# Patient Record
Sex: Male | Born: 1950 | Race: Black or African American | Hispanic: No | Smoking: Former smoker
Health system: Southern US, Community
[De-identification: ages and names within clinical notes are randomized; demographics above are authoritative.]

## PROBLEM LIST (undated history)

## (undated) DIAGNOSIS — J449 Chronic obstructive pulmonary disease, unspecified: Secondary | ICD-10-CM

## (undated) DIAGNOSIS — H269 Unspecified cataract: Secondary | ICD-10-CM

## (undated) DIAGNOSIS — I6529 Occlusion and stenosis of unspecified carotid artery: Secondary | ICD-10-CM

## (undated) DIAGNOSIS — E785 Hyperlipidemia, unspecified: Secondary | ICD-10-CM

## (undated) DIAGNOSIS — D573 Sickle-cell trait: Secondary | ICD-10-CM

## (undated) DIAGNOSIS — I2510 Atherosclerotic heart disease of native coronary artery without angina pectoris: Secondary | ICD-10-CM

## (undated) DIAGNOSIS — J302 Other seasonal allergic rhinitis: Secondary | ICD-10-CM

## (undated) DIAGNOSIS — M1990 Unspecified osteoarthritis, unspecified site: Secondary | ICD-10-CM

## (undated) DIAGNOSIS — D649 Anemia, unspecified: Secondary | ICD-10-CM

## (undated) DIAGNOSIS — I10 Essential (primary) hypertension: Secondary | ICD-10-CM

## (undated) DIAGNOSIS — I219 Acute myocardial infarction, unspecified: Secondary | ICD-10-CM

## (undated) HISTORY — DX: Hyperlipidemia, unspecified: E78.5

## (undated) HISTORY — PX: HERNIA REPAIR: SHX51

## (undated) HISTORY — DX: Essential (primary) hypertension: I10

## (undated) HISTORY — DX: Unspecified cataract: H26.9

## (undated) HISTORY — DX: Occlusion and stenosis of unspecified carotid artery: I65.29

## (undated) HISTORY — PX: APPENDECTOMY: SHX54

## (undated) HISTORY — DX: Sickle-cell trait: D57.3

## (undated) HISTORY — DX: Other seasonal allergic rhinitis: J30.2

## (undated) HISTORY — PX: STENT PLACEMENT VASCULAR (ARMC HX): HXRAD1737

## (undated) HISTORY — DX: Unspecified osteoarthritis, unspecified site: M19.90

## (undated) HISTORY — PX: KNEE ARTHROSCOPY WITH ANTERIOR CRUCIATE LIGAMENT (ACL) REPAIR: SHX5644

---

## 1998-07-13 ENCOUNTER — Ambulatory Visit (HOSPITAL_COMMUNITY): Admission: RE | Admit: 1998-07-13 | Discharge: 1998-07-13 | Payer: Self-pay | Admitting: Family Medicine

## 1998-07-13 ENCOUNTER — Encounter: Payer: Self-pay | Admitting: Family Medicine

## 1999-01-05 ENCOUNTER — Encounter (HOSPITAL_BASED_OUTPATIENT_CLINIC_OR_DEPARTMENT_OTHER): Payer: Self-pay | Admitting: General Surgery

## 1999-01-06 ENCOUNTER — Ambulatory Visit (HOSPITAL_COMMUNITY): Admission: RE | Admit: 1999-01-06 | Discharge: 1999-01-06 | Payer: Self-pay | Admitting: General Surgery

## 2001-05-30 ENCOUNTER — Encounter: Payer: Self-pay | Admitting: Nephrology

## 2001-05-30 ENCOUNTER — Encounter: Admission: RE | Admit: 2001-05-30 | Discharge: 2001-05-30 | Payer: Self-pay | Admitting: Nephrology

## 2002-06-05 ENCOUNTER — Inpatient Hospital Stay (HOSPITAL_COMMUNITY): Admission: EM | Admit: 2002-06-05 | Discharge: 2002-06-07 | Payer: Self-pay | Admitting: Cardiology

## 2002-07-16 ENCOUNTER — Inpatient Hospital Stay (HOSPITAL_COMMUNITY): Admission: AD | Admit: 2002-07-16 | Discharge: 2002-07-17 | Payer: Self-pay | Admitting: Endocrinology

## 2002-07-17 ENCOUNTER — Encounter: Payer: Self-pay | Admitting: Endocrinology

## 2002-10-02 ENCOUNTER — Encounter: Admission: RE | Admit: 2002-10-02 | Discharge: 2002-10-02 | Payer: Self-pay | Admitting: Internal Medicine

## 2002-10-02 ENCOUNTER — Encounter: Payer: Self-pay | Admitting: Internal Medicine

## 2002-10-24 ENCOUNTER — Ambulatory Visit (HOSPITAL_BASED_OUTPATIENT_CLINIC_OR_DEPARTMENT_OTHER): Admission: RE | Admit: 2002-10-24 | Discharge: 2002-10-24 | Payer: Self-pay | Admitting: *Deleted

## 2002-10-24 ENCOUNTER — Encounter: Payer: Self-pay | Admitting: Emergency Medicine

## 2002-10-24 ENCOUNTER — Emergency Department (HOSPITAL_COMMUNITY): Admission: EM | Admit: 2002-10-24 | Discharge: 2002-10-24 | Payer: Self-pay | Admitting: Emergency Medicine

## 2003-04-01 ENCOUNTER — Encounter: Payer: Self-pay | Admitting: Emergency Medicine

## 2003-04-01 ENCOUNTER — Emergency Department (HOSPITAL_COMMUNITY): Admission: EM | Admit: 2003-04-01 | Discharge: 2003-04-01 | Payer: Self-pay | Admitting: Emergency Medicine

## 2003-04-02 ENCOUNTER — Encounter: Payer: Self-pay | Admitting: Orthopedic Surgery

## 2003-04-02 ENCOUNTER — Inpatient Hospital Stay (HOSPITAL_COMMUNITY): Admission: AD | Admit: 2003-04-02 | Discharge: 2003-04-06 | Payer: Self-pay | Admitting: Orthopedic Surgery

## 2003-09-03 ENCOUNTER — Inpatient Hospital Stay (HOSPITAL_COMMUNITY): Admission: EM | Admit: 2003-09-03 | Discharge: 2003-09-04 | Payer: Self-pay | Admitting: Emergency Medicine

## 2003-09-24 ENCOUNTER — Ambulatory Visit (HOSPITAL_COMMUNITY): Admission: RE | Admit: 2003-09-24 | Discharge: 2003-09-24 | Payer: Self-pay | Admitting: Cardiology

## 2006-09-06 ENCOUNTER — Ambulatory Visit: Payer: Self-pay | Admitting: Cardiology

## 2006-09-19 ENCOUNTER — Ambulatory Visit: Payer: Self-pay

## 2006-10-18 ENCOUNTER — Ambulatory Visit: Payer: Self-pay | Admitting: Cardiology

## 2007-01-29 ENCOUNTER — Ambulatory Visit (HOSPITAL_COMMUNITY): Admission: RE | Admit: 2007-01-29 | Discharge: 2007-01-29 | Payer: Self-pay | Admitting: Orthopedic Surgery

## 2007-05-17 ENCOUNTER — Emergency Department (HOSPITAL_COMMUNITY): Admission: EM | Admit: 2007-05-17 | Discharge: 2007-05-17 | Payer: Self-pay | Admitting: Emergency Medicine

## 2007-05-31 ENCOUNTER — Emergency Department (HOSPITAL_COMMUNITY): Admission: EM | Admit: 2007-05-31 | Discharge: 2007-06-01 | Payer: Self-pay | Admitting: Emergency Medicine

## 2007-09-17 ENCOUNTER — Emergency Department (HOSPITAL_COMMUNITY): Admission: EM | Admit: 2007-09-17 | Discharge: 2007-09-17 | Payer: Self-pay | Admitting: Emergency Medicine

## 2008-06-22 ENCOUNTER — Inpatient Hospital Stay (HOSPITAL_COMMUNITY): Admission: AD | Admit: 2008-06-22 | Discharge: 2008-06-25 | Payer: Self-pay | Admitting: Cardiology

## 2008-07-02 ENCOUNTER — Encounter (HOSPITAL_COMMUNITY): Admission: RE | Admit: 2008-07-02 | Discharge: 2008-09-14 | Payer: Self-pay | Admitting: Cardiology

## 2009-01-26 ENCOUNTER — Observation Stay (HOSPITAL_COMMUNITY): Admission: AD | Admit: 2009-01-26 | Discharge: 2009-02-03 | Payer: Self-pay | Admitting: Cardiology

## 2009-01-28 ENCOUNTER — Encounter: Payer: Self-pay | Admitting: Cardiology

## 2009-02-10 ENCOUNTER — Ambulatory Visit (HOSPITAL_COMMUNITY): Admission: RE | Admit: 2009-02-10 | Discharge: 2009-02-10 | Payer: Self-pay | Admitting: Cardiology

## 2009-06-07 ENCOUNTER — Observation Stay (HOSPITAL_COMMUNITY): Admission: AD | Admit: 2009-06-07 | Discharge: 2009-06-08 | Payer: Self-pay | Admitting: Cardiology

## 2009-06-07 ENCOUNTER — Inpatient Hospital Stay (HOSPITAL_BASED_OUTPATIENT_CLINIC_OR_DEPARTMENT_OTHER): Admission: RE | Admit: 2009-06-07 | Discharge: 2009-06-07 | Payer: Self-pay | Admitting: Cardiology

## 2010-03-31 ENCOUNTER — Encounter: Admission: RE | Admit: 2010-03-31 | Discharge: 2010-03-31 | Payer: Self-pay | Admitting: Cardiology

## 2010-10-09 ENCOUNTER — Encounter: Payer: Self-pay | Admitting: Cardiology

## 2010-12-23 LAB — PROTIME-INR
INR: 0.9 (ref 0.00–1.49)
INR: 3 — ABNORMAL HIGH (ref 0.00–1.49)
Prothrombin Time: 31 seconds — ABNORMAL HIGH (ref 11.6–15.2)

## 2010-12-23 LAB — COMPREHENSIVE METABOLIC PANEL
ALT: 38 U/L (ref 0–53)
ALT: 40 U/L (ref 0–53)
AST: 36 U/L (ref 0–37)
Albumin: 3.2 g/dL — ABNORMAL LOW (ref 3.5–5.2)
Albumin: 3.5 g/dL (ref 3.5–5.2)
Alkaline Phosphatase: 81 U/L (ref 39–117)
Alkaline Phosphatase: 86 U/L (ref 39–117)
GFR calc Af Amer: >60 mL/min (ref >60)
Glucose, Bld: 117 mg/dL — ABNORMAL HIGH (ref 70–99)
Potassium: 3.9 mEq/L (ref 3.5–5.1)
Potassium: 4 mEq/L (ref 3.5–5.1)
Sodium: 139 mEq/L (ref 135–145)
Sodium: 139 mEq/L (ref 135–145)
Total Protein: 6.1 g/dL (ref 6.0–8.3)
Total Protein: 6.6 g/dL (ref 6.0–8.3)

## 2010-12-23 LAB — CBC
HCT: 35.6 % — ABNORMAL LOW (ref 39.0–52.0)
Hemoglobin: 12.1 g/dL — ABNORMAL LOW (ref 13.0–17.0)
Hemoglobin: 14.3 g/dL (ref 13.0–17.0)
MCV: 88 fL (ref 78.0–100.0)
Platelets: 188 10*3/uL (ref 150–400)
RBC: 4.05 MIL/uL — ABNORMAL LOW (ref 4.22–5.81)
RDW: 14.9 % (ref 11.5–15.5)
WBC: 7.8 10*3/uL (ref 4.0–10.5)
WBC: 8.6 10*3/uL (ref 4.0–10.5)

## 2010-12-23 LAB — CARDIAC PANEL(CRET KIN+CKTOT+MB+TROPI)
CK, MB: 1.3 ng/mL (ref 0.3–4.0)
CK, MB: 1.6 ng/mL (ref 0.3–4.0)
CK, MB: 1.9 ng/mL (ref 0.3–4.0)
Total CK: 60 U/L (ref 7–232)
Total CK: 88 U/L (ref 7–232)

## 2010-12-23 LAB — LIPID PANEL
HDL: 29 mg/dL — ABNORMAL LOW (ref >39)
Total CHOL/HDL Ratio: 2.7 RATIO

## 2010-12-23 LAB — BRAIN NATRIURETIC PEPTIDE: Pro B Natriuretic peptide (BNP): 44 pg/mL (ref 0.0–100.0)

## 2010-12-27 LAB — RAPID URINE DRUG SCREEN, HOSP PERFORMED
Amphetamines: NOT DETECTED
Barbiturates: NOT DETECTED
Benzodiazepines: NOT DETECTED
Opiates: POSITIVE — AB

## 2010-12-27 LAB — BASIC METABOLIC PANEL
BUN: 8 mg/dL (ref 6–23)
CO2: 28 mEq/L (ref 19–32)
Calcium: 8.9 mg/dL (ref 8.4–10.5)
Chloride: 99 mEq/L (ref 96–112)
Creatinine, Ser: 0.89 mg/dL (ref 0.4–1.5)
Creatinine, Ser: 0.73 mg/dL (ref 0.4–1.5)
GFR calc Af Amer: >60 mL/min (ref >60)
GFR calc Af Amer: >60 mL/min (ref >60)
GFR calc non Af Amer: >60 mL/min (ref >60)
Potassium: 4.5 mEq/L (ref 3.5–5.1)

## 2010-12-27 LAB — COMPREHENSIVE METABOLIC PANEL
ALT: 23 U/L (ref 0–53)
BUN: 13 mg/dL (ref 6–23)
CO2: 28 mEq/L (ref 19–32)
Calcium: 9.8 mg/dL (ref 8.4–10.5)
Creatinine, Ser: 1.19 mg/dL (ref 0.4–1.5)
GFR calc non Af Amer: >60 mL/min (ref >60)
Glucose, Bld: 97 mg/dL (ref 70–99)

## 2010-12-27 LAB — CBC
HCT: 38.8 % — ABNORMAL LOW (ref 39.0–52.0)
HCT: 37.4 % — ABNORMAL LOW (ref 39.0–52.0)
MCHC: 33.7 g/dL (ref 30.0–36.0)
MCV: 86 fL (ref 78.0–100.0)
MCV: 87.1 fL (ref 78.0–100.0)
Platelets: 205 10*3/uL (ref 150–400)
Platelets: 204 10*3/uL (ref 150–400)
RBC: 4.51 MIL/uL (ref 4.22–5.81)
RBC: 4.29 MIL/uL (ref 4.22–5.81)
RDW: 13.3 % (ref 11.5–15.5)
WBC: 9.4 10*3/uL (ref 4.0–10.5)
WBC: 8.9 10*3/uL (ref 4.0–10.5)
WBC: 10.8 10*3/uL — ABNORMAL HIGH (ref 4.0–10.5)

## 2010-12-27 LAB — CARDIAC PANEL(CRET KIN+CKTOT+MB+TROPI)
CK, MB: 0.6 ng/mL (ref 0.3–4.0)
Relative Index: INVALID (ref 0.0–2.5)
Total CK: 106 U/L (ref 7–232)
Total CK: 81 U/L (ref 7–232)
Troponin I: 0.02 ng/mL (ref 0.00–0.06)
Troponin I: 0.02 ng/mL (ref 0.00–0.06)

## 2010-12-27 LAB — DIFFERENTIAL
Basophils Absolute: 0.1 10*3/uL (ref 0.0–0.1)
Basophils Relative: 1 % (ref 0–1)
Monocytes Relative: 10 % (ref 3–12)
Neutro Abs: 6.4 10*3/uL (ref 1.7–7.7)
Neutrophils Relative %: 68 % (ref 43–77)

## 2010-12-27 LAB — LIPASE, BLOOD
Lipase: 46 U/L (ref 11–59)
Lipase: 45 U/L (ref 11–59)

## 2010-12-27 LAB — LIPID PANEL
HDL: 59 mg/dL (ref >39)
Total CHOL/HDL Ratio: 2.4 RATIO
Triglycerides: 55 mg/dL (ref <150)

## 2010-12-27 LAB — PROTIME-INR
INR: 0.9 (ref 0.00–1.49)
Prothrombin Time: 12.7 seconds (ref 11.6–15.2)

## 2010-12-27 LAB — APTT: aPTT: 28 seconds (ref 24–37)

## 2010-12-27 LAB — HEPARIN LEVEL (UNFRACTIONATED): Heparin Unfractionated: 0.64 IU/mL (ref 0.30–0.70)

## 2010-12-27 LAB — AMYLASE: Amylase: 216 U/L — ABNORMAL HIGH (ref 27–131)

## 2011-01-31 NOTE — Discharge Summary (Signed)
NAME:  [name] [name], POTVIN NO.:  [id number]   MEDICAL RECORD NO.:  [id number]          PATIENT TYPE:  INP   LOCATION:  6529                         FACILITY:  [facility]   PHYSICIAN:  Mohan N. Sharyn Lull, M.D. DATE OF BIRTH:  1950/10/21   DATE OF ADMISSION:  06/22/2008  DATE OF DISCHARGE:  06/25/2008                               DISCHARGE SUMMARY   ADMITTING DIAGNOSES:  1. Unstable angina, rule out myocardial infarction, and rule out      progression of coronary artery disease.  2. Coronary artery disease.  3. History of percutaneous coronary intervention to left anterior      descending x2 in the past.  4. Uncontrolled hypertension.  5. Non-insulin-dependent diabetes mellitus, controlled by diet.  6. Hypercholesteremia.  7. Tobacco abuse.  8. 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   DISCHARGE DIAGNOSES:  1. Status post unstable angina.  2. Status post left cath and percutaneous transluminal coronary      angioplasty stenting to mid left anterior descending.  3. Minimally elevated troponin I probably secondary to plaque shift to      very small septal perforator which is less than 0.25 mm. I doubt      any significant myocardial infarction.  4. Coronary artery disease.  5. History of percutaneous coronary intervention to left anterior      descending x2 in the past.  6. Hypertension.  7. Glucose intolerance.  8. Hypercholesteremia.  9. History of tobacco abuse.  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   DISCHARGE MEDICATIONS:  1. Enteric-coated aspirin 325 mg 1 tablet daily.  2. Plavix 75 mg 1 tablet daily with food.  3. Toprol-XL 50 mg 1 tablet daily.  4. Ramipril 5 mg 1 capsule daily.  5. Crestor 20 mg 1 tablet daily.  6. Nitrolingual spray 0.4 mg sublingual use as directed.   DIET:  Low salt, low cholesterol.  The patient has been advised to avoid  sweets.   ACTIVITY:  Increase activity slowly.  Avoid any lifting, pushing, or  pulling for 48 hours.   Postcath and PTCA instructions have been given.  The patient will be  scheduled for phase 2 Cardiac Rehab as an outpatient.  Follow up with me  in 1 week.   CONDITION AT DISCHARGE:  Stable.   BRIEF HISTORY AND HOSPITAL COURSE:  Mr. [name] [name] is a 60 year old black  male with past medical history significant for coronary artery disease,  history of PCI to LAD x2 in the past, hypertension, non-insulin-  dependent diabetes mellitus controlled by diet, hypercholesteremia,  tobacco abuse, alcohol abuse, and history of marijuana and cocaine abuse  in the past.  He came to the office complaining of retrosternal and  precordial chest pain off and on grade 7/10 for the last few weeks  radiating to the left arm and neck.  He states initially did not seek  any medical attention, but as chest pain got worse, so decided to come  to the office.  Denies any shortness of breath.  Denies nausea,  vomiting, but complains of occasional diaphoresis.  Denies palpitation,  lightheadedness, or syncope.  States he has stopped all these  medications in January 2009, except he takes aspirin off and on.   PAST MEDICAL HISTORY:  As above.   PAST SURGICAL HISTORY:  He had right inguinal hernia repair in the past,  left knee and left foot surgery in the past.   ALLERGIES:  No known drug allergies.   MEDICATION AT HOME:  He was on aspirin, Plavix, metoprolol, and  simvastatin.   SOCIAL HISTORY:  He is separated, 7 children.  Smokes half pack per day  for 20 plus years.  Drinks hard liquor 3-4 times per week.  Smoked  marijuana and cocaine in the past and last use was 3-4 weeks ago.   FAMILY HISTORY:  Positive for coronary artery disease, hypertension, and  diabetes.   PHYSICAL EXAMINATION:  GENERAL:  He is alert, awake, and oriented x3, in  no acute distress.  VITAL SIGNS:  Blood pressure was 140/90 and pulse was 70.  HEENT:  Conjunctivae was pink.  NECK:  Supple.  No JVD.  No  bruit.  LUNGS:  Clear to auscultation without rhonchi or rales.  CARDIOVASCULAR:  S1 and S2 was normal.  There was soft S4 gallop and  systolic murmur.  ABDOMEN:  Soft.  Bowel sounds were present.  Nontender.  EXTREMITIES:  There is no clubbing, cyanosis, or edema.   LABORATORY DATA:  Chest x-ray showed no acute abnormalities.  EKG showed  normal sinus rhythm with early repolarization changes.  Postprocedure  EKG showed sinus bradycardia with early repolarization.  Repeat EKG  showed septal Q-waves on June 24, 2008.  On June 25, 2008, repeat  EKG showed normalization of septal Q-waves.  There is no evidence of  septal Q-waves.  There is normal R-wave progression and V1-V3 with early  repolarization changes and peak T-waves as before.   OTHER LABORATORIES:  His hemoglobin was 12.9, hematocrit 40, white count  of 8.3, and platelet count of 203,000.  Glucose was 120, BUN was 3,  creatinine 0.98, and potassium was 3.2.  Repeat potassium was 3.8,  glucose fasting was 96, BUN 6, and creatinine 1.03.  Cholesterol was  127, LDL 36, HDL of 42, and triglyceride 245.  His C-reactive protein  was 0.1.  His postprocedure CPK was 137, MB of 6.1, and troponin I was  0.57.  Today, his CPK is 122, MB has come down to 2.7, and troponin I  still minimally elevated at 0.75.   BRIEF HOSPITAL COURSE:  The patient was admitted to telemetry unit.  MI  was ruled out by serial enzymes and EKG.  The patient subsequently  underwent left cath and PTCA stenting to mid LAD as per procedure  report.  The patient tolerated procedure well.  There were no  complications.  The patient did have some chest pain postprocedure.  The  patient had a very small septal perforator, which was less than 0.25 mm,  and had sluggish flow in one of the septal perforator postprocedure with  elevation of minimally elevated troponin I with normal CPKs.  EKG  initially showed septal Q-waves, which resolved next day.  The patient  has  been ambulating in hallway without any problems.  His groin is  stable with no evidence of hematoma or bruit.  The patient will be  discharged home on above medications and will be followed up in my  office in 1 week.  The patient will be scheduled for phase 2 Cardiac  Rehab as an outpatient.  The patient was concerned for smoking cessation  and drug cessation to which he agrees, and the patient was started on  Nicoderm patches, which he is tolerating well.  The patient will be  discharged home on above medications and will be followed up in my  office next week.      Eduardo Osier. Sharyn Lull, M.D.  Electronically Signed     MNH/MEDQ  D:  06/25/2008  T:  06/25/2008  Job:  [id number]

## 2011-01-31 NOTE — Discharge Summary (Signed)
NAME:  [name] [name], [name] [name] NO.:  [id number]   MEDICAL RECORD NO.:  [id number]          PATIENT TYPE:  INP   LOCATION:  3707                         FACILITY:  [facility]   PHYSICIAN:  Mohan N. Sharyn Lull, M.D. DATE OF BIRTH:  [birth year]   DATE OF ADMISSION:  01/28/2009  DATE OF DISCHARGE:  02/03/2009                               DISCHARGE SUMMARY   ADMITTING DIAGNOSES:  Chest pain, history of recent cocaine abuse rule  out myocardial infarction, coronary artery disease, history of  percutaneous coronary intervention to left anterior descending in the  past, hypertension, hypercholesteremia, tobacco abuse, alcohol abuse.   DISCHARGED DIAGNOSES:  Status post chest pain myocardial infarction  ruled out, negative Persantine Myoview, history of recent cocaine abuse,  resolving bilateral pneumonia, probable resolving pancreatitis, coronary  artery disease, stable, hypertension, hypercholesteremia, history of  tobacco abuse and cocaine abuse and alcohol abuse.   DISCHARGE MEDICATIONS:  1. Enteric-coated aspirin 81 mg 1 tablet daily.  2. Plavix 75 mg 1 tablet daily.  3. Cardizem CD 180 mg 1 capsule daily in the morning.  4. Ramipril 5 mg 1 capsule daily.  5. Crestor 20 mg 1 tablet daily.  6. Nitrostat 0.4 mg sublingual use as directed.  7. Avelox 400 mg 1 tablet daily for seven more days.   DIET:  Low salt, low cholesterol.   ACTIVITY:  Increase activity as tolerated.   FOLLOW UP:  With me in 1 week.   CONDITION AT DISCHARGE:  Stable.   BRIEF HISTORY AND HOSPITAL COURSE:  [name] is a 61 year old black  male with past medical history significant for coronary artery disease  status post PCI to LAD, hypertension, hypercholesteremia, tobacco abuse,  alcohol abuse, history of marijuana and cocaine abuse.  He came to the  office complaining of left-sided chest pain off and on radiating to the  left arm associated with mild shortness of breath.  He states the chest  pain  occasionally increases with deep breathing.  He smoked cocaine last  week and denies any nausea, vomiting, diaphoresis.  Denies palpitation,  lightheadedness or syncope.  Denies PND, orthopnea, leg swelling.   PAST MEDICAL HISTORY:  As above.   PAST SURGICAL HISTORY:  He had left knee surgery in the past and right  inguinal hernia repair in the past and PTCA stenting to LAD in the past.   ALLERGIES:  No known drug allergies.   MEDICATION AT HOME:  He was on:  1. Enteric-coated aspirin 325 mg p.o. daily.  2. Plavix 75 mg p.o. daily.  3. Toprol-XL 50 mg p.o. daily.  4. Ramipril 5 mg p.o. daily.  5. Crestor 20 mg p.o. daily.  6. Nitrostat sublingual p.r.n.   SOCIAL HISTORY:  He is married, 7 children.  Smoked half pack per day  for 20+ years.  Drinks hard liquor on weekends and snorted cocaine last  weekend.  He works as a Engineer, civil (consulting).   FAMILY HISTORY:  Positive for coronary artery disease.   PHYSICAL EXAMINATION:  GENERAL:  He is alert, awake, oriented x3 in no  acute distress.  VITAL SIGNS:  Blood  pressure is 140/84, pulse was 98 and regular.  HEENT:  Conjunctivae was pink.  NECK:  Supple, no JVD.  LUNGS:  Clear to auscultation without rhonchi or rales.  CARDIOVASCULAR:  S1 and S2 was normal.  There was soft systolic murmur  and S4 gallop.  ABDOMEN:  Soft.  Bowel sounds were present, nontender.  EXTREMITIES:  There is no clubbing, cyanosis or edema.   LABORATORY DATA:  His hemoglobin was 13.1, hematocrit 39, white count of  12.2.  Three sets of cardiac enzymes were normal.  Sodium was 139,  potassium 4.3, glucose 97, BUN 13, creatinine 1.19.  Urine drug screen  was negative for cocaine.  His lipase was 34, amylase was slightly  elevated to 15.  Repeat CBC on Feb 01, 2009 hemoglobin 13.1, hematocrit  38.8, white count of 9.4.  His chest x-ray on Jan 26, 2009 showed very  low lung volumes with vascular crowding, and bibasilar atelectasis.  The  patient had a CT of abdomen which  suggested left greater than right  lower lobe collapse with consolidation with atelectasis and small left  parapneumonic effusion suggestive of infection.  CT of the abdomen  otherwise was normal.  CT of pelvis was normal.  Repeat x-ray on Jan 30, 2009 showed left lower lobe opacity which represents combination of  airspace disease and atelectasis and also platelike atelectasis in the  right lower lobe.  Repeat x-ray  yesterday on Feb 02, 2009 showed  significant improvement in bibasilar airspace opacities compared to  prior exam only minimal residual subsegmental atelectasis noted.  Nearly  resolved left pleural effusion.  Persantine Myoview on Jan 28, 2009  showed no evidence of myocardial ischemia or infarction, normal left  ventricular ejection fraction of 64% with normal wall motion.   BRIEF HOSPITAL COURSE:  The patient was admitted to telemetry unit.  The  patient had recurrent episodes of chest pain and was transferred to CCU.  MI was ruled out by serial enzymes and EKG.  The patient subsequently  underwent Persantine Myoview which showed no evidence of reversible  ischemia as above.  The patient developed abdominal pain and left-sided  pleuritic chest pain, CT of the abdomen and pelvis and chest x-ray  showed bilateral consolidation.  Was started on IV antibiotics with  improvement in his symptoms.  The patient remained afebrile  during the hospital stay.  IV antibiotics has been switched to oral  Avelox.  The patient will be discharged home on above medications and  will be followed up in my office in 1 week and repeat the chest x-ray as  an outpatient in 1 week.  The patient has been advised to stay away from  smoking and cocaine which he agrees.      Eduardo Osier. Sharyn Lull, M.D.  Electronically Signed     MNH/MEDQ  D:  02/03/2009  T:  02/03/2009  Job:  [id number]

## 2011-01-31 NOTE — Op Note (Signed)
NAME:  [name] [name], [name] [name] NO.:  [id number]   MEDICAL RECORD NO.:  [id number]          PATIENT TYPE:  INP   LOCATION:  6529                         FACILITY:  [facility]   PHYSICIAN:  Mohan N. Sharyn Lull, M.D. DATE OF BIRTH:  Feb 13, 1951   DATE OF PROCEDURE:  06/23/2008  DATE OF DISCHARGE:  04/24/2008                               OPERATIVE REPORT   PROCEDURE:  1. Left cardiac cath with selective left and right coronary      angiography.  Left ventricle graft via right groin using Judkins      technique.  2. Successful percutaneous transluminal coronary angioplasty to mid      left anterior descending using 2.5 x 8 mm long VOYAGER balloon.  3. Successful deployment of 3.0 x 13 mm long CYPHER drug-eluting stent      in mid left anterior descending.  4. Successful postdilatation of the stent using 3.25 x 12 mm long [place]      VOYAGER balloon.   INDICATION FOR THE PROCEDURE:  [name] [name] is a 59 year old black male  with past medical history significant for coronary artery disease status  post PTCA stenting to LAD x2 in the past.  Hypertension, non-insulin-  dependent diabetes mellitus controlled by diet, hypercholesteremia,  tobacco abuse, alcohol abuse, history of marijuana and cocaine abuse in  the past.  He came to the office complaining of retrosternal and  precordial chest pain off and on, grade 7/10 for the last few weeks,  radiating to the left arm and neck.  He states initially did not seek  any medical attention, but as chest pain got worse and more frequent, so  decided to come to the office.  Denies any shortness of breath.  Denies  nausea or vomiting, but complains of occasional diaphoresis.  Denies  palpitation, lightheadedness, or syncope.  He states he stopped all his  medications in January 2009, now only occasionally he takes aspirin.   PAST MEDICAL HISTORY:  As above.   PAST SURGICAL HISTORY:  He had right inguinal hernia repair in the past.  Had left knee  surgery in the past and left foot surgery in the past.   ALLERGIES:  No known drug allergies.   MEDICATIONS AT HOME:  He was on aspirin, Plavix, metoprolol, and  simvastatin, which he stops.   SOCIAL HISTORY:  He is separated.  Has 7 children.  Smoked half pack per  day for 20 plus years and drinks hard liquor 3-4 times per week.  Smoked  marijuana and cocaine 3-4 weeks ago.   FAMILY HISTORY:  Positive for coronary artery disease, hypertension, and  diabetes.   PHYSICAL EXAMINATION:  GENERAL:  He was alert, awake, and oriented x3 in  no acute distress.  VITAL SIGNS:  Blood pressure was 140/90 and pulse was 70, regular.  EYES:  Conjunctivae was pink.  NECK:  Supple.  No JVD.  No bruit.  LUNGS:  Clear to auscultation without rhonchi or rales.  CARDIOVASCULAR:  S1 and S2 was normal.  There was soft systolic murmur  and S4 gallop.  ABDOMEN:  Soft.  Bowel  sounds were present.  Nontender.  EXTREMITIES:  There was no clubbing, cyanosis, or edema.   BRIEF HOSPITAL COURSE:  The patient was admitted to telemetry unit, MI  was ruled out by serial enzymes and EKG.  Discussed with the patient at  length regarding non-invasive stress testing versus left cath possible  PTCA stenting, its risks and benefits, i.e., death, MI, stroke, need for  emergency CABG, risk of restenosis, local vascular complications, etc.,  and consented for the procedure.   PROCEDURE:  After obtaining the informed consent, the patient was  brought to the cath lab and was placed on fluoroscopic table.  Right  groin was prepped and draped in usual fashion.  Xylocaine 2% was used  for local anesthesia in the right groin.  With the help of thin wall  needle, 6-French arterial sheath was placed.  The sheath was aspirated  and flushed.  Next, 6-French left Judkins catheter was advanced over the  wire under fluoroscopy guidance up to the ascending aorta.  Wire was  pulled out, the catheter was aspirated and connected to the  manifold.  The catheter was further advanced and engaged into left coronary ostium.  Multiple views of the left system were taken.  Next, catheter was  disengaged and was pulled out over the wire and was placed with 6-  [place].  Right Judkins catheter, which was advanced over the wire under  fluoroscopy guidance up to the ascending aorta.  The wire was pulled  out.  The catheter was aspirated and connected to the manifold.  The  catheter was further advanced across the aortic valve into the LV.  LV  pressures were recorded.  Next, LV graft was 130 degree RAO position.  Post angiographic pressures were recorded from LV and then pull back  pressures were recorded from the aorta.  There was no gradient across  the aortic valve.  Next, a pigtail catheter was pulled out over the  wire.  Sheaths were aspirated and flushed.   FINDINGS:  LV showed good LV systolic function.  EF of 55-60%.  Left  main was patent.  LAD has 75-80% mid stenosis at the distal edge of the  stent with haziness with TIMI grade 3 distal flow.  Diagonal 1 to 3 were  very-very small.  [place] was moderate sized, which was patent.  Left  circumflex was patent.  OM1 and OM2 were very very small.  OM3 was  moderate sized, which has 20% proximal stenosis.  OM4 was moderate  sized, which was patent.  RCA was patent.   INTERVENTIONAL PROCEDURES:  Successful PTCA to mid LAD was done using  2.5 x 8 mm long VOYAGER balloon for predilatation and then 3.0 x 13 mm  long CYPHER drug-eluting stent was deployed at 13 atmospheric pressure  and mid LAD overlapping over the distal edge of the stent.  The stent  was postdilated using 3.25 x 12 mm long [place] VOYAGER balloon going up to  18 atmospheric pressure.  Lesion was dilated from 75-80% to 0% residual  with excellent TIMI grade 3 distal flow without evidence of dissection  or distal embolization.  The patient received weight-based Angiomax and  300 mg of Plavix during the procedure.  The  patient tolerated the  procedure well.  There were no complications.  The patient was  transferred to recovery room in stable condition.      [name] [name]. Sharyn Lull, M.D.  Electronically Signed     MNH/MEDQ  D:  06/23/2008  T:  06/24/2008  Job:  [id number]   cc:   Cath Lab

## 2011-02-03 NOTE — Cardiovascular Report (Signed)
NAME:  [name] [name], [name] [name] NO.:  [id number]   MEDICAL RECORD NO.:  [id number]                   PATIENT TYPE:  INP   LOCATION:  4706                                 FACILITY:  [facility]   PHYSICIAN:  Salvadore Farber, M.D. [facility]         DATE OF BIRTH:  [birth year]   DATE OF PROCEDURE:  07/16/2002  DATE OF DISCHARGE:                              CARDIAC CATHETERIZATION   PROCEDURES:  Coronary angiography, left heart catheterization, left  ventriculography.   INDICATIONS:  The patient is a 60 year old gentleman, status post urgent  stenting of his mid LAD on June 05, 2002. He now presents with  recurrent chest pain which has culminated in ongoing left-sided chest  discomfort today.  Electrocardiogram demonstrates anterior ST elevations,  which are unchanged from his convalescent electrocardiogram demonstrating  normal early repolarization.  Due to his ongoing chest pain and recent  coronary stent, he is referred for emergent diagnostic angiography.   DIAGNOSTIC TECHNIQUE:  Informed consent was obtained. Under 1% lidocaine  local anesthesia, a 6 French sheath was placed in the right femoral artery  using the modified Seldinger technique.  Diagnostic angiography and  ventriculography were performed using JL4, JR4, and pigtail catheters.  The  arteriotomy was closed using a 6 [place] Perclose device.  The patient  tolerated the procedure well and was transferred to the holding room in  stable condition.   COMPLICATIONS:  None.   FINDINGS:  1. Left main:  Angiographically normal.  2. LAD:  The LAD is a very large vessel giving rise to a single large     diagonal branch.  The mid LAD stent is widely patent.  There is mild     tapering of the vessel leading into the stent which is unchanged compared     to his prior angiogram. This tapering did not change with the     administration of intracoronary nitroglycerin.  3. Circumflex:  The circumflex is a  moderate sized vessel giving rise to two     obtuse marginal branches.  It is angiographically normal.  4. RCA:  The RCA is the dominant vessel.  It is angiographically normal.  5. EF equaled 57% without regional wall motion abnormality.  6. LV:  106/3/8.  7. No aortic stenosis or mitral regurgitation.    IMPRESSION/PLAN:  The patient has a widely patent left anterior descending  stent and no other coronary artery disease.  We will admit him cycle serial  enzymes to rule out transient thrombosis resulting in myocardial infarction.  I think this very unlikely and he likely has a noncardiac etiology to his  chest pain syndrome.                                                Salvadore Farber, M.D.  [facility]    WED/MEDQ  D:  07/16/2002  T:  07/17/2002  Job:  [id number]   cc:   Stacie Glaze, M.D. [facility]   Sean A. Everardo All, M.D. [facility]

## 2011-02-03 NOTE — Cardiovascular Report (Signed)
NAME:  [name] [name], [name] [name] NO.:  [id number]   MEDICAL RECORD NO.:  [id number]                   PATIENT TYPE:  INP   LOCATION:  6524                                 FACILITY:  [facility]   PHYSICIAN:  Jonelle Sidle, M.D. [facility]        DATE OF BIRTH:  1951/05/28   DATE OF PROCEDURE:  09/03/2003  DATE OF DISCHARGE:                              CARDIAC CATHETERIZATION   PRIMARY CARE PHYSICIAN:  Dr. Cliffton Asters CARDIOLOGY:  Dr. Samule Ohm   INDICATIONS:  Mr. Monnier is a 60 year old male with known coronary artery  disease status post presentation with acute coronary syndrome back in  September 2003 with resulting stent placement to the mid left anterior  descending.  He represented in October of 2003 with recurrent chest pain and  was found to have a patent left anterior descending stent site.  He has had  difficulties with medication noncompliance over the last several months and  presents now with progressive chest discomfort culminating in an episode of  syncope over the last few days and more intense chest pain today.  He is  referred urgently to the cardiac catheterization laboratory to assess his  coronary anatomy.   PROCEDURES PERFORMED:  1. Left heart catheterization.  2. Selective coronary angiography.  3. Left ventriculography.   ACCESS AND EQUIPMENT:  The area about the right femoral artery was  anesthetized with 1% lidocaine and a 6-French sheath was placed in the right  femoral artery via the modified Seldinger technique.  Standard preformed 6-  [place] and JR4 catheters were used for selective coronary angiography  and an angled pigtail catheter was used for left heart catheterization, left  ventriculography.  All exchanges were made over a wire and the patient  tolerated the procedure well without immediate complications.   HEMODYNAMICS:  1. Left ventricle 112/12 mmHg.  2. Aorta 112/71 mmHg.   ANGIOGRAPHIC FINDINGS:  1. The left main  coronary artery shows no significant flow limiting coronary     atherosclerosis.  2. The left anterior descending has an 80% mid vessel stenosis just proximal     to the previous Cypher stent.  No other major luminal obstructions are     noted.  There was no significant change in this stenosis following 150     mcg of intracoronary nitroglycerin.  3. The circumflex coronary artery has two obtuse marginal branches, the     first of which has a proximal 30% stenosis.  4. The right coronary artery is dominant and has no significant flow     limiting coronary atherosclerosis.   LEFT VENTRICULOGRAPHY:  Left ventriculography was performed in the RAO  projection revealing an ejection fraction of approximately 50-55%.   DIAGNOSES:  1. Single vessel obstructive coronary artery disease with an 80% stenosis     just proximal to the previous Cypher stent in the mid left anterior     descending.  There was no  significant change with intracoronary     nitroglycerin.  2. Left ventricular ejection fraction of approximately 50-55%.   RECOMMENDATIONS:  I discussed the films with Dr. Juanda Chance.  Will plan  percutaneous intervention to address the left anterior descending stenosis.                                               Jonelle Sidle, M.D. [facility]    SGM/MEDQ  D:  09/03/2003  T:  09/04/2003  Job:  [id number]

## 2011-02-03 NOTE — Discharge Summary (Signed)
   NAME:  [name], [name] NO.:  [id number]   MEDICAL RECORD NO.:  [id number]                   PATIENT TYPE:  INP   LOCATION:  5027                                 FACILITY:  [facility]   PHYSICIAN:  Burnard Bunting, M.D.                 DATE OF BIRTH:  11/27/1950   DATE OF ADMISSION:  04/02/2003  DATE OF DISCHARGE:  04/06/2003                                 DISCHARGE SUMMARY   DISCHARGE DIAGNOSES:  Left foot puncture wound and infection.   SECONDARY DIAGNOSIS:  None.   OPERATIONS AND PROCEDURES:  Left foot incision and drainage April 02, 2003.   CONSULTANTS:  Infectious disease.   HOSPITAL COURSE:  [name] [name] is a 60 year old patient who received a left  puncture wound two days prior to admission. He reports increased swelling  and pain. MRI is consistent with cellulitis and dorsal fluid. The patient  was admitted to the orthopedic service and taken to the OR on April 02, 2003.  At that time, he underwent right foot incision and drainage. He tolerated  the procedure well without immediate complications. Infectious disease  consultation was obtained. Cipro was started. The patient defervesced and  did in recovery. The dorsal wound was left open and packed. Packing was  changed beginning postoperative day #2. The patient is discharged home on  April 06, 2003 to be on Cipro for two weeks.   DISCHARGE MEDICATIONS:  Include previous medications plus Vicodin for pain.   DISCHARGE INSTRUCTIONS:  He is to follow up with me in five days for recheck  on the incision. He is to maintain his weight bearing on heel only.                                                Burnard Bunting, M.D.    GSD/MEDQ  D:  04/28/2003  T:  04/29/2003  Job:  [id number]

## 2011-02-03 NOTE — H&P (Signed)
NAME:  [name] [name], LONGSHORE NO.:  [id number]   MEDICAL RECORD NO.:  [id number]                   PATIENT TYPE:  INP   LOCATION:  3742                                 FACILITY:  [facility]   PHYSICIAN:  Salvadore Farber, M.D. [facility]         DATE OF BIRTH:  12-04-1950   DATE OF ADMISSION:  06/05/2002  DATE OF DISCHARGE:                                HISTORY & PHYSICAL   CHIEF COMPLAINT:  Acute anterior myocardial infarction.   HISTORY OF PRESENT ILLNESS:  The patient is a 60 year-old man whose has had  substernal chest pain off and on over the past three weeks.  This has  radiated into his left arm.  It has not been associated with dizziness,  palpitations or nausea.  He was referred by his primary care doctor for a  stress Cardiolite today.  After the rest images were obtained, the patient  developed chest pain similar to prior. An electrocardiogram demonstrated ST  elevations in the inferior and anterior leads.  He was therefore referred  urgently for catheterization.  He was brought from the office to the [facility] laboratory by EMS.  Upon arrival in the catheterization  laboratory he was pain free, but had had chest discomfort in the ambulance.   PAST MEDICAL HISTORY:  1. Status post left knee reconstruction.  2. Hernia surgery.   MEDICATIONS:  None.   ALLERGIES:  No known drug allergies.   SOCIAL HISTORY:  The patient is married and lives with his wife in  [place]. He works as a Engineer, civil (consulting) at the [employer]  He smokes  one pack per day. He uses alcohol occasionally.  He denies illicit drug use.   FAMILY HISTORY:  Mother is alive at 61 with coronary artery disease.  His  father died at 82 with coronary artery disease.   REVIEW OF SYMPTOMS:  Negative in detail except as above.   PHYSICAL EXAMINATION:  GENERAL:  He is in moderate distress due to pain  though is not dyspneic or diaphoretic.  VITAL SIGNS: Heart rate 55, blood  pressure 106/67, weight 152 pounds.  HEENT:  He has no jugular venous distention.  LUNGS:  Clear to auscultation and percussion bilaterally.  He has a regular  rate and rhythm without murmurs, rubs or S3.  There is an S4.  ABDOMEN:  Soft, non-tender and non-distended.  There is no  hepatosplenomegaly.  Bowel sounds are normal.  EXTREMITIES:  Warm without cyanosis, clubbing or edema.   Electrocardiogram demonstrates sinus bradycardia at 55 beats per minute with  ST elevations in leads V2 to V6 as well as 2 and AVF.   LABORATORY DATA:  Stat laboratories obtained in the cardiac catheterization  laboratory are remarkable for a sodium of 139, potassium 4.1, creatinine  0.9.  Hematocrit 44.0.   IMPRESSION/PLAN:  The patient was taken to urgent coronary angiography which  revealed 80% stenosis of  his mid left anterior descending.  This was stented  with a 3.0 x 28 mm Cypher as detailed in separate dictation.  He will now be  transferred to the cardiac intensive care unit where he will be treated with  aspirin, Plavix, Integrelin, ACE inhibitor and beta blocker.  Smoking  cessation has been advised and the smoking cessation team will be consulted.  Left ventriculogram was deferred during the index procedure.  An  echocardiogram will be obtained in 24 hours to assess left ventricular  function.                                               Salvadore Farber, M.D. [facility]    WED/MEDQ  D:  06/05/2002  T:  06/07/2002  Job:  [id number]

## 2011-02-03 NOTE — H&P (Signed)
NAME:  [name] [name], [name] [name] NO.:  [id number]   MEDICAL RECORD NO.:  [id number]                   PATIENT TYPE:  INP   LOCATION:  3742                                 FACILITY:  [facility]   PHYSICIAN:  Thomas C. Wall, M.D. [facility]            DATE OF BIRTH:  09/12/1951   DATE OF ADMISSION:  06/05/2002  DATE OF DISCHARGE:                                HISTORY & PHYSICAL   CHIEF COMPLAINT:  Chest pain.   HISTORY OF PRESENT ILLNESS:  The patient is a 60 year old male patient who  was seen in the office on 06/05/02 after a stress test. He was referred for a  stress Cardiolite after three weeks of a history of substernal chest pain  which was intermittent with radiation to his left arm associated with  diaphoresis and nausea. He denied any dizziness, vomiting, or palpitations.  When he arrived for the stress Cardiolite, he began having substernal chest  pain prior to the test and he had pain during the rest images. The rest  images did reveal changes in the apical region consistent with infarct. His  EKG revealed inferior lateral ST segment elevation of  1.5 mm with ST  changes in the anterior leads. The patient  was seen and examined by Dr. Juanito Doom.   PAST MEDICAL HISTORY:  Status post left knee reconstruction, history of  hernia repair in 2000.   ALLERGIES:  None.   MEDICATIONS:  None.   SOCIAL HISTORY:  He  lives in [place] with his wife. He is married and  has a daughter. He works as a Engineer, civil (consulting) at the [employer] He  smokes one pack per day. Occasional alcohol use. No drug use. No regular  exercise except for yard work. Regular diet.   FAMILY HISTORY:  Mom alive at age 94 with history of myocardial infarction.  Dad died age 91 with history of myocardial infarction. Two sisters alive and  well. One sister died at age 6, murdered. The other sisters are age 38 and  47. He has no brothers.   REVIEW OF SYSTEMS:  As above.   PHYSICAL  EXAMINATION:  VITAL SIGNS:  Pulse 55, respirations 20, blood  pressure 106/67, height 5 feet 11 inches, weight 162.  HEENT:  Grossly normal. No carotid bruits. No JVD or thyromegaly.  CHEST:  Clear to auscultation bilaterally. No wheezing or rhonchi.  HEART:  Regular rate and rhythm. No gross murmur, rub or ectopy.  ABDOMEN:  Good bowel sounds. Nontender. Nondistended. No masses. No bruits.  EXTREMITIES:  No lower extremity edema. No femoral bruits.   PT in the office was 11.5 with an INR of  0.9.    ASSESSMENT/PLAN:  Acute inferior lateral myocardial infarction. The patient  was given four baby aspirin, IV heparin 5000 unit bolus and he was placed on  oxygen. EMS was called and the patient  was taken to the  cath lab urgently.  An ISTAT will be performed in the cath lab prior to the catheterization. The  patient was seen and examined by Dr. Juanito Doom.     Guy Franco, P.A. [facility]                      Thomas C. Wall, M.D. [facility]    LB/MEDQ  D:  06/05/2002  T:  06/07/2002  Job:  [id number]   cc:   Gregary Signs A. Everardo All, M.D. [facility]

## 2011-02-03 NOTE — H&P (Signed)
NAME:  [name] [name], [name] [name] NO.:  [id number]   MEDICAL RECORD NO.:  [id number]                   PATIENT TYPE:  INP   LOCATION:  4706                                 FACILITY:  [facility]   PHYSICIAN:  Sean A. Everardo All, M.D. [facility]           DATE OF BIRTH:  1951/06/27   DATE OF ADMISSION:  07/16/2002  DATE OF DISCHARGE:                                HISTORY & PHYSICAL   REASON FOR EVALUATION:  Chest pain.   HISTORY OF PRESENT ILLNESS:  The patient is a 60 year old man who had  stenting of a LAD lesion about a month ago. He now states about a week or so  of moderate to severe left anterior chest pain of a pressure type radiating  to the left arm into the jaw. No associated symptoms. The episodes are not  necessarily exertional, and they last about 45 minutes.   PAST MEDICAL HISTORY:  1. As above.  2. Cluster headache.  3. Depression/anxiety.  4. Cigarette smoker.  5. Osteoarthritis.   MEDICATIONS:  1. Toprol-XL 25 mg daily.  2. Lisinopril 10 mg a day.  3. Aspirin 325 mg daily.  4. Plavix 75 mg a day.  5. Zocor 40 mg daily.   SOCIAL/FAMILY HISTORY:  No change from admission one month ago.   REVIEW OF SYMPTOMS:  Denies the following:  Fever, syncope, cough, shortness  of breath, urinary incontinence, decreased urinary force, hematuria, and  depression.   PHYSICAL EXAMINATION:  VITAL SIGNS:  Blood pressure 120/70, heart rate 70,  temperature 98.2, weight 154.  GENERAL:  No distress.  SKIN:  Not diaphoretic.  HEENT:  Head is atraumatic. Sclerae are nonicteric. Pharynx clear.  NECK:  Supple.  CHEST:  Clear to auscultation. No respiratory distress. Chest wall is  normal.  CARDIOVASCULAR:  No JVD, no edema; regular, rate, and rhythm; no murmur.  ABDOMEN:  Soft, obese, nontender, no hepatosplenomegaly, no mass.  RECTAL:  Hemoccult negative, and the prostate is normal.  EXTREMITIES:  No ulcer is present at the foot. The feet are of normal color  and  temperature.  NEUROLOGICAL:  Alert, well oriented. He moves all fours. Gait is observed in  the office to be normal, and cranial nerves appear to be intact.   LABORATORY DATA:  Electrocardiogram is normal. LDL cholesterol is recently  46. CMET and CBC are normal.   IMPRESSION:  1. Recurrent anginal type chest pain after coronary stenting.  2. Ongoing cigarette smoking.  3. Well controlled dyslipidemia.  4. Other chronic medical problems as noted in the history and physical.   PLAN:  1. Admit to telemetry.  2. Consult cardiology.  3. Check CPKs.  4. Symptomatic therapy.  5. Increase Toprol.  6. Empiric PPI therapy.  Sean A. Everardo All, M.D. [facility]    SAE/MEDQ  D:  07/16/2002  T:  07/16/2002  Job:  [id number]

## 2011-02-03 NOTE — Discharge Summary (Signed)
   NAME:  [name] [name], CLEAVENGER NO.:  [id number]   MEDICAL RECORD NO.:  [id number]                   PATIENT TYPE:  INP   LOCATION:  3742                                 FACILITY:  [facility]   PHYSICIAN:  Luis Abed, M.D. 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 He has no known  coronary disease and was on no regular medications. He smoked cigarettes for  many years. There is no history of hypertension, diabetes, or known  hyperlipidemia. He presented with three week history of intermittent  discomfort radiating to the left arm with some diaphoresis and occasional  nausea. A stress test was to have been done but prior to commencement of the  test, he began having some chest pain with inferolateral ST segment  elevation. He was then taken urgently to the cath lab by Dr. Samule Ohm and  found to have an 80% segmental lesion of the mid LAD which was successfully  dilated and stented with a 3-0 x 28 mm Cypher stent without complication.  The remainder of his coronary anatomy looked essentially normal. A LV gram  was not done. He tolerated the procedure well but the following day felt a  little weak. We kept him an extra day. At the time of discharge, he was  ambulatory and doing well.   ACCESSORY CLINICAL FINDINGS:  Electrolytes and liver functions totally  normal. Hemoglobin 11.1, hematocrit of 40.0. His initial hemoglobin prior to  the cath was 15 with a hematocrit of 44. LFTs were normal. Coags were  normal. CKs were low, and troponins were low.   FINAL DIAGNOSES:  1. Coronary artery disease with acute coronary syndrome with urgent PCI of     LAD as above. The remainder of the coronaries were normal.  2. Long history of  cigarette use.   DISPOSITION:  The patient was discharged home on Prinivil 10 mg q.d., Toprol-  XL 25 mg q.d., Zocor 40 mg q.d., aspirin one daily, and Plavix 75 mg q.d. He  will see Dr. Samule Ohm back in a couple of weeks.     Dian Queen, P.A. [facility]                     Luis Abed, M.D. [facility]    BY/MEDQ  D:  06/07/2002  T:  06/10/2002  Job:  [id number]   cc:   Gregary Signs A. Everardo All, M.D. [facility]

## 2011-02-03 NOTE — H&P (Signed)
NAME:  [name] [name], [name] [name] NO.:  [id number]   MEDICAL RECORD NO.:  [id number]                   PATIENT TYPE:  INP   LOCATION:  4706                                 FACILITY:  [facility]   PHYSICIAN:  Olga Millers, MD [facility]              DATE OF BIRTH:  1951-02-27   DATE OF ADMISSION:  07/16/2002  DATE OF DISCHARGE:                                HISTORY & PHYSICAL   HISTORY OF PRESENT ILLNESS:  The patient is a 60 year old gentleman with a  recent history of PCI of the LAD who presents with chest pain.  The patient  presented with chest pain in September of this year.  During a stress test,  he was noted to have inferolateral ST segment elevation.  The patient  subsequently underwent cardiac catheterization and had PCI of his LAD at  that time.  Since that time, he has continued to have occasional chest pain.  He is somewhat vague in his description.  It is in the left upper chest and  left breast area.  It can radiate to the neck and also to the left upper  extremity.  It is not clearly exertional nor is pleuritic to position or  related to food.  There is no clear relieving factors.  He does state that  it is very similar to the symptoms he had prior to his PCI.   He had increasing symptoms today and was seen in the primary care office and  was transferred to [facility] for [facility] care.  He  continues to have pain at present.   PAST MEDICAL HISTORY:  Significant for coronary artery disease as outlined  above.  He does not have a history of diabetes mellitus but he does have a  history of hypertension and hyperlipidemia.  He is status post left knee  surgery.  He has had a hernia repair in 2000.  He has cluster headaches.   SOCIAL HISTORY:  He does have a history of tobacco use but there is no  alcohol use.   FAMILY HISTORY:  Positive for coronary artery disease.   MEDICATIONS:  1. Prinivil 10 mg p.o. q.d.  2. Toprol 25 mg 2 q.d.  3. Zocor 40 mg p.o. q.d.  4. Aspirin.  5. Plavix.   ALLERGIES:  He has no known drug allergies but he is allergic to [facility].  He  did not have an allergic reaction to his previous catheterization.   REVIEW OF SYMPTOMS:  He denies any recent fevers or chills but he does have  a headache after recent nitroglycerin.  There is no productive cough or  hemoptysis.  There is no dysphagia, odynophagia, melena, or hematochezia.  There is no dysuria or hematuria.  There is no rash or seizure activity.  There is no orthopnea, PND, pedal edema.  Remainder of review of systems are  negative.   PHYSICAL EXAMINATION:  VITAL SIGNS:  Blood pressure of 130/90, pulse 80.  GENERAL:  He is well-developed, well-nourished and in no acute distress.  SKIN:  Warm and dry.  HEENT:  Unremarkable.  NECK:  Supple with a normal upstroke bilaterally and there are no bruits  noted.  There is no jugular venous distention.  No thyromegaly.  CHEST:  Clear to auscultation and normal expansion.  CARDIOVASCULAR:  Regular rate and rhythm with normal S1 and S2.  There are  no murmurs, rubs, or gallops noted.  ABDOMEN:  Nontender.  Positive bowel sounds.  No hepatosplenomegaly.  No  masses appreciated.  There is no abdominal bruit.  PULSES:  He has 2+ femoral pulses bilaterally and no bruits.  EXTREMITIES:  No edema and I can palpate no cords.  He has 2+ dorsalis pedis  pulses bilaterally.  NEUROLOGICAL:  Grossly intact.   LABORATORY DATA:  His electrocardiogram shows normal sinus rhythm with  hyperacute T-waves.  It is unchanged from previous.   DIAGNOSES:  1. Recurrent chest pain.  2. Status post percutaneous intervention to the left anterior descending     artery.  3. History of cluster headaches.  4. Hypertension.  5. Hyperlipidemia.   PLAN:  The patient presents with recurrent chest pain.  His symptoms are  somewhat atypical but he states they are similar to what he experienced  prior to his previous PCI.  We  will therefore proceed with catheterization  as he is having ongoing pain.  The risks and benefits have been discussed  with the patient and he agrees to proceed.  We will continue with his  aspirin and Plavix as well as his Toprol.  We will also add heparin.  We  will make further recommendations once we have the above information.                                                 Olga Millers, MD [facility]    BC/MEDQ  D:  07/16/2002  T:  07/16/2002  Job:  [id number]

## 2011-02-03 NOTE — H&P (Signed)
NAME:  [name] [name], [name] [name] NO.:  [id number]   MEDICAL RECORD NO.:  [id number]                   PATIENT TYPE:  INP   LOCATION:  6524                                 FACILITY:  [facility]   PHYSICIAN:  Thomas C. Wall, M.D.                DATE OF BIRTH:  Apr 20, 1951   DATE OF ADMISSION:  09/03/2003  DATE OF DISCHARGE:                                HISTORY & PHYSICAL   HISTORY OF PRESENT ILLNESS:  We were asked by the emergency room to evaluate  [name] [name], a 60 year old African-American male with known coronary  artery disease with recurrent chest pain, left arm pain, nausea when he  awoke this morning.   He had a similar presentation in September 2003.  At that time, he had a  totally occluded LAD.  This was intervened upon with a Cypher stent and  balloon angioplasty.   He subsequently returned to the office with some atypical pain.  His stress  Cardiolite on November 12, 2002.  His ejection fraction was 33% with global  hypokinesia.  There was no ischemia, but inferior wall thinning.   He subsequently had a 2-D echocardiogram on December 03, 2002, which showed  normal left ventricular function, mild left ventricular hypertrophy.   He awoke Tuesday night and had severe pain and said he passed out.  He hit  his right eye which has a small laceration periorbitally.   He presents today with what he says worse pain than the first time when he  had his LAD stented.   EKG shows hyperacute T-waves anteriorly with some J-point elevation.  This  is similar to what he had in the past.   PAST MEDICAL HISTORY:  1. Hyperlipidemia.  2. He ran out of his medications recently and has a history of     noncompliance.   ALLERGIES:  No known drug allergies.   SOCIAL HISTORY:  He lives in [place] with his wife.  He works in Technical sales engineer.  He is married and has seven children.  He smokes a 1/2  pack of cigarettes still and has for 30 years.  He drinks on the  weekends.  He does not use any illicit drugs.   FAMILY HISTORY:  Significant for coronary disease.   REVIEW OF SYSTEMS:  Noncontributory.   PHYSICAL EXAMINATION:  VITAL SIGNS:  Blood pressure is 137/87, his pulse is  80 and regular, his respiratory rate is 20 and unlabored, temperature is  98.3, saturation is 96% on room air.  GENERAL:  He is in no obvious distress.  He is a very difficult historian.  HEENT:  Unremarkable except for the small laceration on the lateral aspect  of his eye.  NECK:  No JVD.  Carotid upstrokes are equal bilaterally without bruits.  There is no thyromegaly.  CARDIOVASCULAR:  An S4, no murmur.  LUNGS:  Clear.  ABDOMEN:  Soft, good  bowel sounds, no hepatomegaly noted.  There is no  tenderness.  Bowel sounds are present.  EXTREMITIES:  With no cyanosis, clubbing, or edema.  Pulses are brisk  bilaterally.  NEUROLOGIC:  Intact.   LABORATORY DATA:  EKG shows hyperacute T-waves which have been seen before,  diffusely, particularly inferior septal precordial area.   ASSESSMENT:  1. Chest discomfort similar to what he had previously with a left anterior     descending occlusion.  He is a difficult historian, and his EKG is really     similar to a previous one when he was not infarcting.  Unfortunately, he     is out of his medications, continues to smoke, and was occluded before.     I have talked to the patient about this and we think an urgent     catheterization with possible intervention is indicated.  2. Hyperlipidemia.  3. Noncompliance.  4. Syncope.  Apparently related to #1.  He does use alcohol and it could     have been related as well.                                                Thomas C. Wall, M.D.    TCW/MEDQ  D:  09/03/2003  T:  09/03/2003  Job:  [id number]   cc:   Salvadore Farber, M.D.  [address]  [address]  [place]  [place] [address]

## 2011-02-03 NOTE — Cardiovascular Report (Signed)
NAME:  [name] [name], [name] [name] NO.:  [id number]   MEDICAL RECORD NO.:  [id number]                   PATIENT TYPE:  INP   LOCATION:  6524                                 FACILITY:  [facility]   PHYSICIAN:  Charlies Constable, M.D.                  DATE OF BIRTH:  08/10/1951   DATE OF PROCEDURE:  09/03/2003  DATE OF DISCHARGE:                              CARDIAC CATHETERIZATION   CLINICAL HISTORY:  [name] is 60 years old and had a previous stent placed  to the LAD approximately in September of 2003.  He developed chest pain over  the last two days which became more severe today and was seen in the  emergency room by Dr. Daleen Squibb.  He had some anterior ST elevation but this had  not changed and was probably more consistent with early repolarization than  injury.  However, his symptoms were quite concerning for ischemia.  He was  brought to the laboratory emergently for angiography.  This was performed by  Dr. Diona Browner and he found a 90% lesion in the proximal LAD just at the  proximal edge of the previously placed stent.  There was no major  obstruction in the other vessels and the left ventriculogram was normal.   PROCEDURE:  The procedure was performed via the right femoral artery using  arterial sheath and a Q4 6-French guiding catheter with side holes.  We used  a PT2 wire with light support across the lesion in the LAD without  difficulty.  We pre dilated with a 3.25 x 15 mm Quantum Maverick performing  two inflations of 8 atmospheres for 30 seconds.  We then deployed a 3.5 x 23  mm Cypher stent overlapping the previous stent and proximal to the previous  stent and crossing a large septal perforator.  We deployed this with one  inflation of 15 atmospheres for 30 seconds.  Repeat diagnostic studies were  then performed through the guiding catheter.  The patient tolerated  procedure well and left the laboratory in satisfactory condition.   RESULTS:  Initially the stenosis  in the proximal LAD was estimated at 90%.  Following stenting this improved to 0%.  The TIMI flow improved from TIMI 2  to TIMI 3 flow.   CONCLUSION:  Successful stenting of the lesion in the proximal left anterior  descending with a drug-eluting stent with improvement in center of narrowing  from 90% to 0%, improvement in the flow from TIMI 2 to TIMI 3 flow.   DISPOSITION:  The patient returned to postanesthesia care unit for further  observation.                                               Charlies Constable, M.D.    BB/MEDQ  D:  09/03/2003  T:  09/04/2003  Job:  [id number]   cc:   Gregary Signs A. Everardo All, M.D. [facility]   Salvadore Farber, M.D.  [address]  [address]  [place]  [place] [address]   [facility]

## 2011-02-03 NOTE — Op Note (Signed)
NAME:  [name], LUNNEY NO.:  [id number]   MEDICAL RECORD NO.:  [id number]          PATIENT TYPE:  AMB   LOCATION:  SDS                          FACILITY:  [facility]   PHYSICIAN:  Burnard Bunting, M.D.    DATE OF BIRTH:  01-Jan-1951   DATE OF PROCEDURE:  01/29/2007  DATE OF DISCHARGE:                               OPERATIVE REPORT   PREOPERATIVE DIAGNOSIS:  Painful left foot incision with plantar callous  status post incision and drainage for infection several years ago.   POSTOPERATIVE DIAGNOSIS:  Painful left foot incision with plantar  callous status post incision and drainage for infection several years  ago.   PROCEDURE:  Left foot callous removal and scar revision.   SURGEON:  Burnard Bunting, M.D.   ASSISTANT:  None.   ANESTHESIA:  MAC plus local.   ESTIMATED BLOOD LOSS:  Minimal.   INDICATIONS:  [name] is a 60 year old patient who is a diabetic,  on Plavix and aspirin who has a painful callous on the plantar aspect of  his foot.  This is the area where the patient had an infection from  stepping on a nail.  He presents now for operative management and  possible scar revision.   PROCEDURE IN DETAIL:  The patient was brought to the operating room  where MAC plus local anesthetic was induced.  A solution of Marcaine  with epinephrine was injected into the plantar aspect of the foot near  the callous and hypertrophied scar on the plantar base of his foot.  The  hypertrophied scar was excised with the scalpel.  Callous removal was  performed.  The scar was taken down to the subdermal tissue, but not  through it.  Hypertrophic tissue was removed with the 15 blade.  Minimal  punctated bleeding was observed from the debrided region.  In general,  the region was significantly softer without the hard callous and hard  hypertrophied scar on the plantar aspect of the foot following removal.  Bactroban cream and soft dressing was applied.  The patient  tolerated  the procedure well without immediate complications.  Prior to the  procedure his foot was prepped with DuraPrep solution and draped in a  sterile manner.      Burnard Bunting, M.D.  Electronically Signed     GSD/MEDQ  D:  01/29/2007  T:  01/29/2007  Job:  [id number]

## 2011-02-03 NOTE — Op Note (Signed)
   NAME:  [name] [name], [name] [name] NO.:  [id number]   MEDICAL RECORD NO.:  [id number]                   PATIENT TYPE:  INP   LOCATION:  5027                                 FACILITY:  [facility]   PHYSICIAN:  Burnard Bunting, M.D.                 DATE OF BIRTH:  06/22/1951   DATE OF PROCEDURE:  04/03/2003  DATE OF DISCHARGE:                                 OPERATIVE REPORT   PREOPERATIVE DIAGNOSIS:  Left foot puncture wound.   POSTOPERATIVE DIAGNOSIS:  Left foot puncture wound.   OPERATION/PROCEDURE:  Left foot incision and drainage from dorsal and  plantar approach.   SURGEON:  Burnard Bunting, M.D.   ANESTHESIA:  General endotracheal anesthesia.   ESTIMATED BLOOD LOSS:  10 mL.   DRAINS:  None.   TOURNIQUET TIME:  1 hour and 20 minutes at 300 mmHg.   DESCRIPTION OF PROCEDURE:  The patient was brought to the operating room  where general endotracheal anesthesia was induced.  Left foot was prepped  with Hibiclens and saline injected into a sterile manner.  The patient had a  puncture wound between the first and second metatarsal at the approximate  level slightly distal to the metatarsal heads.  Once this puncture wound was  extended about 1.5 cm anteriorly and posteriorly.  Skin and subcutaneous  tissue were sharply divided using blunt dissection.  The puncture pass,  which was partially closed, was opened and explored.  Purulent discharge was  noted to come out from the immediate opening of the puncture wound using  blunt dissection to avoid neurovascular structures.  The path was opened  down to the level of the metatarsal head.  The MTP joint did not appear to  be involved.  The incision was made dorsally on the skin.  Blunt dissection  was then used to connect the corridor between the plantar and dorsal aspect  of the foot.  Using a fluoroscopic guide, an attempt was made to remove the  potential foreign body associated with this puncture wound.  Despite  using a  fluoroscopy in multiple planes, the potential foreign body could not be  localized and removed.  The incision was then irrigated with 2-3 L of  saline.  A small Penrose drain was placed from plantar to dorsal.  Tourniquet was released.  Foot was then wrapped in a bulky dressing.  The  patient tolerated the procedure well without immediate complications and he  was transported to the recovery room in stable condition.                                               Burnard Bunting, M.D.    GSD/MEDQ  D:  04/03/2003  T:  04/03/2003  Job:  [id number]

## 2011-02-03 NOTE — Discharge Summary (Signed)
NAME:  [name] [name], [name] [name] NO.:  [id number]   MEDICAL RECORD NO.:  [id number]                   PATIENT TYPE:  INP   LOCATION:  6524                                 FACILITY:  [facility]   PHYSICIAN:  Thomas C. Wall, M.D.                DATE OF BIRTH:  [birth year]   DATE OF ADMISSION:  09/03/2003  DATE OF DISCHARGE:  09/04/2003                                 DISCHARGE SUMMARY   PROCEDURE:  1. Cardiac catheterization.  2. Coronary arteriogram.  3. Left ventriculogram.  4. Percutaneous transluminal coronary angioplasty and stent to one vessel.   HOSPITAL COURSE:  Mr. Blankley is a 60 year old male with a history of coronary  artery disease who had an anterior MI in October 2003 with a stent to his  LAD. He had substernal chest pain and arm pain that was associated with  syncope on Tuesday night, then Wednesday he felt better, but on Thursday  woke up again with substernal chest pain and came to the ER. His symptoms  were decreased or relieved with nitroglycerin and he was taken to the  catheterization lab. The cardiac catheterization showed an 80% LAD proximal  to the prior stent placement and no other significant disease There was a  30% stenosis in an OM1. His EF was 50 to 55%. The plan was discussed with  Dr. Juanda Chance who felt percutaneous intervention to the LAD was indicated. This  was performed on September 03, 2003 with a Cypher stent reducing the stenosis  to 0 with TIMI-III flow. He tolerated the procedure well. The next day, his  post procedure CK-MB was negative. He had some chest pain after the  procedure, but this was relieved with IV nitroglycerin, and he is pain free  without nitrates on exam. Of note, his CBG was 224, and he stated that he  had no history of diabetes. He also stated he had no primary care physician  and did not wish to see anyone he had seen in the past. He was encouraged to  find a primary care physician and received case management  help for this. He  was not taking any medications prior to admission.   Mr. Lowden potassium was low at 3.3, and it was supplemented. He is to  follow up with cardiology on January 5, and he is to obtain a primary care  physician. Dr. Samule Ohm will also advise on adding an statin to his medication  regimen. He will be discharged on aspirin and Plavix with p.r.n. sublingual  nitroglycerin.   LABORATORY DATA:  Hemoglobin 12.6, hematocrit 37.9, WBCs 12.0, platelets  217. Sodium 139, potassium 3.3, chloride 95, CO2 31, BUN 18, creatinine 1.3,  glucose 224. CK-MB post procedure 67/2.7.   DISCHARGE CONDITION:  Improved.   DISCHARGE DIAGNOSES:  1. Unstable angina pain, status post percutaneous transluminal coronary     angioplasty and Cypher stent to the left anterior  descending artery this     admission.  2. 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  3. History of dyslipidemia.  4. History of noncompliance with medication.  5. History of tobacco use.  6. Family history of coronary artery disease.  7. Hyperglycemia on a nonfasting BMET.   DISCHARGE INSTRUCTIONS:  Activity level was to include no driving for two  days and no sexual or strenuous activity for a week. He is to call the  office with problems with the catheterization site. He is to stick to a diet  that is low in fat and sugar. He is to not use tobacco, and he is to obtain  a primary care physician. He is to followup with Dr. Samule Ohm on January 5 at  3:15.   DISCHARGE MEDICATIONS:  1. Aspirin 325 mg q.d.  2. Plavix 75 mg q.d.  3. Nitroglycerin p.r.n.      Theodore Demark, P.A. [facility]                  Thomas C. Wall, M.D.    RB/MEDQ  D:  09/04/2003  T:  09/05/2003  Job:  [id number]   cc:   Salvadore Farber, M.D.  [address]  [address]  [place] [employer]  [place] [address]

## 2011-02-03 NOTE — Consult Note (Signed)
NAME:  [name] [name], [name] [name] NO.:  [id number]   MEDICAL RECORD NO.:  [id number]          PATIENT TYPE:   LOCATION:                                 FACILITY:   PHYSICIAN:  Salvadore Farber, MD       DATE OF BIRTH:   DATE OF CONSULTATION:  10/18/2006  DATE OF DISCHARGE:                                 CONSULTATION   HISTORY OF PRESENT ILLNESS:  [name] [name] is a 60 year old gentleman who  suffered aborted anterior myocardial infarction in September2003 and  then recurrent aborted anterior myocardial infarction in  [place].  He has two drug-eluting stents in different parts of  his LAD.  He presented in late December2007 with some atypical chest  discomfort.  We checked a DDT Cardiolite.  He exercised for 9 minutes at  the standard Bruce protocol stopping due to fatigue and chest  discomfort.  He had no EKG abnormalities.  There was some inferobasal  thinning but no ischemia.  The EF was calculated to 43 but Dr. Jens Som  noted that he felt that, qualitatively, it looked better than this.   [name] [name] has had no recurrent chest discomfort and has had no  exertional dyspnea, PND, orthopnea, edema, palpitations, or  claudication.   CURRENT MEDICATIONS:  1. Enteric-coated aspirin 325 mg per day.  2. Zocor 40 mg per day.  3. Toprol XL 50 mg per day.   PHYSICAL EXAMINATION:  He is generally well appearing, in no distress  with heart rate 81, blood pressure 141/88, and weight of 167 pounds.  He  has no jugular venous distension, thyromegaly or lymphadenopathy.  LUNGS:  Are clear to auscultation.  Respiratory effort is normal.  He  has a nondisplaced point of maximal cardiac impulse.  There is a regular  rate and rhythm without murmur, rub or gallop. ABDOMEN:  Is soft,  nondistended, nontender.  There is hepatosplenomegaly.  Bowel sounds are  normal.  EXTREMITIES:  Are warm without clubbing, cyanosis, edema, or ulceration.  Carotid pulses 2+ bilateral without bruit.   IMPRESSION/RECOMMENDATIONS:  1. Coronary artery disease status post 2 aborted myocardial      infarctions.  Continue aspirin.  Given the two drug-eluting stents      in the left anterior descending and his apparent predisposition to      present with plaque rupture, acute coronary syndromes, I have      recommended lifelong Plavix as well.  Continue beta blockade.  2. Hypertension:  Blood pressure up a bit today.  Will increase Toprol      XL to 100 milligrams per day.  If still elevated at next visit,      could consider addition of angiotensin-converting enzyme inhibitor.      Salvadore Farber, MD  Electronically Signed     WED/MEDQ  D:  10/18/2006  T:  10/18/2006  Job:  [id number]

## 2011-02-03 NOTE — Assessment & Plan Note (Signed)
[facility]                            CARDIOLOGY OFFICE NOTE   NAME:[name] [name] COZZOLINO                      MRN:          [id number]  DATE:09/06/2006                            DOB:          1950/12/18    HISTORY OF PRESENT ILLNESS:  [name] [name] is a 60 year old gentleman who  suffered aborted anterior myocardial infarction in September 2003, and  then recurrent aborted anterior myocardial infarction in December of  2004.  He has 2 drug-eluting stents.  Both were due to separate plaque  ruptures in different parts of the LAD.  He missed multiple appointments  in 2005, but presents for followup today.   Since Thanksgiving he has been having substernal chest discomfort  accompanied by some left arm tingling, primarily at rest.  He is not  sure whether it is worse or more likely to occur with exertion.  There  is no associated dyspnea.  He had no PND, orthopnea, edema,  palpitations.  He has had no syncope.   CURRENT MEDICATIONS:  His only current medication is aspirin, which he  is taking most but not all days.   PHYSICAL EXAMINATION:  GENERAL:  He is generally well-appearing, in no  distress.  Heart rate is 89, blood pressure 118/78, weight 157 pounds.  He has no jugular venous distention.  LUNGS:  Clear to auscultation.  He has a nondisplaced point of maximal cardiac impulse.  There is a  regular rate and rhythm without murmur, rub or gallop.  ABDOMEN:  Soft, nondistended and nontender.  There is no  hepatosplenomegaly.  Bowel sounds are normal.  EXTREMITIES:  Warm without edema.  Carotid pulses are 2+ bilaterally  without bruit.  Femoral pulses are 2+ bilaterally without bruit.  DP  pulses 2+ bilaterally.   Electrocardiogram demonstrates normal sinus rhythm and is a normal  electrocardiogram.  It is unchanged compared with September 23, 2003.   IMPRESSION/RECOMMENDATIONS:  1. Chest pain with both typical and atypical features:  Given known  coronary disease we will proceed to exercise Cardiolite.  Hold beta      blocker on the day of the procedure.  2. Atherosclerotic coronary disease with 2 prior aborted myocardial      infarctions, one accompanied by syncope.  Continue aspirin and take      it on a daily basis.  Resume Zocor 40 milligrams per day and Toprol      XL 50 milligrams per day.  At next visit we will plan on resumption      of Plavix.  I was reluctant to pile too much on today.  3. Social situation:  Appears somewhat disheveled today.  Says he is      going through a divorce.  Is working.     [name] Farber, MD  Electronically Signed    WED/MedQ  DD: 09/06/2006  DT: 09/07/2006  Job #: [id number]

## 2011-02-03 NOTE — Op Note (Signed)
   NAME:  [name] [name], [name] [name] NO.:  [id number]   MEDICAL RECORD NO.:  [id number]                   PATIENT TYPE:  AMB   LOCATION:  DSC                                  FACILITY:  [facility]   PHYSICIAN:  Lowell Bouton, M.D.      DATE OF BIRTH:  Jul 05, 1951   DATE OF PROCEDURE:  10/24/2002  DATE OF DISCHARGE:                                 OPERATIVE REPORT   PREOPERATIVE DIAGNOSIS:  Foreign body, right palm.   POSTOPERATIVE DIAGNOSIS:  Foreign body, right palm.   PROCEDURE:  Removal of foreign body, right palm.   SURGEON:  Lowell Bouton, M.D.   ANESTHESIA:  0.5% Marcaine local.   OPERATIVE FINDINGS:  The patient had a 3 cm long wooden foreign body that  went from the proximal palmar crease back to the proximal palm  longitudinally.  It was deep down in the muscle between the third and fourth  metacarpal.   DESCRIPTION OF PROCEDURE:  Under 0.5% Marcaine anesthesia with a tourniquet  on the right forearm, the right hand was prepped and draped in the usual  fashion in the minor room.  The hand was elevated and the tourniquet was  inflated to 250 mmHg.  A longitudinal incision was made in the palm and  sharp dissection carried through the subcutaneous tissues.  Blunt dissection  was carried through the superficial palmar fascia and the foreign body was  located deep to that between the third and forth metacarpus.  The tendons  were not involved.  The foreign body was removed from the lumbrical muscles  and the wound was irrigated with saline, the skin was closed with 4-0 nylon  suture, sterile dressings were applied.  The tourniquet was released with  good circulation to the hand.  The patient was discharged in good condition.                                               Lowell Bouton, M.D.    EMM/MEDQ  D:  10/24/2002  T:  10/25/2002  Job:  [id number]

## 2011-02-03 NOTE — Discharge Summary (Signed)
NAME:  [name] [name], [name] [name]                         ACCOUNT NO.:  [id number]   MEDICAL RECORD NO.:  [id number]                   PATIENT TYPE:  INP   LOCATION:  4706                                 FACILITY:  [facility]   PHYSICIAN:  Bruce Rexene Edison. Swords, M.D. [facility]           DATE OF BIRTH:  [birth year]   DATE OF ADMISSION:  07/16/2002  DATE OF DISCHARGE:  07/17/2002                                 DISCHARGE SUMMARY   DISCHARGE DIAGNOSES:  1. Atypical chest pain.  2. Myocardial infarction ruled out.   HISTORY OF PRESENT ILLNESS:  The patient is a 60 year old African American  male who presented with chest pain.  The patient presented with chest pain  in September of this year.  He did have a stress test at that time that  revealed inferolateral ST segment elevation.  The patient underwent cardiac  catheterization and a PCI to his LAD.  Since that discharge, he has had  occasional chest pain, somewhat vague in description.  He describes it in  the left upper chest, sometimes with radiation to the left neck and left  upper extremity.  He denies any exertional chest pain.  He denies any  positional chest pain or pleuritic chest pain.  He does not describe any  relieving factors.   PAST MEDICAL HISTORY:  1. Coronary artery disease status post PCI to the LAD in September of 2003.  2. Hypertension.  3. Hyperlipidemia.  4. History of left knee surgery.  5. Status post hernia repair in 2000.  6. Cluster headaches.  7. History of tobacco use.   HOSPITAL COURSE:  ATYPICAL CHEST PAIN:  The patient was admitted to rule out  myocardial infarction.  Cardiac enzymes were negative.  Cardiology was asked  to see the patient.  They recommended the cardiac catheterization.  The  patient underwent cardiac catheterization on July 16, 2002.  This  revealed a widely patent LAD stent.  There was no evidence of other coronary  disease.  It was felt that the patient did not have any cardiac etiology for  his chest  pain.  They did check a D-dimer which was elevated at 2.  There  was no critical evidence of pulmonary embolus as he was not hypoxic and did  not complain of dyspnea or pleuritic pain; however, we will check a VQ scan  to rule out PE.  A CT of the chest was not done because he had had a cardiac  catheterization on July 16, 2002.  If the VQ scan is negative, the  patient will be discharged home.   Discharge potassium was 3.4, BUN 7, creatinine 0.7. Cardiac enzymes  negative.  CBC was normal.   DISCHARGE MEDICATIONS:  He will resume his home medications including:  1. Prinvil 10 mg q.d.  2. Toprol XL 25 mg q.d.  3. Zocor 40 mg q.d.  4. Aspirin 325 mg q.d.  5. Plavix 75 mg  q.d.    FOLLOW UP:  The patient should follow up with his primary care physician,  Stacie Glaze, M.D., in two to three weeks.     Cornell Barman, PA [facility]                    Bruce H. Swords, M.D. [facility]    LC/MEDQ  D:  07/17/2002  T:  07/17/2002  Job:  [id number]

## 2011-02-03 NOTE — Cardiovascular Report (Signed)
NAME:  [name] [name], [name] [name] NO.:  [id number]   MEDICAL RECORD NO.:  [id number]                   PATIENT TYPE:  OIB   LOCATION:  2861                                 FACILITY:  [facility]   PHYSICIAN:  Salvadore Farber, M.D.             DATE OF BIRTH:  30-Apr-1951   DATE OF PROCEDURE:  09/24/2003  DATE OF DISCHARGE:                              CARDIAC CATHETERIZATION   PROCEDURE:  Coronary angiography.   INDICATIONS:  [name] is a 60 year old gentleman status post two aborted  LAD infarcts, most recently September 03, 2003 with two stents placed in his  proximal and mid LAD.  I saw him in the office yesterday where he complained  of chest discomfort occurring primarily at rest over the past several weeks.  Pain was not as severe as, but reminiscent, of his prior anginal pain.  He  is therefore brought to cardiac catheterization to exclude threatened  subacute stent thrombosis.   PROCEDURAL TECHNIQUE:  Informed consent was obtained.  Under 1% lidocaine  local anesthesia a 5-French sheath was placed in the left femoral artery  using the modified Seldinger technique.  Diagnostic angiography and  ventriculography were performed using JL4, JL3.5, and JR4 catheters.  The  patient tolerated the procedure well and was transferred to the holding room  in stable condition.   COMPLICATIONS:  None.   FINDINGS:  1. Left main:  Angiographically normal.  2. LAD:  The LAD is a moderate sized vessel.  The stents are widely patent     with TIMI 3 flow distally.  3. Ramus intermedius:  Angiographically normal.  4. Circumflex:  Moderate sized vessel giving rise to two obtuse marginals.     It is angiographically normal.  5. RCA:  Large, dominant vessel which is angiographically normal.   IMPRESSION/RECOMMENDATION:  Widely patent left anterior descending stents.  There is no other significant coronary disease.  During the procedure the  patient had chest discomfort like he  has been having at home.  Normal flow  to all coronary territories was demonstrated at this time, convincingly  demonstrating that his pain is noncardiac.  Will continue with his current  preventative efforts.                                               Salvadore Farber, M.D.    WED/MEDQ  D:  09/24/2003  T:  09/24/2003  Job:  [id number]   cc:   Theodoro Grist, M.D.

## 2011-02-03 NOTE — Cardiovascular Report (Signed)
NAME:  TRE, SANKER NO.:  [id number]   MEDICAL RECORD NO.:  [id number]                   PATIENT TYPE:  INP   LOCATION:  2928                                 FACILITY:  [facility]   PHYSICIAN:  Salvadore Farber, M.D. [facility]         DATE OF BIRTH:  [birth year]   DATE OF PROCEDURE:  06/05/2002  DATE OF DISCHARGE:                              CARDIAC CATHETERIZATION   PROCEDURES:  Coronary angiography, stent to mid left anterior descending.   INDICATIONS:  The patient is a 60 year old man with several weeks of  progressive chest discomfort. He was scheduled for an exercise test this  morning.  After obtaining the rest images, he developed chest pain similar  to his prior.  This was accompanied by anterior and inferior ST elevations.  He was therefore brought by EMS from the office to the catheterization lab.  He arrived in the cardiac catheterization lab with persistent ST elevations  on the monitor and waxing and waning chest pain. He was hemodynamically  stable. Diagnostic angiography was begun urgently.   DIAGNOSTIC TECHNIQUE:  Under 2% lidocaine local anesthesia, a 6 French  sheath was placed in the right femoral artery using the modified Seldinger  technique.  Diagnostic angiography was performed using JL4 and JR4  catheters. Left ventriculogram was deferred in order to facilitate timely  reperfusion.   COMPLICATIONS:  None.   PERCUTANEOUS CORONARY INTERVENTION TECHNIQUE:  Anticoagulation was initiated  with heparin and Integrilin.  A 6 [place] CLS 4 guide was advanced over a  wire and engaged in the  ostium of the left coronary artery. A Hi-Torque Floppy wire was advanced  across the lesion and positioned in the distal LAD. The lesion was pre-  dilated with a 3.0 x 15 mm Maverick.  There was a relatively focal stenosis  at the distal end of a long segment of mild disease. In an effort to cover  much of his mild disease, a 3.0 x 28 mm Cypher was  deployed at 12  atmospheres.  The 3.0 mm Quantum balloon was used to post-dilate the  proximal portion of the stent. A  3.25 then 3.5 mm Quantum balloons were  used to post-dilate the distal end of the stent with a 3.5 mm balloon  inflated up to 18 atmospheres. Final angiograms demonstrated no residual  stenosis and TIMI-3 flow.   PLAN:  The patient will be admitted to the cardiac intensive care unit.  His  sheath will be removed when his ACT is less than 150 seconds. Integrilin  will be continued for 18 hours.  Aspirin will be continued indefinitely.  Plavix will be continued for a minimum of six months given his drug-eluting  stent and his acute presentation.  Aspirin and beta blockers will be  administered as his hemodynamic status allows.  Salvadore Farber, M.D. [facility]    WED/MEDQ  D:  06/05/2002  T:  06/06/2002  Job:  [id number]

## 2011-04-19 ENCOUNTER — Other Ambulatory Visit (HOSPITAL_COMMUNITY): Payer: Self-pay | Admitting: Cardiology

## 2011-05-08 ENCOUNTER — Other Ambulatory Visit (HOSPITAL_COMMUNITY): Payer: Self-pay

## 2011-05-08 ENCOUNTER — Inpatient Hospital Stay (HOSPITAL_COMMUNITY): Admission: RE | Admit: 2011-05-08 | Payer: Self-pay | Source: Ambulatory Visit

## 2011-05-09 ENCOUNTER — Other Ambulatory Visit (HOSPITAL_COMMUNITY): Payer: Self-pay | Admitting: Cardiology

## 2011-05-29 ENCOUNTER — Other Ambulatory Visit (HOSPITAL_COMMUNITY): Payer: Self-pay

## 2011-06-14 ENCOUNTER — Ambulatory Visit (HOSPITAL_COMMUNITY)
Admission: RE | Admit: 2011-06-14 | Discharge: 2011-06-14 | Disposition: A | Discharge status: Home / Self Care | Payer: Self-pay | Source: Ambulatory Visit | Attending: Cardiology | Admitting: Cardiology

## 2011-06-14 ENCOUNTER — Encounter (HOSPITAL_COMMUNITY)
Admission: RE | Admit: 2011-06-14 | Discharge: 2011-06-14 | Disposition: A | Discharge status: Home / Self Care | Payer: Self-pay | Source: Ambulatory Visit | Attending: Cardiology | Admitting: Cardiology

## 2011-06-14 DIAGNOSIS — R9439 Abnormal result of other cardiovascular function study: Secondary | ICD-10-CM | POA: Insufficient documentation

## 2011-06-14 DIAGNOSIS — R079 Chest pain, unspecified: Secondary | ICD-10-CM | POA: Insufficient documentation

## 2011-06-14 DIAGNOSIS — I2510 Atherosclerotic heart disease of native coronary artery without angina pectoris: Secondary | ICD-10-CM | POA: Insufficient documentation

## 2011-06-14 DIAGNOSIS — R0602 Shortness of breath: Secondary | ICD-10-CM | POA: Insufficient documentation

## 2011-06-14 MED ORDER — TECHNETIUM TC 99M TETROFOSMIN IV KIT
10.0000 | PACK | Freq: Once | INTRAVENOUS | Status: AC | PRN
  Administered 2011-06-14: 10 via INTRAVENOUS

## 2011-06-14 MED ORDER — TECHNETIUM TC 99M TETROFOSMIN IV KIT
30.0000 | PACK | Freq: Once | INTRAVENOUS | Status: AC | PRN
  Administered 2011-06-14: 30 via INTRAVENOUS

## 2011-06-19 LAB — CBC
HCT: 33.2 — ABNORMAL LOW
HCT: 38.4 — ABNORMAL LOW
Hemoglobin: 12.5 — ABNORMAL LOW
MCHC: 33.3
MCV: 88.3
Platelets: 168
Platelets: 185
Platelets: 203
Platelets: 203
RDW: 12.9
RDW: 13.1
RDW: 13.4
WBC: 8.7
WBC: 8.8

## 2011-06-19 LAB — TROPONIN I
Troponin I: 0.01
Troponin I: 0.01
Troponin I: 0.02

## 2011-06-19 LAB — DRUGS OF ABUSE SCREEN W/O ALC, ROUTINE URINE
Amphetamine Screen, Ur: NEGATIVE
Barbiturate Quant, Ur: NEGATIVE
Cocaine Metabolites: NEGATIVE
Creatinine,U: 32.3
Methadone: NEGATIVE
Phencyclidine (PCP): NEGATIVE

## 2011-06-19 LAB — COMPREHENSIVE METABOLIC PANEL
Albumin: 3.8
BUN: 3 — ABNORMAL LOW
Chloride: 100
Creatinine, Ser: 0.98
Total Bilirubin: 0.9
Total Protein: 6.5

## 2011-06-19 LAB — CK TOTAL AND CKMB (NOT AT ARMC)
CK, MB: 1.2
CK, MB: 1.3
CK, MB: 2.5
Relative Index: 1
Relative Index: 2.4
Total CK: 104

## 2011-06-19 LAB — GLUCOSE, CAPILLARY
Glucose-Capillary: 101 — ABNORMAL HIGH
Glucose-Capillary: 105 — ABNORMAL HIGH
Glucose-Capillary: 134 — ABNORMAL HIGH
Glucose-Capillary: 137 — ABNORMAL HIGH
Glucose-Capillary: 165 — ABNORMAL HIGH
Glucose-Capillary: 90
Glucose-Capillary: 90
Glucose-Capillary: 94
Glucose-Capillary: 94
Glucose-Capillary: 98

## 2011-06-19 LAB — BASIC METABOLIC PANEL
BUN: 4 — ABNORMAL LOW
BUN: 6
Calcium: 8.4
Calcium: 8.9
Chloride: 101
Creatinine, Ser: 0.76
GFR calc Af Amer: >60
GFR calc non Af Amer: >60
GFR calc non Af Amer: >60
Glucose, Bld: 90
Glucose, Bld: 96
Potassium: 3.8
Potassium: 4.2
Sodium: 139
Sodium: 139

## 2011-06-19 LAB — LIPID PANEL
HDL: 42
HDL: 43
LDL Cholesterol: 36
Total CHOL/HDL Ratio: 3
Triglycerides: 245 — ABNORMAL HIGH
Triglycerides: 253 — ABNORMAL HIGH
VLDL: 51 — ABNORMAL HIGH

## 2011-06-19 LAB — CARDIAC PANEL(CRET KIN+CKTOT+MB+TROPI)
CK, MB: 2.7
CK, MB: 6.1 — ABNORMAL HIGH
Relative Index: 2.2
Total CK: 122
Total CK: 137

## 2011-06-19 LAB — HEPARIN LEVEL (UNFRACTIONATED): Heparin Unfractionated: 0.56

## 2011-06-19 LAB — C-REACTIVE PROTEIN: CRP: 0.1 — ABNORMAL LOW (ref <0.6)

## 2011-11-27 ENCOUNTER — Other Ambulatory Visit (HOSPITAL_COMMUNITY): Payer: Self-pay | Admitting: Cardiology

## 2011-11-30 ENCOUNTER — Ambulatory Visit (HOSPITAL_COMMUNITY)
Admission: RE | Admit: 2011-11-30 | Discharge: 2011-11-30 | Disposition: A | Discharge status: Home / Self Care | Payer: 59 | Source: Ambulatory Visit | Attending: Cardiology | Admitting: Cardiology

## 2011-11-30 ENCOUNTER — Encounter (HOSPITAL_COMMUNITY)
Admission: RE | Admit: 2011-11-30 | Discharge: 2011-11-30 | Disposition: A | Discharge status: Home / Self Care | Payer: 59 | Source: Ambulatory Visit | Attending: Cardiology | Admitting: Cardiology

## 2011-11-30 DIAGNOSIS — R079 Chest pain, unspecified: Secondary | ICD-10-CM | POA: Insufficient documentation

## 2011-11-30 MED ORDER — REGADENOSON 0.4 MG/5ML IV SOLN
0.4000 mg | Freq: Once | INTRAVENOUS | Status: AC
  Administered 2011-11-30: 0.4 mg via INTRAVENOUS

## 2011-11-30 MED ORDER — REGADENOSON 0.4 MG/5ML IV SOLN
INTRAVENOUS | Status: AC
  Filled 2011-11-30: qty 5

## 2011-12-01 MED ORDER — TECHNETIUM TC 99M TETROFOSMIN IV KIT
10.0000 | PACK | Freq: Once | INTRAVENOUS | Status: AC | PRN
  Administered 2011-11-30: 10 via INTRAVENOUS

## 2011-12-01 MED ORDER — TECHNETIUM TC 99M TETROFOSMIN IV KIT
30.0000 | PACK | Freq: Once | INTRAVENOUS | Status: AC | PRN
  Administered 2011-11-30: 30 via INTRAVENOUS

## 2012-01-12 ENCOUNTER — Emergency Department (HOSPITAL_COMMUNITY)
Admission: EM | Admit: 2012-01-12 | Discharge: 2012-01-12 | Disposition: A | Discharge status: Home / Self Care | Payer: 59 | Attending: Emergency Medicine | Admitting: Emergency Medicine

## 2012-01-12 ENCOUNTER — Emergency Department (HOSPITAL_COMMUNITY): Payer: 59

## 2012-01-12 DIAGNOSIS — R079 Chest pain, unspecified: Secondary | ICD-10-CM | POA: Insufficient documentation

## 2012-01-12 DIAGNOSIS — R0602 Shortness of breath: Secondary | ICD-10-CM | POA: Insufficient documentation

## 2012-01-12 LAB — POCT I-STAT TROPONIN I: Troponin i, poc: 0 ng/mL (ref 0.00–0.08)

## 2012-01-12 LAB — BASIC METABOLIC PANEL
BUN: 11 mg/dL (ref 6–23)
Calcium: 9.4 mg/dL (ref 8.4–10.5)
Creatinine, Ser: 0.98 mg/dL (ref 0.50–1.35)
GFR calc non Af Amer: 89 mL/min — ABNORMAL LOW (ref >90)
Glucose, Bld: 92 mg/dL (ref 70–99)
Potassium: 3.9 mEq/L (ref 3.5–5.1)

## 2012-01-12 LAB — CBC
HCT: 41.4 % (ref 39.0–52.0)
Hemoglobin: 13.8 g/dL (ref 13.0–17.0)
MCH: 27.9 pg (ref 26.0–34.0)
MCHC: 33.3 g/dL (ref 30.0–36.0)
MCV: 83.6 fL (ref 78.0–100.0)

## 2012-01-12 LAB — POCT I-STAT, CHEM 8
Calcium, Ion: 1.23 mmol/L (ref 1.12–1.32)
Chloride: 105 mEq/L (ref 96–112)
Glucose, Bld: 91 mg/dL (ref 70–99)
HCT: 45 % (ref 39.0–52.0)
TCO2: 27 mmol/L (ref 0–100)

## 2012-01-12 MED ORDER — ASPIRIN 325 MG PO TABS: 325.0000 mg | ORAL_TABLET | ORAL | Status: DC

## 2012-01-12 MED ORDER — NITROGLYCERIN 0.4 MG SL SUBL: 0.4000 mg | SUBLINGUAL_TABLET | SUBLINGUAL | Status: DC | PRN

## 2012-01-12 NOTE — ED Provider Notes (Signed)
History     CSN: [id number]  Arrival date & time 01/12/12  1803   First MD Initiated Contact with Patient 01/12/12 1906      Chief Complaint  Patient presents with  . Chest Pain    (Consider location/radiation/quality/duration/timing/severity/associated sxs/prior treatment) Patient is a 61 y.o. male presenting with chest pain. The history is provided by the patient.  Chest Pain Primary symptoms include shortness of breath. Pertinent negatives for primary symptoms include no fever, no cough, no palpitations, no nausea and no vomiting.  Associated symptoms include diaphoresis.    the patient is a 62 year old male, with known coronary artery disease, who presents to emergency department complaining of chest tightness, shortness of breath, diaphoresis, and pain, radiating down his left arm, while he was pushing a car passing out.  Medications.  He was given aspirin and nitroglycerin and his symptoms are resolved.  Now.  He denies nausea, vomiting, fevers, chills, cough.  He has not been sick over the past few days.  He has nitroglycerin spray at home, but he did not have at work.  Today.  He states that he had similar symptoms in March.  A treadmill was performed and it was negative.  No past medical history on file.  No past surgical history on file.  No family history on file.  History  Substance Use Topics  . Smoking status: Not on file  . Smokeless tobacco: Not on file  . Alcohol Use: Not on file      Review of Systems  Constitutional: Positive for diaphoresis. Negative for fever and chills.  Respiratory: Positive for chest tightness and shortness of breath. Negative for cough.   Cardiovascular: Positive for chest pain. Negative for palpitations and leg swelling.  Gastrointestinal: Negative for nausea and vomiting.  Neurological: Negative for headaches.  All other systems reviewed and are negative.    Allergies  Review of patient's allergies indicates no known  allergies.  Home Medications   Current Outpatient Rx  Name Route Sig Dispense Refill  . NITROGLYCERIN 0.4 MG/SPRAY TL SOLN Sublingual Place 1 spray under the tongue every 5 (five) minutes as needed. For chest pain      BP 144/94  Pulse 66  Temp(Src) 97.4 F (36.3 C) (Axillary)  Resp 15  SpO2 100%  Physical Exam  Vitals reviewed. Constitutional: He is oriented to person, place, and time. He appears well-developed and well-nourished. No distress.  HENT:  Head: Normocephalic and atraumatic.  Eyes: Conjunctivae are normal.  Neck: Normal range of motion. Neck supple.  Cardiovascular: Normal rate and regular rhythm.   No murmur heard. Pulmonary/Chest: Effort normal and breath sounds normal.  Abdominal: Soft. Bowel sounds are normal. He exhibits no distension.  Musculoskeletal: Normal range of motion. He exhibits no edema.  Neurological: He is alert and oriented to person, place, and time.  Skin: Skin is warm and dry.  Psychiatric: He has a normal mood and affect. Thought content normal.    ED Course  Procedures (including critical care time) 61 year old male, with known coronary artery disease, presents to the emergency department with chest tightness, shortness of breath, diaphoresis, and radiation into his left arm is resolved now.  His EKG does not show signs of a STEMI.  We will perform laboratory testing, and consult his cardiologist following the results of his tests.  Labs Reviewed  BASIC METABOLIC PANEL - Abnormal; Notable for the following:    GFR calc non Af Amer 89 (*)    All other components within  normal limits  CBC  POCT I-STAT, CHEM 8   Dg Chest 2 View  01/12/2012  *RADIOLOGY REPORT*  Clinical Data: Chest pain and shortness of breath.  CHEST - 2 VIEW  Comparison: 03/31/2010.  Findings: Normal sized heart.  Clear lungs.  Stable mild central peribronchial thickening.  Stable left coronary artery stent. Minimal thoracic spine degenerative changes.  IMPRESSION:  Stable mild bronchitic changes.  No acute abnormality.  Original Report Authenticated By: Darrol Angel, M.D.     No diagnosis found.  ED ECG REPORT    ECG ECG time 1825 normal sinus rhythm with heart rate 74 Normal axis Normal.  Interval Normal sinus rhythm, with no significant changes compared to 06/07/2009   pt remains asx  I spoke with Dr. Sharyn Lull.  I explained h&P and results of ecg and labs. I suggested outpt f/u.  Dr. Sharyn Lull agreed.  He said pt can f/u on Monday.   I explained this to pt.  He understands and agrees.       MDM  Chest pain No signs acs       Cheri Guppy, MD 01/12/12 2203

## 2012-01-12 NOTE — ED Notes (Signed)
PT ambulated with a steady gait; VSS; A&Ox3; no signs of distress; respirations even and unlabored; skin warm and dry; no questions; reports feeling better.

## 2012-01-12 NOTE — ED Notes (Signed)
Patient transported to X-ray 

## 2012-01-12 NOTE — ED Notes (Signed)
Patient was at work and pushing a cart. Sudden onset of midchest pain radiating to left jaw, left neck and left arm with diaphoresis. Described as tightness, rated 10/10. No sob or n/v. Pt has extensive cardiac hx including multiple stent placements. Patient also had stress chest about 1 month ago that was unremarkable. Received ASA 324 at his job. Received Nitro 0.4 mg x 1 with total relief. 12 lead EKG unremarkable. Currently rates pain 0/10. AAOx4. NAD.

## 2012-01-12 NOTE — Discharge Instructions (Signed)
Your EKG, and blood tests do not show signs of a heart attack or damage to your heart.  Continue your daily, aspirin, and use nitroglycerin as needed.  Followup with Dr. Sharyn Lull only this Monday.  Return for worse or uncontrolled symptoms

## 2014-03-05 ENCOUNTER — Ambulatory Visit (INDEPENDENT_AMBULATORY_CARE_PROVIDER_SITE_OTHER): Payer: 59 | Admitting: General Surgery

## 2015-02-19 ENCOUNTER — Ambulatory Visit: Payer: Self-pay | Admitting: Internal Medicine

## 2016-06-21 ENCOUNTER — Ambulatory Visit: Payer: Self-pay | Admitting: Internal Medicine

## 2016-07-18 ENCOUNTER — Encounter: Payer: Self-pay | Admitting: Internal Medicine

## 2016-08-09 ENCOUNTER — Ambulatory Visit: Payer: Self-pay | Admitting: Internal Medicine

## 2016-08-23 ENCOUNTER — Ambulatory Visit: Payer: Self-pay | Admitting: Internal Medicine

## 2016-08-25 ENCOUNTER — Ambulatory Visit: Payer: Self-pay | Admitting: Internal Medicine

## 2016-08-30 ENCOUNTER — Ambulatory Visit: Payer: Self-pay | Admitting: Internal Medicine

## 2017-02-14 ENCOUNTER — Emergency Department (HOSPITAL_COMMUNITY)
Admission: EM | Admit: 2017-02-14 | Discharge: 2017-02-15 | Disposition: A | Discharge status: Home / Self Care | Payer: BLUE CROSS/BLUE SHIELD | Attending: Emergency Medicine | Admitting: Emergency Medicine

## 2017-02-14 ENCOUNTER — Encounter (HOSPITAL_COMMUNITY): Payer: Self-pay

## 2017-02-14 ENCOUNTER — Emergency Department (HOSPITAL_COMMUNITY): Payer: BLUE CROSS/BLUE SHIELD

## 2017-02-14 DIAGNOSIS — W25XXXA Contact with sharp glass, initial encounter: Secondary | ICD-10-CM | POA: Diagnosis not present

## 2017-02-14 DIAGNOSIS — S61223A Laceration with foreign body of left middle finger without damage to nail, initial encounter: Secondary | ICD-10-CM | POA: Insufficient documentation

## 2017-02-14 DIAGNOSIS — Y929 Unspecified place or not applicable: Secondary | ICD-10-CM | POA: Diagnosis not present

## 2017-02-14 DIAGNOSIS — Y9389 Activity, other specified: Secondary | ICD-10-CM | POA: Diagnosis not present

## 2017-02-14 DIAGNOSIS — Z23 Encounter for immunization: Secondary | ICD-10-CM | POA: Diagnosis not present

## 2017-02-14 DIAGNOSIS — S61213A Laceration without foreign body of left middle finger without damage to nail, initial encounter: Secondary | ICD-10-CM

## 2017-02-14 DIAGNOSIS — S61211A Laceration without foreign body of left index finger without damage to nail, initial encounter: Secondary | ICD-10-CM

## 2017-02-14 DIAGNOSIS — Y999 Unspecified external cause status: Secondary | ICD-10-CM | POA: Diagnosis not present

## 2017-02-14 MED ORDER — TETANUS-DIPHTH-ACELL PERTUSSIS 5-2.5-18.5 LF-MCG/0.5 IM SUSP
0.5000 mL | Freq: Once | INTRAMUSCULAR | Status: AC
  Administered 2017-02-14: 0.5 mL via INTRAMUSCULAR
  Filled 2017-02-14: qty 0.5

## 2017-02-14 NOTE — Discharge Instructions (Addendum)
As discussed, monitor for any signs of infection. Ibuprofen for pain. Keep the area clean and dry for at least 24 hours. Have the wound rechecked in 48 hours. Follow-up with primary care provider for suture removal in 10 days.  Return if you experience increased swelling, increased pain, redness, warmth, fever, chills purulent discharge or any other new concerning symptoms in the meantime.

## 2017-02-14 NOTE — ED Triage Notes (Signed)
States cut left index now its still bleeding and won't stop with dressing in place.

## 2017-02-14 NOTE — ED Provider Notes (Signed)
[facility]-EMERGENCY DEPT Provider Note   CSN: [id number] Arrival date & time: 02/14/17  2035     History   Chief Complaint Chief Complaint  Patient presents with  . Extremity Laceration    HPI [name] [name] is a 66 y.o. male presenting with laceration of the left index and middle finger after attempting to close a window and the window shattering. He denies difficulty moving his fingers but the bleeding has been difficult to control. He states that his tetanus is not up-to-date. He denies numbness no other injuries.  HPI  History reviewed. No pertinent past medical history.  There are no active problems to display for this patient.   Past Surgical History:  Procedure Laterality Date  . STENT PLACEMENT VASCULAR ([facility] HX)         Home Medications    Prior to Admission medications   Medication Sig Start Date End Date Taking? Authorizing Provider  ibuprofen (ADVIL,MOTRIN) 600 MG tablet Take 1 tablet (600 mg total) by mouth every 6 (six) hours as needed. 02/15/17   Mathews Robinsons B, PA-C  nitroGLYCERIN (NITROLINGUAL) 0.4 MG/SPRAY spray Place 1 spray under the tongue every 5 (five) minutes as needed. For chest pain    [provider]    Family History History reviewed. No pertinent family history.  Social History Social History  Substance Use Topics  . Smoking status: Never Smoker  . Smokeless tobacco: Never Used  . Alcohol use No     Allergies   Patient has no known allergies.   Review of Systems Review of Systems  Respiratory: Negative for shortness of breath and wheezing.   Cardiovascular: Negative for chest pain and palpitations.  Skin: Positive for wound.  Neurological: Negative for weakness and numbness.     Physical Exam Updated Vital Signs BP (!) 141/92 (BP Location: Left Arm)   Pulse 94   Temp 98.6 F (37 C) (Oral)   Resp 18   Ht 5\' 11"  (1.803 m)   Wt 74.8 kg (165 lb)   SpO2 99%   BMI 23.01 kg/m   Physical Exam    Constitutional: He appears well-developed and well-nourished. No distress.  Afebrile, nontoxic-appearing, sitting comfortably in chair in no acute distress.  HENT:  Head: Normocephalic and atraumatic.  Eyes: Conjunctivae and EOM are normal.  Neck: Neck supple.  Cardiovascular: Normal rate, regular rhythm, normal heart sounds and intact distal pulses.   No murmur heard. Pulmonary/Chest: Effort normal and breath sounds normal. No respiratory distress. He has no wheezes. He has no rales.  Abdominal: Soft. There is no tenderness.  Musculoskeletal: Normal range of motion. He exhibits tenderness. He exhibits no edema or deformity.  Full rom at all MCP PIP and DIP. Brisk cap refills distally and good sensation. neurovascularly intact. Visualized the extensor tendon of his middle left finger without damage to the tendon sheath. 5 out of 5 strength to resisted extension and flexion at all joints.   Neurological: He is alert. No sensory deficit.  Skin: Skin is warm. Capillary refill takes less than 2 seconds. He is not diaphoretic. No pallor.  Left lateral index finger ~1cm laceration. Left middle finger with 0.5cm stellate laceration over the PIP joint  Psychiatric: He has a normal mood and affect.  Nursing note and vitals reviewed.    ED Treatments / Results  Labs (all labs ordered are listed, but only abnormal results are displayed) Labs Reviewed - No data to display  EKG  EKG Interpretation None  Radiology Dg Hand Complete Left  Result Date: 02/14/2017 CLINICAL DATA:  Left hand pain, laceration to the third finger at the middle phalanx. EXAM: LEFT HAND - COMPLETE 3+ VIEW COMPARISON:  None. FINDINGS: A 3 mm rounded cutaneous nodular density is seen along the volar aspect of the left middle finger at the level of proximal phalanx. No soft tissue foreign body, fracture nor dislocation is seen. 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  Subcortical degenerative cystic change is noted of the distal pole of the scaphoid and lunate. IMPRESSION: No radiopaque foreign body, subcutaneous emphysema nor acute osseous involvement status post laceration to the left middle finger. A rounding 3 mm cutaneous nodular density is seen along the volar aspect of the left middle finger for which clinical correlation is recommended. This may represent a cutaneous skin lesion such as a wart or posttraumatic blister. Electronically Signed   By: Tollie Eth M.D.   On: 02/14/2017 23:07    Procedures Procedures (including critical care time)  NERVE BLOCK Performed by: Georgiana Shore Consent: Verbal consent obtained. Required items: required blood products, implants, devices, and special equipment available Time out: Immediately prior to procedure a "time out" was called to verify the correct patient, procedure, equipment, support staff and site/side marked as required.  Indication: laceration to finger Nerve block body site: middle left finger  Preparation: Patient was prepped and draped in the usual sterile fashion. Needle gauge: 25 G Location technique: anatomical landmarks  Local anesthetic: lidocaine 1% w/out epi  Anesthetic total: 3 ml  Outcome: pain improved Patient tolerance: Patient tolerated the procedure well with no immediate complications.  NERVE BLOCK Performed by: Georgiana Shore Consent: Verbal consent obtained. Required items: required blood products, implants, devices, and special equipment available Time out: Immediately prior to procedure a "time out" was called to verify the correct patient, procedure, equipment, support staff and site/side marked as required.  Indication: laceration to index left finger Nerve block body site: left index  Preparation: Patient was prepped and draped in the usual sterile fashion. Needle gauge: 25 G Location technique: anatomical landmarks  Local anesthetic: lidocaine 1% w/out  epi  Anesthetic total: 3 ml  Outcome: pain improved Patient tolerance: Patient tolerated the procedure well with no immediate complications. LACERATION REPAIR Performed by: Georgiana Shore Authorized by: Georgiana Shore Consent: Verbal consent obtained. Risks and benefits: risks, benefits and alternatives were discussed Consent given by: patient Patient identity confirmed: provided demographic data Prepped and Draped in normal sterile fashion Wound explored  Laceration Location: middle left finger over PIP  Laceration Length: 0.5 cm stellate  No Foreign Bodies seen or palpated  Anesthesia:digital block  Irrigation method: syringe pressurized Amount of cleaning: standard  Skin closure: 5.0 prolene  Number of sutures: 3  Technique: 1 horizontal mattress and 2 simple interrupted  Patient tolerance: Patient tolerated the procedure well with no immediate complications.  LACERATION REPAIR Performed by: Georgiana Shore Authorized by: Georgiana Shore Consent: Verbal consent obtained. Risks and benefits: risks, benefits and alternatives were discussed Consent given by: patient Patient identity confirmed: provided demographic data Prepped and Draped in normal sterile fashion Wound explored  Laceration Location: left index finger  Laceration Length: 1cm  No Foreign Bodies seen or palpated  Anesthesia: Digital block   Irrigation method: syringe pressurized Amount of cleaning: standard  Skin closure: 5.0 prolene  Number of sutures: 5  Technique: simple interrupted  Patient tolerance: Patient tolerated the procedure well with  no immediate complications. Medications Ordered in ED Medications  ibuprofen (ADVIL,MOTRIN) tablet 800 mg (not administered)  Tdap (BOOSTRIX) injection 0.5 mL (0.5 mLs Intramuscular Given 02/14/17 2317)  lidocaine (PF) (XYLOCAINE) 1 % injection 5 mL (5 mLs Infiltration Given by Other 02/15/17 0055)     Initial Impression /  Assessment and Plan / ED Course  I have reviewed the triage vital signs and the nursing notes.  Pertinent labs & imaging results that were available during my care of the patient were reviewed by me and considered in my medical decision making (see chart for details).    Patient presents with laceration to the left hand (LT index and middle finger) from a window shattering. Imaging without evidence of fracture, dislocation or foreign body. Tetanus was updated while in the emergency department.  Pressure irrigation performed. Wound explored and base of wound visualized in a bloodless field without evidence of foreign body.  Laceration occurred < 8 hours prior to repair which was well tolerated.  Tdap updated.  Pt has no comorbidities to effect normal wound healing. Pt discharged without antibiotics.    Discussed suture home care with patient and answered questions. Pt to follow-up for wound check and suture removal in 7 days; they are to return to the ED sooner for signs of infection. Pt is hemodynamically stable with no complaints prior to dc.   Discussed strict return precautions and advised to return to the emergency department if experiencing any new or worsening symptoms. Instructions were understood and patient agreed with discharge plan. Final Clinical Impressions(s) / ED Diagnoses   Final diagnoses:  Laceration of left index finger without foreign body without damage to nail, initial encounter  Laceration of left middle finger without foreign body without damage to nail, initial encounter    New Prescriptions New Prescriptions   IBUPROFEN (ADVIL,MOTRIN) 600 MG TABLET    Take 1 tablet (600 mg total) by mouth every 6 (six) hours as needed.     Georgiana ShoreMitchell, Jessica B, PA-C 02/15/17 0140    Raeford RazorKohut, Stephen, MD 02/18/17 2047

## 2017-02-15 MED ORDER — LIDOCAINE HCL (PF) 1 % IJ SOLN
5.0000 mL | Freq: Once | INTRAMUSCULAR | Status: AC
  Administered 2017-02-15: 5 mL
  Filled 2017-02-15: qty 30

## 2017-02-15 MED ORDER — BACITRACIN ZINC 500 UNIT/GM EX OINT
TOPICAL_OINTMENT | CUTANEOUS | Status: AC
  Administered 2017-02-15: 02:00:00
  Filled 2017-02-15: qty 0.9

## 2017-02-15 MED ORDER — IBUPROFEN 600 MG PO TABS: 600.0000 mg | ORAL_TABLET | Freq: Four times a day (QID) | ORAL | 0 refills | Status: DC | PRN

## 2017-02-15 MED ORDER — IBUPROFEN 800 MG PO TABS
800.0000 mg | ORAL_TABLET | Freq: Once | ORAL | Status: AC
  Administered 2017-02-15: 800 mg via ORAL
  Filled 2017-02-15: qty 1

## 2017-03-02 ENCOUNTER — Encounter: Payer: Self-pay | Admitting: Internal Medicine

## 2017-03-02 ENCOUNTER — Ambulatory Visit (INDEPENDENT_AMBULATORY_CARE_PROVIDER_SITE_OTHER): Payer: BLUE CROSS/BLUE SHIELD | Admitting: Internal Medicine

## 2017-03-02 VITALS — BP 164/94 | HR 78 | Temp 98.5°F | Ht 71.0 in | Wt 175.0 lb

## 2017-03-02 DIAGNOSIS — M150 Primary generalized (osteo)arthritis: Secondary | ICD-10-CM

## 2017-03-02 DIAGNOSIS — Z4802 Encounter for removal of sutures: Secondary | ICD-10-CM

## 2017-03-02 DIAGNOSIS — Z1159 Encounter for screening for other viral diseases: Secondary | ICD-10-CM

## 2017-03-02 DIAGNOSIS — Z9189 Other specified personal risk factors, not elsewhere classified: Secondary | ICD-10-CM | POA: Diagnosis not present

## 2017-03-02 DIAGNOSIS — I25118 Atherosclerotic heart disease of native coronary artery with other forms of angina pectoris: Secondary | ICD-10-CM

## 2017-03-02 DIAGNOSIS — Z125 Encounter for screening for malignant neoplasm of prostate: Secondary | ICD-10-CM | POA: Diagnosis not present

## 2017-03-02 DIAGNOSIS — I10 Essential (primary) hypertension: Secondary | ICD-10-CM

## 2017-03-02 DIAGNOSIS — H04129 Dry eye syndrome of unspecified lacrimal gland: Secondary | ICD-10-CM | POA: Diagnosis not present

## 2017-03-02 DIAGNOSIS — E782 Mixed hyperlipidemia: Secondary | ICD-10-CM | POA: Diagnosis not present

## 2017-03-02 DIAGNOSIS — R3915 Urgency of urination: Secondary | ICD-10-CM | POA: Diagnosis not present

## 2017-03-02 DIAGNOSIS — Z87891 Personal history of nicotine dependence: Secondary | ICD-10-CM

## 2017-03-02 DIAGNOSIS — M159 Polyosteoarthritis, unspecified: Secondary | ICD-10-CM

## 2017-03-02 DIAGNOSIS — Z1211 Encounter for screening for malignant neoplasm of colon: Secondary | ICD-10-CM

## 2017-03-02 NOTE — Patient Instructions (Addendum)
Resume metoprolol ER for blood pressure control and heart disease  Follow up with specialists as scheduled  6 Sutures removed from fingers. Local care with washing in antibacterial soap daily.  Follow up in 1 month for CPE/EKG

## 2017-03-02 NOTE — Progress Notes (Signed)
Patient ID: [name] [name], male   DOB: [birth year], 66 y.o.   MRN: [id number]    Location:  [facility] Place of Service: OFFICE    Advanced Directive information Does Patient Have a Medical Advance Directive?: Yes, Type of Advance Directive: Healthcare Power of [place];Living will  Chief Complaint  Patient presents with  . Medical Management of Chronic Issues    New patient, needs PCP  . Suture / Staple Removal    need to remove from left hand index finger and knuckle middle finger   . Colonoscopy    never had one.     HPI:  66 yo male seen today as a new pt. He was seen in the ED 5/30th for finger laceration. He rec'd several sutures and was supposed to have sutures removed in 7 days but he states he forgot. He has not noticed a d/c or redness. No f/c. Some of sutures fell out on their own.  Dry eye - stable on xiidra eye gtts; followed by eye specialist  CP/angina/HTN/CAD s/p stent - takes SLNTG prn and metoprolol ER 25 mg daily. Followed by cardio Dr Anne Ng.   Hyperlipidemia - diet controlled.   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 Hyperlipidemia   . Hypertension     Past Surgical History:  Procedure Laterality Date  . STENT PLACEMENT VASCULAR ([place] HX)      Patient Care Team: Gildardo Cranker, DO as PCP - General (Internal Medicine) Charolette Forward, MD as Consulting Physician (Cardiology)  Social History   Social History  . Marital status: Legally Separated    Spouse name: N/A  . Number of children: N/A  . Years of education: N/A   Occupational History  . RN at [employer]  . Smoking status: Former Research scientist (life sciences)  . Smokeless tobacco: Never Used  . Alcohol use No  . Drug use: No  . Sexual activity: No   Other Topics Concern  . Not on file   Social History  Narrative   Social History      Diet? regular      Do you drink/eat things with caffeine?      Marital status?               separated                     What year were you married? 1982      Do you live in a house, apartment, assisted living, condo, trailer, etc.?  (house circled on new patient packet)      Is it one or more stories? yes      How many persons live in your home? 1      Do you have any pets in your home? (please list) no      Highest level of education completed?      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         no                             Type &  how often?      Do you have a living will? Yes      Do you have a DNR form?      Yes                            If not, do you want to discuss one?      Do you have signed POA/HPOA for forms? yes      Functional Status      Do you have difficulty bathing or dressing yourself? no      Do you have difficulty preparing food or eating?  no      Do you have difficulty managing your medications? no      Do you have difficulty managing your finances? no      Do you have difficulty affording your medications? no        reports that he has quit smoking. He has never used smokeless tobacco. He reports that he does not drink alcohol or use drugs.  History reviewed. No pertinent family history. Family Status  Relation Status  . Mother Deceased  . Father Deceased  . Daughter Alive  . Son Alive  . Sister Alive  . Sister Alive  . Daughter Alive  . Daughter Alive  . Daughter Alive  . Son Alive  . Son Alive    Immunization History  Administered Date(s) Administered  . Tdap 02/14/2017    No Known Allergies  Medications: Patient's Medications  New Prescriptions   No medications on file  Previous Medications   CIPROFLOXACIN (CILOXAN) 0.3 % OPHTHALMIC SOLUTION    One drop both eyes 4 times daily   IBUPROFEN (ADVIL,MOTRIN) 600 MG TABLET     Take 1 tablet (600 mg total) by mouth every 6 (six) hours as needed.   NITROGLYCERIN (NITROLINGUAL) 0.4 MG/SPRAY SPRAY    Place 1 spray under the tongue every 5 (five) minutes as needed. For chest pain   XIIDRA 5 % SOLN    One drop both eyes twice a day  Modified Medications   No medications on file  Discontinued Medications   No medications on file    Review of Systems  HENT: Positive for dental problem (no teeth; bleeding gums).   Eyes: Positive for visual disturbance (wears corrective lenses).  Genitourinary: Positive for urgency.  All other systems reviewed and are negative.   Vitals:   03/02/17 1057  BP: (!) 164/94  Pulse: 78  Temp: 98.5 F (36.9 C)  TempSrc: Oral  SpO2: 98%  Weight: 175 lb (79.4 kg)  Height: 5' 11" (1.803 m)   Body mass index is 24.41 kg/m.  Physical Exam  Constitutional: He is oriented to person, place, and time. He appears well-developed and well-nourished.  HENT:  Mouth/Throat: Oropharynx is clear and moist.  MMM; no oral thrush. Poor dentition  Eyes: Pupils are equal, round, and reactive to light. No scleral icterus.  Neck: Neck supple. Carotid bruit is not present. No thyromegaly present.  Cardiovascular: Normal rate, regular rhythm and intact distal pulses.  Exam reveals no gallop and no friction rub.   Murmur (1/6 SEM) heard. No distal LE edema. No calf TTP  Pulmonary/Chest: Effort normal and breath sounds normal. He has no wheezes. He has no rales. He exhibits no tenderness.  Abdominal: Soft. Normal appearance and bowel sounds are normal. He exhibits no distension, no abdominal bruit, no pulsatile midline mass and no mass. There is no  hepatomegaly. There is no tenderness. There is no rigidity, no rebound and no guarding. No hernia.  Musculoskeletal: He exhibits edema (left tip of 2nd finger abd 3rd PIP joint;).  Lymphadenopathy:    He has no cervical adenopathy.  Neurological: He is alert and oriented to person, place, and time.  Skin: Skin  is warm and dry. No rash noted.  5 sutures on left 2nd finger; 1 suture on left 3rd finger PIP joint with no secondary signs of infection  Psychiatric: He has a normal mood and affect. His behavior is normal. Judgment and thought content normal.     Labs reviewed: No visits with results within 3 Month(s) from this visit.  Latest known visit with results is:  Admission on 01/12/2012, Discharged on 01/12/2012  Component Date Value Ref Range Status  . WBC 01/12/2012 7.7  4.0 - 10.5 K/uL Final  . RBC 01/12/2012 4.95  4.22 - 5.81 MIL/uL Final  . Hemoglobin 01/12/2012 13.8  13.0 - 17.0 g/dL Final  . HCT 01/12/2012 41.4  39.0 - 52.0 % Final  . MCV 01/12/2012 83.6  78.0 - 100.0 fL Final  . MCH 01/12/2012 27.9  26.0 - 34.0 pg Final  . MCHC 01/12/2012 33.3  30.0 - 36.0 g/dL Final  . RDW 01/12/2012 13.1  11.5 - 15.5 % Final  . Platelets 01/12/2012 198  150 - 400 K/uL Final  . Sodium 01/12/2012 139  135 - 145 mEq/L Final  . Potassium 01/12/2012 3.9  3.5 - 5.1 mEq/L Final  . Chloride 01/12/2012 103  96 - 112 mEq/L Final  . CO2 01/12/2012 26  19 - 32 mEq/L Final  . Glucose, Bld 01/12/2012 92  70 - 99 mg/dL Final  . BUN 01/12/2012 11  6 - 23 mg/dL Final  . Creatinine, Ser 01/12/2012 0.98  0.50 - 1.35 mg/dL Final  . Calcium 01/12/2012 9.4  8.4 - 10.5 mg/dL Final  . GFR calc non Af Amer 01/12/2012 89* >90 mL/min Final  . GFR calc Af Amer 01/12/2012 >90  >90 mL/min Final   Comment:                                 The eGFR has been calculated                          using the CKD EPI equation.                          This calculation has not been                          validated in all clinical                          situations.                          eGFR's persistently                          <90 mL/min signify                          possible Chronic Kidney Disease.  . Sodium 01/12/2012 142  135 - 145  mEq/L Final  . Potassium 01/12/2012 3.9  3.5 - 5.1 mEq/L Final  . Chloride  01/12/2012 105  96 - 112 mEq/L Final  . BUN 01/12/2012 12  6 - 23 mg/dL Final  . Creatinine, Ser 01/12/2012 1.00  0.50 - 1.35 mg/dL Final  . Glucose, Bld 01/12/2012 91  70 - 99 mg/dL Final  . Calcium, Ion 01/12/2012 1.23  1.12 - 1.32 mmol/L Final  . TCO2 01/12/2012 27  0 - 100 mmol/L Final  . Hemoglobin 01/12/2012 15.3  13.0 - 17.0 g/dL Final  . HCT 01/12/2012 45.0  39.0 - 52.0 % Final  . Troponin i, poc 01/12/2012 0.00  0.00 - 0.08 ng/mL Final  . Comment 3 01/12/2012          Final   Comment: Due to the release kinetics of cTnI,                          a negative result within the first hours                          of the onset of symptoms does not rule out                          myocardial infarction with certainty.                          If myocardial infarction is still suspected,                          repeat the test at appropriate intervals.  . Troponin i, poc 01/12/2012 0.00  0.00 - 0.08 ng/mL Final  . Comment 3 01/12/2012          Final   Comment: Due to the release kinetics of cTnI,                          a negative result within the first hours                          of the onset of symptoms does not rule out                          myocardial infarction with certainty.                          If myocardial infarction is still suspected,                          repeat the test at appropriate intervals.    Dg Hand Complete Left  Result Date: 02/14/2017 CLINICAL DATA:  Left hand pain, laceration to the third finger at the middle phalanx. EXAM: LEFT HAND - COMPLETE 3+ VIEW COMPARISON:  None. FINDINGS: A 3 mm rounded cutaneous nodular density is seen along the volar aspect of the left middle finger at the level of proximal phalanx. No soft tissue foreign body, fracture nor dislocation is seen. 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 Subcortical degenerative cystic change is noted of the distal pole of the scaphoid and  lunate. IMPRESSION: No radiopaque foreign body, subcutaneous  emphysema nor acute osseous involvement status post laceration to the left middle finger. A rounding 3 mm cutaneous nodular density is seen along the volar aspect of the left middle finger for which clinical correlation is recommended. This may represent a cutaneous skin lesion such as a wart or posttraumatic blister. Electronically Signed   By: Ashley Royalty M.D.   On: 02/14/2017 23:07     Assessment/Plan   ICD-10-CM   1. Visit for suture removal Z48.02    left 2nd and 3rd finger  2. Essential hypertension I10 metoprolol succinate (TOPROL-XL) 25 MG 24 hr tablet    CMP with eGFR    CBC with Differential/Platelets    Urinalysis with Reflex Microscopic   uncontrolled  3. Mixed hyperlipidemia E78.2 Lipid Panel    TSH  4. Primary osteoarthritis involving multiple joints M15.0   5. Dry eye H04.129   6. Coronary artery disease of native artery of native heart with stable angina pectoris (HCC) I25.118 metoprolol succinate (TOPROL-XL) 25 MG 24 hr tablet    CMP with eGFR  7. History of tobacco abuse Z87.891   8. Urinary urgency R39.15 Urinalysis with Reflex Microscopic  9. Encounter for hepatitis C virus screening test for high risk patient Z11.59 Hep C Antibody   Z91.89   10. Screening for prostate cancer Z12.5 PSA  11. Screen for colon cancer Z12.11 Ambulatory referral to Gastroenterology   Resume metoprolol ER for blood pressure control and heart disease  Follow up with specialists as scheduled  6 Sutures removed from fingers without complication. Local care with washing in antibacterial soap daily.  Follow up in 1 month for CPE/EKG. Fasting labs prior to appt    [place] S. Perlie Gold  [facility] [address] [place], [place] [address] [phone] Cell (Monday-Friday 8 AM - 5 PM) [phone] After 5 PM and follow prompts

## 2017-03-23 ENCOUNTER — Encounter: Payer: Self-pay | Admitting: Internal Medicine

## 2017-05-17 ENCOUNTER — Other Ambulatory Visit: Payer: Self-pay | Admitting: Cardiology

## 2017-05-17 ENCOUNTER — Ambulatory Visit (AMBULATORY_SURGERY_CENTER): Payer: Self-pay | Admitting: *Deleted

## 2017-05-17 VITALS — Ht 71.0 in | Wt 165.6 lb

## 2017-05-17 DIAGNOSIS — Z1211 Encounter for screening for malignant neoplasm of colon: Secondary | ICD-10-CM

## 2017-05-17 DIAGNOSIS — R079 Chest pain, unspecified: Secondary | ICD-10-CM

## 2017-05-17 NOTE — Progress Notes (Signed)
Denies allergies to eggs or soy products. Denies complications with sedation or anesthesia. Denies O2 use. Denies use of diet or weight loss medications.  Emmi instructions given for colonoscopy.  

## 2017-05-25 ENCOUNTER — Encounter (HOSPITAL_COMMUNITY): Payer: Self-pay

## 2017-05-25 ENCOUNTER — Ambulatory Visit (HOSPITAL_COMMUNITY): Payer: BLUE CROSS/BLUE SHIELD | Attending: Cardiology

## 2017-05-29 ENCOUNTER — Encounter: Payer: Self-pay | Admitting: Internal Medicine

## 2017-05-29 ENCOUNTER — Telehealth: Payer: Self-pay | Admitting: Internal Medicine

## 2018-05-08 ENCOUNTER — Encounter: Payer: Self-pay | Admitting: Internal Medicine

## 2018-05-31 ENCOUNTER — Ambulatory Visit (INDEPENDENT_AMBULATORY_CARE_PROVIDER_SITE_OTHER): Payer: Managed Care, Other (non HMO) | Admitting: Podiatry

## 2018-05-31 ENCOUNTER — Encounter: Payer: Self-pay | Admitting: Podiatry

## 2018-05-31 VITALS — BP 131/81 | HR 72

## 2018-05-31 DIAGNOSIS — Q828 Other specified congenital malformations of skin: Secondary | ICD-10-CM | POA: Diagnosis not present

## 2018-05-31 DIAGNOSIS — M216X1 Other acquired deformities of right foot: Secondary | ICD-10-CM | POA: Diagnosis not present

## 2018-05-31 DIAGNOSIS — M129 Arthropathy, unspecified: Secondary | ICD-10-CM | POA: Diagnosis not present

## 2018-05-31 DIAGNOSIS — D689 Coagulation defect, unspecified: Secondary | ICD-10-CM

## 2018-05-31 NOTE — Progress Notes (Signed)
This patient presents to the office with chief complaint of a painful callus on his right forefoot.  Marland Kitchen. He says his calluses painful walking and wearing his shoes.  He says this callus has been present for years and does not know when it first began.  Marland Kitchen. He states that he has self treated with debridement of the callus himself.  This patient is not diabetic.  Marland Kitchen. He presents the office today for an evaluation and treatment of this painful callus, right foot.  Vascular  Dorsalis pedis and posterior tibial pulses are palpable  B/L.  Capillary return  WNL.  Temperature gradient is  WNL.  Skin turgor  WNL  Sensorium  Senn Weinstein monofilament wire  WNL. Normal tactile sensation.  Nail Exam  Patient has normal nails with no evidence of bacterial or fungal infection.  Orthopedic  Exam  Muscle tone and muscle strength  WNL.  No limitations of motion feet  B/L.  No crepitus or joint effusion noted.  , metatarsus primus elevatus right foot.  A relative plantarflexed second metatarsal right foot.  Skin  No open lesions.  Normal skin texture and turgor.  . 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  IE  Debridement of porokeratosis right foot.  Discussed this condition with this patient.  He has a metatarsus elevatus  first metatarsal, which allows for a plantarflexed second  metatarsal which ultimately leads to porokeratosis  right foot. Told patient that if he desires permanent correction. He needs to make an appointment with the surgeon's in the practice.  RTC 3 months for callus/porokeratosis treatment.     Helane GuntherGregory Renesha Lizama DPM

## 2018-08-30 ENCOUNTER — Ambulatory Visit: Payer: Managed Care, Other (non HMO) | Admitting: Podiatry

## 2018-09-25 ENCOUNTER — Other Ambulatory Visit: Payer: Self-pay | Admitting: Cardiology

## 2018-09-25 DIAGNOSIS — F17290 Nicotine dependence, other tobacco product, uncomplicated: Secondary | ICD-10-CM | POA: Diagnosis not present

## 2018-09-25 DIAGNOSIS — I25110 Atherosclerotic heart disease of native coronary artery with unstable angina pectoris: Secondary | ICD-10-CM | POA: Diagnosis not present

## 2018-09-25 DIAGNOSIS — R079 Chest pain, unspecified: Secondary | ICD-10-CM

## 2018-09-25 DIAGNOSIS — I10 Essential (primary) hypertension: Secondary | ICD-10-CM | POA: Diagnosis not present

## 2018-09-25 DIAGNOSIS — E785 Hyperlipidemia, unspecified: Secondary | ICD-10-CM | POA: Diagnosis not present

## 2018-09-25 DIAGNOSIS — M1990 Unspecified osteoarthritis, unspecified site: Secondary | ICD-10-CM | POA: Diagnosis not present

## 2018-10-09 ENCOUNTER — Encounter (HOSPITAL_COMMUNITY)
Admission: RE | Admit: 2018-10-09 | Discharge: 2018-10-09 | Disposition: A | Discharge status: Home / Self Care | Payer: PPO | Source: Ambulatory Visit | Attending: Cardiology | Admitting: Cardiology

## 2018-10-09 DIAGNOSIS — E785 Hyperlipidemia, unspecified: Secondary | ICD-10-CM | POA: Diagnosis not present

## 2018-10-09 DIAGNOSIS — I25110 Atherosclerotic heart disease of native coronary artery with unstable angina pectoris: Secondary | ICD-10-CM | POA: Diagnosis not present

## 2018-10-09 DIAGNOSIS — I10 Essential (primary) hypertension: Secondary | ICD-10-CM | POA: Diagnosis not present

## 2018-10-09 DIAGNOSIS — M1990 Unspecified osteoarthritis, unspecified site: Secondary | ICD-10-CM | POA: Diagnosis not present

## 2018-10-09 DIAGNOSIS — R079 Chest pain, unspecified: Secondary | ICD-10-CM | POA: Diagnosis not present

## 2018-10-09 DIAGNOSIS — F17290 Nicotine dependence, other tobacco product, uncomplicated: Secondary | ICD-10-CM | POA: Diagnosis not present

## 2018-10-09 MED ORDER — TECHNETIUM TC 99M TETROFOSMIN IV KIT
30.0000 | PACK | Freq: Once | INTRAVENOUS | Status: AC | PRN
  Administered 2018-10-09: 30 via INTRAVENOUS

## 2018-10-09 MED ORDER — REGADENOSON 0.4 MG/5ML IV SOLN
0.4000 mg | Freq: Once | INTRAVENOUS | Status: AC
  Administered 2018-10-09: 0.4 mg via INTRAVENOUS

## 2018-10-09 MED ORDER — TECHNETIUM TC 99M TETROFOSMIN IV KIT
10.0000 | PACK | Freq: Once | INTRAVENOUS | Status: AC | PRN
  Administered 2018-10-09: 10 via INTRAVENOUS

## 2018-10-09 MED ORDER — REGADENOSON 0.4 MG/5ML IV SOLN
INTRAVENOUS | Status: AC
  Filled 2018-10-09: qty 5

## 2018-10-09 NOTE — Progress Notes (Signed)
Lexiscan completed. Patient denies any complaints. Patient given beverage and snack.

## 2018-10-16 DIAGNOSIS — M7062 Trochanteric bursitis, left hip: Secondary | ICD-10-CM | POA: Diagnosis not present

## 2018-10-16 DIAGNOSIS — M25552 Pain in left hip: Secondary | ICD-10-CM | POA: Diagnosis not present

## 2018-10-16 DIAGNOSIS — M169 Osteoarthritis of hip, unspecified: Secondary | ICD-10-CM | POA: Diagnosis not present

## 2018-10-16 DIAGNOSIS — L84 Corns and callosities: Secondary | ICD-10-CM | POA: Diagnosis not present

## 2018-10-16 DIAGNOSIS — N401 Enlarged prostate with lower urinary tract symptoms: Secondary | ICD-10-CM | POA: Diagnosis not present

## 2018-10-16 DIAGNOSIS — I10 Essential (primary) hypertension: Secondary | ICD-10-CM | POA: Diagnosis not present

## 2018-10-16 DIAGNOSIS — E785 Hyperlipidemia, unspecified: Secondary | ICD-10-CM | POA: Diagnosis not present

## 2018-11-06 DIAGNOSIS — E785 Hyperlipidemia, unspecified: Secondary | ICD-10-CM | POA: Diagnosis not present

## 2018-11-06 DIAGNOSIS — I10 Essential (primary) hypertension: Secondary | ICD-10-CM | POA: Diagnosis not present

## 2018-11-06 DIAGNOSIS — R6889 Other general symptoms and signs: Secondary | ICD-10-CM | POA: Diagnosis not present

## 2018-11-06 DIAGNOSIS — J209 Acute bronchitis, unspecified: Secondary | ICD-10-CM | POA: Diagnosis not present

## 2018-11-25 DIAGNOSIS — I10 Essential (primary) hypertension: Secondary | ICD-10-CM | POA: Diagnosis not present

## 2018-11-25 DIAGNOSIS — E785 Hyperlipidemia, unspecified: Secondary | ICD-10-CM | POA: Diagnosis not present

## 2018-11-25 DIAGNOSIS — I2510 Atherosclerotic heart disease of native coronary artery without angina pectoris: Secondary | ICD-10-CM | POA: Diagnosis not present

## 2018-11-27 DIAGNOSIS — R0609 Other forms of dyspnea: Secondary | ICD-10-CM | POA: Diagnosis not present

## 2018-11-27 DIAGNOSIS — M1990 Unspecified osteoarthritis, unspecified site: Secondary | ICD-10-CM | POA: Diagnosis not present

## 2018-11-27 DIAGNOSIS — I10 Essential (primary) hypertension: Secondary | ICD-10-CM | POA: Diagnosis not present

## 2018-11-27 DIAGNOSIS — I2510 Atherosclerotic heart disease of native coronary artery without angina pectoris: Secondary | ICD-10-CM | POA: Diagnosis not present

## 2018-11-27 DIAGNOSIS — E785 Hyperlipidemia, unspecified: Secondary | ICD-10-CM | POA: Diagnosis not present

## 2018-11-27 DIAGNOSIS — F17290 Nicotine dependence, other tobacco product, uncomplicated: Secondary | ICD-10-CM | POA: Diagnosis not present

## 2018-12-02 DIAGNOSIS — F17290 Nicotine dependence, other tobacco product, uncomplicated: Secondary | ICD-10-CM | POA: Diagnosis not present

## 2018-12-02 DIAGNOSIS — E785 Hyperlipidemia, unspecified: Secondary | ICD-10-CM | POA: Diagnosis not present

## 2018-12-02 DIAGNOSIS — I10 Essential (primary) hypertension: Secondary | ICD-10-CM | POA: Diagnosis not present

## 2018-12-02 DIAGNOSIS — I2510 Atherosclerotic heart disease of native coronary artery without angina pectoris: Secondary | ICD-10-CM | POA: Diagnosis not present

## 2018-12-02 DIAGNOSIS — R0609 Other forms of dyspnea: Secondary | ICD-10-CM | POA: Diagnosis not present

## 2019-03-28 ENCOUNTER — Emergency Department (HOSPITAL_COMMUNITY): Payer: Medicare Other

## 2019-03-28 ENCOUNTER — Other Ambulatory Visit: Payer: Self-pay

## 2019-03-28 ENCOUNTER — Encounter (HOSPITAL_COMMUNITY): Payer: Self-pay | Admitting: Emergency Medicine

## 2019-03-28 ENCOUNTER — Inpatient Hospital Stay (HOSPITAL_COMMUNITY)
Admission: EM | Admit: 2019-03-28 | Discharge: 2019-03-31 | DRG: 303 | Disposition: A | Discharge status: Home / Self Care | Payer: Medicare Other | Attending: Cardiology | Admitting: Cardiology

## 2019-03-28 DIAGNOSIS — E876 Hypokalemia: Secondary | ICD-10-CM | POA: Diagnosis not present

## 2019-03-28 DIAGNOSIS — Z9861 Coronary angioplasty status: Secondary | ICD-10-CM | POA: Diagnosis not present

## 2019-03-28 DIAGNOSIS — E785 Hyperlipidemia, unspecified: Secondary | ICD-10-CM | POA: Diagnosis not present

## 2019-03-28 DIAGNOSIS — Z7952 Long term (current) use of systemic steroids: Secondary | ICD-10-CM

## 2019-03-28 DIAGNOSIS — R059 Cough, unspecified: Secondary | ICD-10-CM

## 2019-03-28 DIAGNOSIS — I249 Acute ischemic heart disease, unspecified: Secondary | ICD-10-CM | POA: Diagnosis present

## 2019-03-28 DIAGNOSIS — D573 Sickle-cell trait: Secondary | ICD-10-CM | POA: Diagnosis not present

## 2019-03-28 DIAGNOSIS — M1990 Unspecified osteoarthritis, unspecified site: Secondary | ICD-10-CM | POA: Diagnosis not present

## 2019-03-28 DIAGNOSIS — I420 Dilated cardiomyopathy: Secondary | ICD-10-CM | POA: Diagnosis not present

## 2019-03-28 DIAGNOSIS — Z87891 Personal history of nicotine dependence: Secondary | ICD-10-CM | POA: Diagnosis not present

## 2019-03-28 DIAGNOSIS — R079 Chest pain, unspecified: Secondary | ICD-10-CM

## 2019-03-28 DIAGNOSIS — R05 Cough: Secondary | ICD-10-CM

## 2019-03-28 DIAGNOSIS — I25118 Atherosclerotic heart disease of native coronary artery with other forms of angina pectoris: Principal | ICD-10-CM | POA: Diagnosis present

## 2019-03-28 DIAGNOSIS — Z7902 Long term (current) use of antithrombotics/antiplatelets: Secondary | ICD-10-CM | POA: Diagnosis not present

## 2019-03-28 DIAGNOSIS — I10 Essential (primary) hypertension: Secondary | ICD-10-CM | POA: Diagnosis not present

## 2019-03-28 DIAGNOSIS — R7989 Other specified abnormal findings of blood chemistry: Secondary | ICD-10-CM | POA: Diagnosis present

## 2019-03-28 DIAGNOSIS — J40 Bronchitis, not specified as acute or chronic: Secondary | ICD-10-CM | POA: Diagnosis not present

## 2019-03-28 DIAGNOSIS — Z7982 Long term (current) use of aspirin: Secondary | ICD-10-CM | POA: Diagnosis not present

## 2019-03-28 DIAGNOSIS — Z1159 Encounter for screening for other viral diseases: Secondary | ICD-10-CM

## 2019-03-28 DIAGNOSIS — R778 Other specified abnormalities of plasma proteins: Secondary | ICD-10-CM

## 2019-03-28 LAB — COMPREHENSIVE METABOLIC PANEL
ALT: 63 U/L — ABNORMAL HIGH (ref 0–44)
AST: 111 U/L — ABNORMAL HIGH (ref 15–41)
Albumin: 4.1 g/dL (ref 3.5–5.0)
Alkaline Phosphatase: 97 U/L (ref 38–126)
Anion gap: 12 (ref 5–15)
BUN: 8 mg/dL (ref 8–23)
CO2: 23 mmol/L (ref 22–32)
Calcium: 9.1 mg/dL (ref 8.9–10.3)
Chloride: 104 mmol/L (ref 98–111)
Creatinine, Ser: 0.82 mg/dL (ref 0.61–1.24)
GFR calc Af Amer: >60 mL/min (ref >60)
GFR calc non Af Amer: >60 mL/min (ref >60)
Glucose, Bld: 101 mg/dL — ABNORMAL HIGH (ref 70–99)
Potassium: 3.2 mmol/L — ABNORMAL LOW (ref 3.5–5.1)
Sodium: 139 mmol/L (ref 135–145)
Total Bilirubin: 1.7 mg/dL — ABNORMAL HIGH (ref 0.3–1.2)
Total Protein: 7.2 g/dL (ref 6.5–8.1)

## 2019-03-28 LAB — CBC WITH DIFFERENTIAL/PLATELET
Abs Immature Granulocytes: 0.01 10*3/uL (ref 0.00–0.07)
Basophils Absolute: 0.1 10*3/uL (ref 0.0–0.1)
Basophils Relative: 1 %
Eosinophils Absolute: 0.1 10*3/uL (ref 0.0–0.5)
Eosinophils Relative: 1 %
HCT: 40.6 % (ref 39.0–52.0)
Hemoglobin: 13.4 g/dL (ref 13.0–17.0)
Immature Granulocytes: 0 %
Lymphocytes Relative: 22 %
Lymphs Abs: 1.3 10*3/uL (ref 0.7–4.0)
MCH: 29.1 pg (ref 26.0–34.0)
MCHC: 33 g/dL (ref 30.0–36.0)
MCV: 88.1 fL (ref 80.0–100.0)
Monocytes Absolute: 0.6 10*3/uL (ref 0.1–1.0)
Monocytes Relative: 10 %
Neutro Abs: 4 10*3/uL (ref 1.7–7.7)
Neutrophils Relative %: 66 %
Platelets: 217 10*3/uL (ref 150–400)
RBC: 4.61 MIL/uL (ref 4.22–5.81)
RDW: 12.5 % (ref 11.5–15.5)
WBC: 6 10*3/uL (ref 4.0–10.5)
nRBC: 0 % (ref 0.0–0.2)

## 2019-03-28 LAB — URINALYSIS, ROUTINE W REFLEX MICROSCOPIC
Bacteria, UA: NONE SEEN
Bilirubin Urine: NEGATIVE
Glucose, UA: NEGATIVE mg/dL
Ketones, ur: 5 mg/dL — AB
Leukocytes,Ua: NEGATIVE
Nitrite: NEGATIVE
Protein, ur: NEGATIVE mg/dL
Specific Gravity, Urine: 1.012 (ref 1.005–1.030)
pH: 6 (ref 5.0–8.0)

## 2019-03-28 LAB — SARS CORONAVIRUS 2 BY RT PCR (HOSPITAL ORDER, PERFORMED IN ~~LOC~~ HOSPITAL LAB): SARS Coronavirus 2: NEGATIVE

## 2019-03-28 LAB — LACTIC ACID, PLASMA: Lactic Acid, Venous: 1.9 mmol/L (ref 0.5–1.9)

## 2019-03-28 LAB — LIPASE, BLOOD: Lipase: 49 U/L (ref 11–51)

## 2019-03-28 LAB — TROPONIN I (HIGH SENSITIVITY)
Troponin I (High Sensitivity): 46 ng/L — ABNORMAL HIGH (ref <18)
Troponin I (High Sensitivity): 52 ng/L — ABNORMAL HIGH (ref <18)

## 2019-03-28 MED ORDER — CLOPIDOGREL BISULFATE 75 MG PO TABS
75.0000 mg | ORAL_TABLET | Freq: Every day | ORAL | Status: DC
  Administered 2019-03-28 – 2019-03-31 (×4): 75 mg via ORAL
  Filled 2019-03-28 (×4): qty 1

## 2019-03-28 MED ORDER — METOPROLOL TARTRATE 25 MG PO TABS
25.0000 mg | ORAL_TABLET | Freq: Two times a day (BID) | ORAL | Status: DC
  Administered 2019-03-28 – 2019-03-31 (×6): 25 mg via ORAL
  Filled 2019-03-28 (×6): qty 1

## 2019-03-28 MED ORDER — NITROGLYCERIN 0.4 MG SL SUBL
0.4000 mg | SUBLINGUAL_TABLET | SUBLINGUAL | Status: DC | PRN
  Administered 2019-03-28 – 2019-03-31 (×16): 0.4 mg via SUBLINGUAL
  Filled 2019-03-28 (×10): qty 1

## 2019-03-28 MED ORDER — POLYSACCHARIDE IRON COMPLEX 150 MG PO CAPS
150.0000 mg | ORAL_CAPSULE | Freq: Every day | ORAL | Status: DC
  Administered 2019-03-29 – 2019-03-31 (×3): 150 mg via ORAL
  Filled 2019-03-28 (×3): qty 1

## 2019-03-28 MED ORDER — MORPHINE SULFATE (PF) 2 MG/ML IV SOLN
2.0000 mg | Freq: Once | INTRAVENOUS | Status: DC
  Filled 2019-03-28 (×2): qty 1

## 2019-03-28 MED ORDER — SODIUM CHLORIDE 0.9 % IV SOLN
INTRAVENOUS | Status: DC
  Administered 2019-03-28 – 2019-03-29 (×2): via INTRAVENOUS

## 2019-03-28 MED ORDER — ACETAMINOPHEN 325 MG PO TABS
650.0000 mg | ORAL_TABLET | ORAL | Status: DC | PRN
  Administered 2019-03-28 – 2019-03-31 (×7): 650 mg via ORAL
  Filled 2019-03-28 (×7): qty 2

## 2019-03-28 MED ORDER — LISINOPRIL 5 MG PO TABS
5.0000 mg | ORAL_TABLET | Freq: Every day | ORAL | Status: DC
  Administered 2019-03-28 – 2019-03-31 (×4): 5 mg via ORAL
  Filled 2019-03-28 (×4): qty 1

## 2019-03-28 MED ORDER — TAMSULOSIN HCL 0.4 MG PO CAPS
0.4000 mg | ORAL_CAPSULE | Freq: Every day | ORAL | Status: DC
  Administered 2019-03-28 – 2019-03-31 (×4): 0.4 mg via ORAL
  Filled 2019-03-28 (×4): qty 1

## 2019-03-28 MED ORDER — ONDANSETRON HCL 4 MG/2ML IJ SOLN: 4.0000 mg | Freq: Four times a day (QID) | INTRAMUSCULAR | Status: DC | PRN

## 2019-03-28 MED ORDER — HEPARIN (PORCINE) 25000 UT/250ML-% IV SOLN
1150.0000 [IU]/h | INTRAVENOUS | Status: DC
  Administered 2019-03-28: 800 [IU]/h via INTRAVENOUS
  Filled 2019-03-28 (×3): qty 250

## 2019-03-28 MED ORDER — NITROGLYCERIN 0.4 MG SL SUBL
0.4000 mg | SUBLINGUAL_TABLET | SUBLINGUAL | Status: AC | PRN
  Administered 2019-03-28 (×3): 0.4 mg via SUBLINGUAL
  Filled 2019-03-28: qty 1

## 2019-03-28 MED ORDER — ACETAMINOPHEN 325 MG PO TABS
650.0000 mg | ORAL_TABLET | Freq: Once | ORAL | Status: AC
  Administered 2019-03-28: 650 mg via ORAL
  Filled 2019-03-28: qty 2

## 2019-03-28 MED ORDER — POTASSIUM CHLORIDE CRYS ER 20 MEQ PO TBCR
40.0000 meq | EXTENDED_RELEASE_TABLET | Freq: Once | ORAL | Status: AC
  Administered 2019-03-28: 18:00:00 40 meq via ORAL
  Filled 2019-03-28: qty 2

## 2019-03-28 MED ORDER — PANTOPRAZOLE SODIUM 40 MG PO TBEC
40.0000 mg | DELAYED_RELEASE_TABLET | Freq: Every day | ORAL | Status: DC
  Administered 2019-03-29 – 2019-03-31 (×3): 40 mg via ORAL
  Filled 2019-03-28 (×3): qty 1

## 2019-03-28 MED ORDER — HEPARIN BOLUS VIA INFUSION
4000.0000 [IU] | Freq: Once | INTRAVENOUS | Status: AC
  Administered 2019-03-28: 4000 [IU] via INTRAVENOUS
  Filled 2019-03-28: qty 4000

## 2019-03-28 MED ORDER — POTASSIUM CHLORIDE CRYS ER 20 MEQ PO TBCR
20.0000 meq | EXTENDED_RELEASE_TABLET | Freq: Once | ORAL | Status: AC
  Administered 2019-03-28: 20 meq via ORAL
  Filled 2019-03-28: qty 1

## 2019-03-28 MED ORDER — ASPIRIN 81 MG PO CHEW
324.0000 mg | CHEWABLE_TABLET | ORAL | Status: AC
  Administered 2019-03-28: 324 mg via ORAL
  Filled 2019-03-28: qty 4

## 2019-03-28 MED ORDER — ASPIRIN EC 81 MG PO TBEC
81.0000 mg | DELAYED_RELEASE_TABLET | Freq: Every day | ORAL | Status: DC
  Administered 2019-03-29 – 2019-03-31 (×3): 81 mg via ORAL
  Filled 2019-03-28 (×4): qty 1

## 2019-03-28 MED ORDER — GUAIFENESIN-DM 100-10 MG/5ML PO SYRP
5.0000 mL | ORAL_SOLUTION | ORAL | Status: DC | PRN
  Administered 2019-03-28 – 2019-03-30 (×3): 5 mL via ORAL
  Filled 2019-03-28 (×3): qty 5

## 2019-03-28 MED ORDER — VITAMIN C 500 MG PO TABS
500.0000 mg | ORAL_TABLET | Freq: Every day | ORAL | Status: DC
  Administered 2019-03-28 – 2019-03-31 (×4): 500 mg via ORAL
  Filled 2019-03-28 (×4): qty 1

## 2019-03-28 MED ORDER — ATORVASTATIN CALCIUM 40 MG PO TABS
40.0000 mg | ORAL_TABLET | Freq: Every day | ORAL | Status: DC
  Administered 2019-03-28 – 2019-03-30 (×3): 40 mg via ORAL
  Filled 2019-03-28 (×3): qty 1

## 2019-03-28 MED ORDER — NITROGLYCERIN 2 % TD OINT
0.5000 [in_us] | TOPICAL_OINTMENT | Freq: Four times a day (QID) | TRANSDERMAL | Status: DC
  Administered 2019-03-28 – 2019-03-31 (×11): 0.5 [in_us] via TOPICAL
  Filled 2019-03-28: qty 1
  Filled 2019-03-28: qty 30

## 2019-03-28 MED ORDER — ASPIRIN 300 MG RE SUPP: 300.0000 mg | RECTAL | Status: AC

## 2019-03-28 NOTE — ED Triage Notes (Signed)
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 Took a nitro spray with some relief.

## 2019-03-28 NOTE — ED Notes (Signed)
ED TO INPATIENT HANDOFF REPORT  ED Nurse Name and Phone #: Letita Prentiss [phone]  S Name/Age/Gender [name] [name] 68 y.o. male Room/Bed: 057C/057C  Code Status   Code Status: Full Code  Home/SNF/Other Home Patient oriented to: self, place, time and situation Is this baseline? 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 Took a nitro spray with some relief.    Allergies Allergies  Allergen Reactions  . Other     crustaceans?    Level of Care/Admitting Diagnosis ED Disposition    ED Disposition Condition Comment   Admit  [facility]ospital [facility]: [facility] [[id number]]  Level of Care: Progressive [102]  Covid Evaluation: Asymptomatic Screening Protocol (No Symptoms)  Diagnosis: Acute coronary syndrome [facility](HCC) [161096][[id number]]  Admitting Physician: Rinaldo CloudHARWANI, MOHAN [[id number]]  Attending Physician: Rinaldo CloudHARWANI, MOHAN [[id number]]  PT Class (Do Not Modify): Observation [104]  PT Acc Code (Do Not Modify): Observation [10022]       B Medical/Surgery History Past Medical History:  Diagnosis Date  . Arthritis   . Cataracts, bilateral   . Hyperlipidemia   . Hypertension   . Seasonal allergies   . Sickle cell trait [facility](HCC)    Past Surgical History:  Procedure Laterality Date  . APPENDECTOMY    . KNEE ARTHROSCOPY WITH ANTERIOR CRUCIATE LIGAMENT (ACL) REPAIR Left   . STENT PLACEMENT VASCULAR ([facility] HX)       A IV Location/Drains/Wounds Patient Lines/Drains/Airways Status   Active Line/Drains/Airways    Name:   Placement date:   Placement time:   Site:   Days:   Peripheral IV 03/28/19 Right;Upper Forearm   03/28/19    1209    Forearm   less than 1   Wound / Incision (Open or Dehisced) 02/14/17 Laceration Finger (Comment which one) Left LACERATION UNDER 2ND FINGER AND LACERATION TO 3RD FINGER KNUCKLE     02/14/17    2220    Finger (Comment which one)   772          Intake/Output Last 24 hours No intake or output data in the 24 hours ending 03/28/19 1825  Labs/Imaging Results for orders placed or performed during the hospital encounter of 03/28/19 (from the past 48 hour(s))  CBC with Differential     Status: None   Collection Time: 03/28/19 12:06 PM  Result Value Ref Range   WBC 6.0 4.0 - 10.5 K/uL   RBC 4.61 4.22 - 5.81 MIL/uL   Hemoglobin 13.4 13.0 - 17.0 g/dL   HCT 04.540.6 40.939.0 - 81.152.0 %   MCV 88.1 80.0 - 100.0 fL   MCH 29.1 26.0 - 34.0 pg   MCHC 33.0 30.0 - 36.0 g/dL   RDW 91.412.5 78.211.5 - 95.615.5 %   Platelets 217 150 - 400 K/uL   nRBC 0.0 0.0 - 0.2 %   Neutrophils Relative % 66 %   Neutro Abs 4.0 1.7 - 7.7 K/uL   Lymphocytes Relative 22 %   Lymphs Abs 1.3 0.7 - 4.0 K/uL   Monocytes Relative 10 %   Monocytes Absolute 0.6 0.1 - 1.0 K/uL   Eosinophils Relative 1 %   Eosinophils Absolute 0.1 0.0 - 0.5 K/uL   Basophils Relative 1 %   Basophils Absolute 0.1 0.0 - 0.1 K/uL   Immature Granulocytes 0 %  Abs Immature Granulocytes 0.01 0.00 - 0.07 K/uL    Comment: Performed at [facility], [address] [place], [place] [address]  Comprehensive metabolic panel     Status: Abnormal   Collection Time: 03/28/19 12:06 PM  Result Value Ref Range   Sodium 139 135 - 145 mmol/L   Potassium 3.2 (L) 3.5 - 5.1 mmol/L   Chloride 104 98 - 111 mmol/L   CO2 23 22 - 32 mmol/L   Glucose, Bld 101 (H) 70 - 99 mg/dL   BUN 8 8 - 23 mg/dL   Creatinine, Ser 2.440.82 0.61 - 1.24 mg/dL   Calcium 9.1 8.9 - 01.010.3 mg/dL   Total Protein 7.2 6.5 - 8.1 g/dL   Albumin 4.1 3.5 - 5.0 g/dL   AST 272111 (H) 15 - 41 U/L   ALT 63 (H) 0 - 44 U/L   Alkaline Phosphatase 97 38 - 126 U/L   Total Bilirubin 1.7 (H) 0.3 - 1.2 mg/dL   GFR calc non Af Amer >60 >60 mL/min   GFR calc Af Amer >60 >60 mL/min   Anion gap 12 5 - 15    Comment: Performed at [facility], [address] [place], [place] [address]  Troponin  I (High Sensitivity)     Status: Abnormal   Collection Time: 03/28/19 12:06 PM  Result Value Ref Range   Troponin I (High Sensitivity) 46 (H) <18 ng/L    Comment: (NOTE) Elevated high sensitivity troponin I (hsTnI) values and significant  changes across serial measurements may suggest ACS but many other  chronic and acute conditions are known to elevate hsTnI results.  Refer to the "Links" section for chest pain algorithms and additional  guidance. Performed at [facility], [address] [place], [place] [address]   Lipase, blood     Status: None   Collection Time: 03/28/19 12:06 PM  Result Value Ref Range   Lipase 49 11 - 51 U/L    Comment: Performed at [facility], [address] [place], [place] [address]  Lactic acid, plasma     Status: None   Collection Time: 03/28/19 12:06 PM  Result Value Ref Range   Lactic Acid, Venous 1.9 0.5 - 1.9 mmol/L    Comment: Performed at [facility], [address] [place], [place] [address]  SARS Coronavirus 2 (CEPHEID- Performed in [facility] hospital lab), Hosp Order     Status: None   Collection Time: 03/28/19 12:27 PM   Specimen: Nasopharyngeal Swab  Result Value Ref Range   SARS Coronavirus 2 NEGATIVE NEGATIVE    Comment: (NOTE) If result is NEGATIVE SARS-CoV-2 target nucleic acids are NOT DETECTED. The SARS-CoV-2 RNA is generally detectable in upper and lower  respiratory specimens during the acute phase of infection. The lowest  concentration of SARS-CoV-2 viral copies this assay can detect is 250  copies / mL. A negative result does not preclude SARS-CoV-2 infection  and should not be used as the sole basis for treatment or other  patient management decisions.  A negative result may occur with  improper specimen collection / handling, submission of specimen other  than nasopharyngeal swab, presence of viral mutation(s) within the  areas targeted by this assay, and inadequate number of viral copies  (<250  copies / mL). A negative result must be combined with clinical  observations, patient history, and epidemiological information. If result is POSITIVE SARS-CoV-2 target nucleic acids are DETECTED. The SARS-CoV-2  RNA is generally detectable in upper and lower  respiratory specimens dur ing the acute phase of infection.  Positive  results are indicative of active infection with SARS-CoV-2.  Clinical  correlation with patient history and other diagnostic information is  necessary to determine patient infection status.  Positive results do  not rule out bacterial infection or co-infection with other viruses. If result is PRESUMPTIVE POSTIVE SARS-CoV-2 nucleic acids MAY BE PRESENT.   A presumptive positive result was obtained on the submitted specimen  and confirmed on repeat testing.  While 2019 novel coronavirus  (SARS-CoV-2) nucleic acids may be present in the submitted sample  additional confirmatory testing may be necessary for epidemiological  and / or clinical management purposes  to differentiate between  SARS-CoV-2 and other Sarbecovirus currently known to infect humans.  If clinically indicated additional testing with an alternate test  methodology [id number]) is advised. The SARS-CoV-2 RNA is generally  detectable in upper and lower respiratory sp ecimens during the acute  phase of infection. The expected result is Negative. Fact Sheet for Patients:  StrictlyIdeas.no Fact Sheet for Healthcare Providers: BankingDealers.co.za This test is not yet approved or cleared by the [place] FDA and has been authorized for detection and/or diagnosis of SARS-CoV-2 by FDA under an Emergency Use Authorization (EUA).  This EUA will remain in effect (meaning this test can be used) for the duration of the COVID-19 declaration under Section 564(b)(1) of the Act, 21 U.S.C. section 360bbb-3(b)(1), unless the authorization is terminated or revoked  sooner. Performed at [facility], [place] [address] [place], [place] [address]   Urinalysis, Routine w reflex microscopic     Status: Abnormal   Collection Time: 03/28/19  1:38 PM  Result Value Ref Range   Color, Urine YELLOW YELLOW   APPearance CLEAR CLEAR   Specific Gravity, Urine 1.012 1.005 - 1.030   pH 6.0 5.0 - 8.0   Glucose, UA NEGATIVE NEGATIVE mg/dL   Hgb urine dipstick MODERATE (A) NEGATIVE   Bilirubin Urine NEGATIVE NEGATIVE   Ketones, ur 5 (A) NEGATIVE mg/dL   Protein, ur NEGATIVE NEGATIVE mg/dL   Nitrite NEGATIVE NEGATIVE   Leukocytes,Ua NEGATIVE NEGATIVE   RBC / HPF 0-5 0 - 5 RBC/hpf   WBC, UA 0-5 0 - 5 WBC/hpf   Bacteria, UA NONE SEEN NONE SEEN   Squamous Epithelial / LPF 0-5 0 - 5    Comment: Performed at [facility], [place] [address], [place], [place] [address]  Troponin I (High Sensitivity)     Status: Abnormal   Collection Time: 03/28/19  2:14 PM  Result Value Ref Range   Troponin I (High Sensitivity) 52 (H) <18 ng/L    Comment: (NOTE) Elevated high sensitivity troponin I (hsTnI) values and significant  changes across serial measurements may suggest ACS but many other  chronic and acute conditions are known to elevate hsTnI results.  Refer to the "Links" section for chest pain algorithms and additional  guidance. Performed at [facility], [place] [address], [place], [place] [address]    Dg Chest Portable 1 View  Result Date: 03/28/2019 CLINICAL DATA:  Cough, shortness of breath. EXAM: PORTABLE CHEST 1 VIEW COMPARISON:  Radiographs of January 12, 2012. FINDINGS: The heart size and mediastinal contours are within normal limits. Both lungs are clear. No pneumothorax or pleural effusion is noted. The visualized skeletal structures are unremarkable. IMPRESSION: No active disease. Electronically Signed   By: Marijo Conception M.D.   On:  03/28/2019 12:34    Pending Labs Unresulted Labs (From admission, onward)    Start     Ordered   03/30/19 0500   Heparin level (unfractionated)  Daily,   R     03/28/19 1703   03/30/19 0500  CBC  Daily,   R     03/28/19 1703   03/29/19 0500  Lipid panel  Tomorrow morning,   R     03/28/19 1652   03/29/19 0500  Basic metabolic panel  Tomorrow morning,   R     03/28/19 1652   03/29/19 0500  CBC  Tomorrow morning,   R     03/28/19 1652   03/29/19 0000  Heparin level (unfractionated)  Once-Timed,   STAT     03/28/19 1703   03/28/19 1653  HIV antibody (Routine Testing)  Once,   STAT     03/28/19 1652   03/28/19 1206  Urine culture  ONCE - STAT,   STAT     03/28/19 1207          Vitals/Pain Today's Vitals   03/28/19 1715 03/28/19 1730 03/28/19 1745 03/28/19 1815  BP: (!) 144/88 (!) 160/92 (!) 151/85 (!) 149/81  Pulse:    82  Resp: 19 20 (!) 23 18  Temp:      SpO2:    99%  Weight:      Height:      PainSc: 3        Isolation Precautions No active isolations  Medications Medications  tamsulosin (FLOMAX) capsule 0.4 mg (0.4 mg Oral Given 03/28/19 1808)  clopidogrel (PLAVIX) tablet 75 mg (75 mg Oral Given 03/28/19 1809)  iron polysaccharides (NIFEREX) capsule 150 mg (has no administration in time range)  vitamin C (ASCORBIC ACID) tablet 500 mg (500 mg Oral Given 03/28/19 1808)  aspirin EC tablet 81 mg (has no administration in time range)  nitroGLYCERIN (NITROSTAT) SL tablet 0.4 mg (0.4 mg Sublingual Given 03/28/19 1807)  acetaminophen (TYLENOL) tablet 650 mg (has no administration in time range)  ondansetron (ZOFRAN) injection 4 mg (has no administration in time range)  0.9 %  sodium chloride infusion ( Intravenous New Bag/Given 03/28/19 1812)  nitroGLYCERIN (NITROGLYN) 2 % ointment 0.5 inch (0.5 inches Topical Given 03/28/19 1700)  metoprolol tartrate (LOPRESSOR) tablet 25 mg (has no administration in time range)  atorvastatin (LIPITOR) tablet 40 mg (40 mg Oral Given 03/28/19 1809)  pantoprazole (PROTONIX) EC tablet 40 mg (has no administration in time range)  heparin ADULT infusion 100  units/mL (25000 units/28450mL sodium chloride 0.45%) (800 Units/hr Intravenous New Bag/Given 03/28/19 1735)  nitroGLYCERIN (NITROSTAT) SL tablet 0.4 mg (0.4 mg Sublingual Given 03/28/19 1500)  acetaminophen (TYLENOL) tablet 650 mg (650 mg Oral Given 03/28/19 1227)  aspirin chewable tablet 324 mg (324 mg Oral Given 03/28/19 1808)    Or  aspirin suppository 300 mg ( Rectal See Alternative 03/28/19 1808)  potassium chloride SA (K-DUR) CR tablet 40 mEq (40 mEq Oral Given 03/28/19 1808)  heparin bolus via infusion 4,000 Units (4,000 Units Intravenous Bolus from Bag 03/28/19 1736)    Mobility walks with device High fall risk   Focused Assessments Cardiac Assessment Handoff:    Lab Results  Component Value Date   CKTOTAL 67 06/08/2009   CKMB 1.6 06/08/2009   TROPONINI 0.03        NO INDICATION OF MYOCARDIAL INJURY. 06/08/2009   No results found for: DDIMER Does the Patient currently have chest pain? Yes     R  Recommendations: See Admitting Provider Note  Report given to:   Additional Notes:

## 2019-03-28 NOTE — ED Provider Notes (Signed)
[facility] Provider Note   CSN: [id number] Arrival date & time: 03/28/19  1136     History   Chief Complaint Chief Complaint  Patient presents with  . Chest Pain  . Back Pain    HPI [name] [name] is a 68 y.o. male.     The history is provided by medical records and the patient. No language interpreter was used.  Chest Pain Pain location:  Substernal area Pain quality: aching, crushing and pressure   Pain quality: not radiating and not sharp   Pain radiates to:  Does not radiate Pain severity:  Severe Onset quality:  Gradual Duration:  3 days Timing:  Constant Progression:  Waxing and waning Chronicity:  Recurrent Relieved by:  Nitroglycerin Worsened by:  Exertion Ineffective treatments:  None tried Associated symptoms: cough, fatigue and shortness of breath   Associated symptoms: no abdominal pain, no altered mental status, no anxiety, no back pain, no diaphoresis, no dizziness, no fever, no headache, no heartburn, no lower extremity edema, no nausea, no near-syncope, no palpitations, no vomiting and no weakness   Risk factors: coronary artery disease, hypertension and male sex   Risk factors: no diabetes mellitus     Past Medical History:  Diagnosis Date  . Arthritis   . Cataracts, bilateral   . Hyperlipidemia   . Hypertension   . Seasonal allergies   . Sickle cell trait (HCC)     There are no active problems to display for this patient.   Past Surgical History:  Procedure Laterality Date  . APPENDECTOMY    . KNEE ARTHROSCOPY WITH ANTERIOR CRUCIATE LIGAMENT (ACL) REPAIR Left   . STENT PLACEMENT VASCULAR ([facility] HX)          Home Medications    Prior to Admission medications   Medication Sig Start Date End Date Taking? Authorizing Provider  aspirin 81 MG EC tablet TAKE 1 TABLET BY MOUTH EVERY DAY 04/29/18   [provider]  atorvastatin (LIPITOR) 20 MG tablet Take 20 mg by mouth daily. 05/26/18    [provider]  clopidogrel (PLAVIX) 75 MG tablet Take 75 mg by mouth daily. 04/30/18   [provider]  metoprolol succinate (TOPROL-XL) 25 MG 24 hr tablet Take 1 tablet (25 mg total) by mouth daily. 03/02/17   Kirt Boysarter, Monica, DO  NAPROXEN 375 MG TBEC EC tablet Take 1 tablet by mouth 2 (two) times daily as needed. 05/27/18   [provider]  nitroGLYCERIN (NITROLINGUAL) 0.4 MG/SPRAY spray Place 1 spray under the tongue every 5 (five) minutes as needed. For chest pain    [provider]  predniSONE (DELTASONE) 5 MG tablet prednisone 5 mg tablets in a dose pack    [provider]  tamsulosin (FLOMAX) 0.4 MG CAPS capsule TAKE 1 CAPSULE BY MOUTH EVERYDAY AT BEDTIME 05/23/18   [provider]  traMADol (ULTRAM) 50 MG tablet tramadol 50 mg tablet    [provider]    Family History Family History  Problem Relation Age of Onset  . Colon cancer Neg Hx   . Esophageal cancer Neg Hx   . Rectal cancer Neg Hx   . Stomach cancer Neg Hx     Social History Social History   Tobacco Use  . Smoking status: Former Smoker    Quit date: 09/18/2008    Years since quitting: 10.5  . Smokeless tobacco: Former NeurosurgeonUser    Types: Chew    Quit date: 09/18/1970  Substance  Use Topics  . Alcohol use: Yes    Comment: occas  . Drug use: No     Allergies   Other   Review of Systems Review of Systems  Constitutional: Positive for chills and fatigue. Negative for diaphoresis and fever.  HENT: Negative for congestion.   Eyes: Negative for photophobia and visual disturbance.  Respiratory: Positive for cough, chest tightness and shortness of breath. Negative for wheezing and stridor.   Cardiovascular: Positive for chest pain. Negative for palpitations, leg swelling and near-syncope.  Gastrointestinal: Negative for abdominal pain, constipation, diarrhea, heartburn, nausea and vomiting.  Genitourinary: Negative for flank pain and frequency.  Musculoskeletal:  Negative for back pain, neck pain and neck stiffness.  Skin: Negative for rash and wound.  Neurological: Negative for dizziness, weakness, light-headedness and headaches.  Psychiatric/Behavioral: Negative for agitation and confusion.  All other systems reviewed and are negative.    Physical Exam Updated Vital Signs BP (!) 166/97   Pulse 70   Temp 98.6 F (37 C)   Resp 18   SpO2 99%   Physical Exam Vitals signs and nursing note reviewed.  Constitutional:      General: He is not in acute distress.    Appearance: He is well-developed. He is not ill-appearing, toxic-appearing or diaphoretic.  HENT:     Head: Normocephalic and atraumatic.  Eyes:     Extraocular Movements: Extraocular movements intact.     Conjunctiva/sclera: Conjunctivae normal.     Pupils: Pupils are equal, round, and reactive to light.  Neck:     Musculoskeletal: Neck supple.  Cardiovascular:     Rate and Rhythm: Normal rate and regular rhythm.     Heart sounds: No murmur.  Pulmonary:     Effort: Pulmonary effort is normal. No tachypnea or respiratory distress.     Breath sounds: Normal breath sounds. No decreased breath sounds, wheezing, rhonchi or rales.  Chest:     Chest wall: No tenderness.  Abdominal:     Palpations: Abdomen is soft.     Tenderness: There is no abdominal tenderness.  Musculoskeletal:     Right lower leg: He exhibits no tenderness. No edema.     Left lower leg: He exhibits no tenderness. No edema.  Skin:    General: Skin is warm and dry.     Capillary Refill: Capillary refill takes less than 2 seconds.     Findings: No rash.  Neurological:     General: No focal deficit present.     Mental Status: He is alert and oriented to person, place, and time.     Cranial Nerves: No cranial nerve deficit.  Psychiatric:        Mood and Affect: Mood normal. Mood is not anxious.      ED Treatments / Results  Labs (all labs ordered are listed, but only abnormal results are displayed)  Labs Reviewed  COMPREHENSIVE METABOLIC PANEL - Abnormal; Notable for the following components:      Result Value   Potassium 3.2 (*)    Glucose, Bld 101 (*)    AST 111 (*)    ALT 63 (*)    Total Bilirubin 1.7 (*)    All other components within normal limits  URINALYSIS, ROUTINE W REFLEX MICROSCOPIC - Abnormal; Notable for the following components:   Hgb urine dipstick MODERATE (*)    Ketones, ur 5 (*)    All other components within normal limits  TROPONIN I (HIGH SENSITIVITY) - Abnormal; Notable for the following components:  Troponin I (High Sensitivity) 46 (*)    All other components within normal limits  TROPONIN I (HIGH SENSITIVITY) - Abnormal; Notable for the following components:   Troponin I (High Sensitivity) 52 (*)    All other components within normal limits  SARS CORONAVIRUS 2 (HOSPITAL ORDER, PERFORMED IN [facility])  URINE CULTURE  CBC WITH DIFFERENTIAL/PLATELET  LIPASE, BLOOD  LACTIC ACID, PLASMA    EKG EKG Interpretation  Date/Time:  Friday March 28 2019 11:48:45 EDT Ventricular Rate:  85 PR Interval:  124 QRS Duration: 86 QT Interval:  378 QTC Calculation: 449 R Axis:   71 Text Interpretation:  Normal sinus rhythm Biatrial enlargement Abnormal ECG When compared to prior, no significant changes seen, similar tall t wave in leads V3-V4.  No STEMI Confirmed by Theda Belfastegeler, Chris ([id number]) on 03/28/2019 11:48:52 AM   Radiology Dg Chest Portable 1 View  Result Date: 03/28/2019 CLINICAL DATA:  Cough, shortness of breath. EXAM: PORTABLE CHEST 1 VIEW COMPARISON:  Radiographs of January 12, 2012. FINDINGS: The heart size and mediastinal contours are within normal limits. Both lungs are clear. No pneumothorax or pleural effusion is noted. The visualized skeletal structures are unremarkable. IMPRESSION: No active disease. Electronically Signed   By: Lupita RaiderJames  Green Jr M.D.   On: 03/28/2019 12:34    Procedures Procedures (including critical care time)  [name] LeeWilliam A  Schoeppner was evaluated in Emergency Department on 03/28/2019 for the symptoms described in the history of present illness. He was evaluated in the context of the global COVID-19 pandemic, which necessitated consideration that the patient might be at risk for infection with the SARS-CoV-2 virus that causes COVID-19. Institutional protocols and algorithms that pertain to the evaluation of patients at risk for COVID-19 are in a state of rapid change based on information released by regulatory bodies including the CDC and federal and state organizations. These policies and algorithms were followed during the patient's care in the ED.  CRITICAL CARE Performed by: Canary Brimhristopher J Briscoe Daniello Total critical care time: 35 minutes Critical care time was exclusive of separately billable procedures and treating other patients. Chest pain similar to prior MI with rising troponin.   Critical care was necessary to treat or prevent imminent or life-threatening deterioration. Critical care was time spent personally by me on the following activities: development of treatment plan with patient and/or surrogate as well as nursing, discussions with consultants, evaluation of patient's response to treatment, examination of patient, obtaining history from patient or surrogate, ordering and performing treatments and interventions, ordering and review of laboratory studies, ordering and review of radiographic studies, pulse oximetry and re-evaluation of patient's condition.   Medications Ordered in ED Medications  nitroGLYCERIN (NITROSTAT) SL tablet 0.4 mg (0.4 mg Sublingual Given 03/28/19 1500)  acetaminophen (TYLENOL) tablet 650 mg (650 mg Oral Given 03/28/19 1227)     Initial Impression / Assessment and Plan / ED Course  I have reviewed the triage vital signs and the nursing notes.  Pertinent labs & imaging results that were available during my care of the patient were reviewed by me and considered in my medical decision making  (see chart for details).        [name] [name] is a 68 y.o. male with a past medical history significant for hypertension, hyperlipidemia, CAD status post PCI who presents with chills, fatigue, chest pain, shortness of breath, cough, and malaise.  Patient is sent in by PCP for evaluation and work-up for possible coronavirus versus other etiology.  Patient reports  that for the last 2 weeks he has had chills and occasional night sweats.  He reports he is having worsening shortness of breath that is worsened with exertion.  He also reports the last 3 days he has been having constant chest pain.  He reports that it feels similar to his prior MI discomfort when he had to get stents.  He describes as "an elephant foot right on my chest".  It does not radiate and he denies any associated nausea, vomiting, palpitations, or syncope.  He reports no constipation, diarrhea, or urinary symptoms.  He denies any trauma.  He denies a history of DVT or PE and reports that [place] went to [place] several weeks ago and is unsure of possible coronavirus exposure.  He denies other complaints on arrival.  He describes his chest pain is 8 out of 10 severity on arrival and reports that nitro spray helped his pain earlier down from a 10 out of 10.  On exam, patient has metric pulses in all extremities.  Lungs are clear and chest is nontender.  Abdomen is nontender.  Patient's discomfort was not reproducible on my exam.  No murmur on my exam.  No CVA tenderness or back tenderness.  Patient's vital signs showed hypertension but otherwise he was not tachycardic or febrile on arrival.  EKG shows no STEMI.  Clinically I am concerned that the patient reports his chest pain is similar to his prior MI.  He will be given nitroglycerin sublingually as well as Tylenol at his request for prior headache.  He reports he is never had liver problems and would like the Tylenol prior to LFTs returning.  He will get chest x-ray to look for  pneumonia as he feels this could be pneumonia.  He will also have screening labs.  We will also monitor his blood pressure to see if nitroglycerin will help with this.  Lower suspicion for hypertensive emergency, dissection, or pulmonary embolism at this time.  He reports the chest pain is in the front and not radiating straight through to his back on my evaluation.  Anticipate touching base with his cardiologist after work-up is been completed given the high risk chest pain.  Anticipate admission for further management.  Coronavirus test will be sent.   Patient's troponin returned elevated.  It is up trending.  Spoke with Dr. Sharyn LullHarwani with cardiology who will come see the patient.  Anticipate admission for further management of chest pain similar to prior MI with rising troponin.  Coronavirus test negative.  Chest x-ray reassuring.  Cardiology wanted to see patient before starting heparin.  Will defer further management to cardiology.    Final Clinical Impressions(s) / ED Diagnoses   Final diagnoses:  Chest pain, unspecified type  Elevated troponin     Clinical Impression: 1. Chest pain, unspecified type   2. Elevated troponin     Disposition: Admit  This note was prepared with assistance of Dragon voice recognition software. Occasional wrong-word or sound-a-like substitutions may have occurred due to the inherent limitations of voice recognition software.     Jeyren Danowski, Canary Brimhristopher J, MD 03/28/19 [phone]

## 2019-03-28 NOTE — Progress Notes (Signed)
ANTICOAGULATION CONSULT NOTE - Initial Consult  Pharmacy Consult for heparin Indication: chest pain/ACS  Allergies  Allergen Reactions  . Other     crustaceans?    Patient Measurements: Height: 5\' 11"  (180.3 cm) Weight: 146 lb (66.2 kg) IBW/kg (Calculated) : 75.3 Heparin Dosing Weight: 66.2kg  Vital Signs: Temp: 98.6 F (37 C) (07/10 1204) BP: 166/97 (07/10 1445) Pulse Rate: 70 (07/10 1508)  Labs: Recent Labs    03/28/19 1206 03/28/19 1414  HGB 13.4  --   HCT 40.6  --   PLT 217  --   CREATININE 0.82  --   TROPONINIHS 46* 52*    Estimated Creatinine Clearance: 84.1 mL/min (by C-G formula based on SCr of 0.82 mg/dL).   Medical History: Past Medical History:  Diagnosis Date  . Arthritis   . Cataracts, bilateral   . Hyperlipidemia   . Hypertension   . Seasonal allergies   . Sickle cell trait (HCC)     Medications:  Infusions:  . sodium chloride    . heparin      Assessment: 70 yom presented to the ED with CP. Troponin minimally elevated but with known history of CAD. To start IV heparin. Baseline CBC is WNL. He is not on anticoagulation PTA.   Goal of Therapy:  Heparin level 0.3-0.7 units/ml Monitor platelets by anticoagulation protocol: Yes   Plan:  Heparin bolus 4000 units IV x 1 Heparin gtt 800 units/hr Check a 6 hr heparin level Daily heparin level and CBC  Rashidi Loh, Rande Lawman 03/28/2019,5:04 PM

## 2019-03-28 NOTE — H&P (Signed)
[name] [name] is an 68 y.o. male.   Chief Complaint: retrosternal and left-sided chest pain ZOX:WRUEAVWHPI:patient is 68 year old male with past history significant for coronary artery disease status post PCI to LAD and remote past, hypertension, hyperlipidemia, tobacco abuse, osteoarthritis, came to the ER complaining of retrosternal and left-sided chest pain off and on for the last 3 days associated with diaphoresis.  Denies PND, orthopnea or leg swelling.  Denies palpitation, lightheadedness or syncope.  EKG done in the ED showed normal sinus rhythm with no acute ischemic changes.  High sensitivity troponin are very minimally elevated.  Past Medical History:  Diagnosis Date  . Arthritis   . Cataracts, bilateral   . Hyperlipidemia   . Hypertension   . Seasonal allergies   . Sickle cell trait [facility](HCC)     Past Surgical History:  Procedure Laterality Date  . APPENDECTOMY    . KNEE ARTHROSCOPY WITH ANTERIOR CRUCIATE LIGAMENT (ACL) REPAIR Left   . STENT PLACEMENT VASCULAR ([facility] HX)      Family History  Problem Relation Age of Onset  . Colon cancer Neg Hx   . Esophageal cancer Neg Hx   . Rectal cancer Neg Hx   . Stomach cancer Neg Hx    Social History:  reports that he quit smoking about 10 years ago. He quit smokeless tobacco use about 48 years ago.  His smokeless tobacco use included chew. He reports current alcohol use. He reports that he does not use drugs.  Allergies:  Allergies  Allergen Reactions  . Other     crustaceans?    (Not in a hospital admission)   Results for orders placed or performed during the hospital encounter of 03/28/19 (from the past 48 hour(s))  CBC with Differential     Status: None   Collection Time: 03/28/19 12:06 PM  Result Value Ref Range   WBC 6.0 4.0 - 10.5 K/uL   RBC 4.61 4.22 - 5.81 MIL/uL   Hemoglobin 13.4 13.0 - 17.0 g/dL   HCT 09.840.6 11.939.0 - 14.752.0 %   MCV 88.1 80.0 - 100.0 fL   MCH 29.1 26.0 - 34.0 pg   MCHC 33.0 30.0 - 36.0 g/dL   RDW 82.912.5 56.211.5 -  13.015.5 %   Platelets 217 150 - 400 K/uL   nRBC 0.0 0.0 - 0.2 %   Neutrophils Relative % 66 %   Neutro Abs 4.0 1.7 - 7.7 K/uL   Lymphocytes Relative 22 %   Lymphs Abs 1.3 0.7 - 4.0 K/uL   Monocytes Relative 10 %   Monocytes Absolute 0.6 0.1 - 1.0 K/uL   Eosinophils Relative 1 %   Eosinophils Absolute 0.1 0.0 - 0.5 K/uL   Basophils Relative 1 %   Basophils Absolute 0.1 0.0 - 0.1 K/uL   Immature Granulocytes 0 %   Abs Immature Granulocytes 0.01 0.00 - 0.07 K/uL    Comment: Performed at [facility], [address] [place], [place] [address]  Comprehensive metabolic panel     Status: Abnormal   Collection Time: 03/28/19 12:06 PM  Result Value Ref Range   Sodium 139 135 - 145 mmol/L   Potassium 3.2 (L) 3.5 - 5.1 mmol/L   Chloride 104 98 - 111 mmol/L   CO2 23 22 - 32 mmol/L   Glucose, Bld 101 (H) 70 - 99 mg/dL   BUN 8 8 - 23 mg/dL   Creatinine, Ser 4.690.82 0.61 - 1.24 mg/dL   Calcium 9.1 8.9 - 62.910.3 mg/dL  Total Protein 7.2 6.5 - 8.1 g/dL   Albumin 4.1 3.5 - 5.0 g/dL   AST 111 (H) 15 - 41 U/L   ALT 63 (H) 0 - 44 U/L   Alkaline Phosphatase 97 38 - 126 U/L   Total Bilirubin 1.7 (H) 0.3 - 1.2 mg/dL   GFR calc non Af Amer >60 >60 mL/min   GFR calc Af Amer >60 >60 mL/min   Anion gap 12 5 - 15    Comment: Performed at [facility] [address] [place], [place] [address]  Troponin I (High Sensitivity)     Status: Abnormal   Collection Time: 03/28/19 12:06 PM  Result Value Ref Range   Troponin I (High Sensitivity) 46 (H) <18 ng/L    Comment: (NOTE) Elevated high sensitivity troponin I (hsTnI) values and significant  changes across serial measurements may suggest ACS but many other  chronic and acute conditions are known to elevate hsTnI results.  Refer to the "Links" section for chest pain algorithms and additional  guidance. Performed at [facility], [place] [address] [place], [place] [address]   Lipase, blood     Status: None   Collection Time: 03/28/19 12:06  PM  Result Value Ref Range   Lipase 49 11 - 51 U/L    Comment: Performed at [facility] [address], [place], [place] [address]  Lactic acid, plasma     Status: None   Collection Time: 03/28/19 12:06 PM  Result Value Ref Range   Lactic Acid, Venous 1.9 0.5 - 1.9 mmol/L    Comment: Performed at [facility] [address] [place], [place] [address]  SARS Coronavirus 2 (CEPHEID- Performed in [facility] hospital lab), Hosp Order     Status: None   Collection Time: 03/28/19 12:27 PM   Specimen: Nasopharyngeal Swab  Result Value Ref Range   SARS Coronavirus 2 NEGATIVE NEGATIVE    Comment: (NOTE) If result is NEGATIVE SARS-CoV-2 target nucleic acids are NOT DETECTED. The SARS-CoV-2 RNA is generally detectable in upper and lower  respiratory specimens during the acute phase of infection. The lowest  concentration of SARS-CoV-2 viral copies this assay can detect is 250  copies / mL. A negative result does not preclude SARS-CoV-2 infection  and should not be used as the sole basis for treatment or other  patient management decisions.  A negative result may occur with  improper specimen collection / handling, submission of specimen other  than nasopharyngeal swab, presence of viral mutation(s) within the  areas targeted by this assay, and inadequate number of viral copies  (<250 copies / mL). A negative result must be combined with clinical  observations, patient history, and epidemiological information. If result is POSITIVE SARS-CoV-2 target nucleic acids are DETECTED. The SARS-CoV-2 RNA is generally detectable in upper and lower  respiratory specimens dur ing the acute phase of infection.  Positive  results are indicative of active infection with SARS-CoV-2.  Clinical  correlation with patient history and other diagnostic information is  necessary to determine patient infection status.  Positive results do  not rule out bacterial infection or co-infection with other  viruses. If result is PRESUMPTIVE POSTIVE SARS-CoV-2 nucleic acids MAY BE PRESENT.   A presumptive positive result was obtained on the submitted specimen  and confirmed on repeat testing.  While 2019 novel coronavirus  (SARS-CoV-2) nucleic acids may be present in the submitted sample  additional confirmatory testing may be necessary for epidemiological  and / or clinical management purposes  to differentiate between  SARS-CoV-2 and other Sarbecovirus currently known to infect humans.  If clinically indicated additional testing with an alternate test  methodology [phone]([id number]) is advised. The SARS-CoV-2 RNA is generally  detectable in upper and lower respiratory sp ecimens during the acute  phase of infection. The expected result is Negative. Fact Sheet for Patients:  BoilerBrush.com.cyhttps://www.fda.gov/media/136312/download Fact Sheet for Healthcare Providers: https://pope.com/https://www.fda.gov/media/136313/download This test is not yet approved or cleared by the [place] FDA and has been authorized for detection and/or diagnosis of SARS-CoV-2 by FDA under an Emergency Use Authorization (EUA).  This EUA will remain in effect (meaning this test can be used) for the duration of the COVID-19 declaration under Section 564(b)(1) of the Act, 21 U.S.C. section 360bbb-3(b)(1), unless the authorization is terminated or revoked sooner. Performed at [facility], [address] [place], [place] [address]   Urinalysis, Routine w reflex microscopic     Status: Abnormal   Collection Time: 03/28/19  1:38 PM  Result Value Ref Range   Color, Urine YELLOW YELLOW   APPearance CLEAR CLEAR   Specific Gravity, Urine 1.012 1.005 - 1.030   pH 6.0 5.0 - 8.0   Glucose, UA NEGATIVE NEGATIVE mg/dL   Hgb urine dipstick MODERATE (A) NEGATIVE   Bilirubin Urine NEGATIVE NEGATIVE   Ketones, ur 5 (A) NEGATIVE mg/dL   Protein, ur NEGATIVE NEGATIVE mg/dL   Nitrite NEGATIVE NEGATIVE   Leukocytes,Ua NEGATIVE NEGATIVE   RBC / HPF 0-5 0  - 5 RBC/hpf   WBC, UA 0-5 0 - 5 WBC/hpf   Bacteria, UA NONE SEEN NONE SEEN   Squamous Epithelial / LPF 0-5 0 - 5    Comment: Performed at [facility], [address] [place], [place] [address]  Troponin I (High Sensitivity)     Status: Abnormal   Collection Time: 03/28/19  2:14 PM  Result Value Ref Range   Troponin I (High Sensitivity) 52 (H) <18 ng/L    Comment: (NOTE) Elevated high sensitivity troponin I (hsTnI) values and significant  changes across serial measurements may suggest ACS but many other  chronic and acute conditions are known to elevate hsTnI results.  Refer to the "Links" section for chest pain algorithms and additional  guidance. Performed at [facility], [address] [place], [place] [address]    Dg Chest Portable 1 View  Result Date: 03/28/2019 CLINICAL DATA:  Cough, shortness of breath. EXAM: PORTABLE CHEST 1 VIEW COMPARISON:  Radiographs of January 12, 2012. FINDINGS: The heart size and mediastinal contours are within normal limits. Both lungs are clear. No pneumothorax or pleural effusion is noted. The visualized skeletal structures are unremarkable. IMPRESSION: No active disease. Electronically Signed   By: Lupita RaiderJames  Green Jr M.D.   On: 03/28/2019 12:34    Review of Systems  Constitutional: Negative for chills and fever.  HENT: Negative for hearing loss.   Eyes: Negative for blurred vision.  Respiratory: Negative for cough and shortness of breath.   Cardiovascular: Positive for chest pain. Negative for orthopnea, claudication and leg swelling.  Gastrointestinal: Negative for abdominal pain, nausea and vomiting.  Genitourinary: Negative for dysuria.  Neurological: Negative for dizziness.    Blood pressure (!) 166/97, pulse 70, temperature 98.6 F (37 C), resp. rate 18, SpO2 99 %. Physical Exam  Constitutional: He is oriented to person, place, and time.  HENT:  Head: Normocephalic and atraumatic.  Eyes: Pupils are equal, round, and reactive  to light. Conjunctivae are normal. Left eye exhibits no discharge. No scleral icterus.  Neck: Normal range of motion. Neck supple. No JVD present. No tracheal deviation present. No thyromegaly present.  Cardiovascular: Normal rate and regular rhythm. Exam reveals no gallop.  Murmur (soft systolic murmur noted) heard. Respiratory: Effort normal and breath sounds normal. No respiratory distress. He has no wheezes. He has no rales.  GI: Soft. Bowel sounds are normal. He exhibits no distension. There is no abdominal tenderness. There is no rebound.  Musculoskeletal:        General: No tenderness, deformity or edema.  Neurological: He is alert and oriented to person, place, and time.     Assessment/Plan Acute coronary syndrome. Coronary artery disease with history of PCI to LAD in the remote past. Hypertension. Hyperlipidemia. Tobacco abuse. Degenerative joint disease. Hypokalemia. Elevated LFTs Plan As per orders Rinaldo CloudHarwani, Rhea Thrun, MD 03/28/2019, 4:21 PM

## 2019-03-28 NOTE — ED Notes (Signed)
Attempted report at this time.  Nurse to call back when available. 

## 2019-03-28 NOTE — Progress Notes (Signed)
Pt refused to take Morphine IV now. Pt stated " the pain is better now, I'll take Tylenol instead of Morphine." Will continue to monitor pt.Marland Kitchen

## 2019-03-28 NOTE — Progress Notes (Signed)
Pt complains of chest pressure pain with pain scale 6/10 radiating to the L arm and L scapular area. 12L EKG done. Nitroglycerin SL given x 3. Pain went down to 4 from 6. VS stable. Dr. Doylene Canard notified and ordered Morphine 2mg  IV once. Will continue to monitor pt.

## 2019-03-29 ENCOUNTER — Observation Stay (HOSPITAL_COMMUNITY): Payer: Medicare Other

## 2019-03-29 DIAGNOSIS — I420 Dilated cardiomyopathy: Secondary | ICD-10-CM | POA: Diagnosis not present

## 2019-03-29 DIAGNOSIS — D573 Sickle-cell trait: Secondary | ICD-10-CM | POA: Diagnosis not present

## 2019-03-29 DIAGNOSIS — I25118 Atherosclerotic heart disease of native coronary artery with other forms of angina pectoris: Secondary | ICD-10-CM | POA: Diagnosis not present

## 2019-03-29 DIAGNOSIS — Z1159 Encounter for screening for other viral diseases: Secondary | ICD-10-CM | POA: Diagnosis not present

## 2019-03-29 DIAGNOSIS — Z87891 Personal history of nicotine dependence: Secondary | ICD-10-CM | POA: Diagnosis not present

## 2019-03-29 DIAGNOSIS — Z7952 Long term (current) use of systemic steroids: Secondary | ICD-10-CM | POA: Diagnosis not present

## 2019-03-29 DIAGNOSIS — J40 Bronchitis, not specified as acute or chronic: Secondary | ICD-10-CM | POA: Diagnosis not present

## 2019-03-29 DIAGNOSIS — E876 Hypokalemia: Secondary | ICD-10-CM | POA: Diagnosis not present

## 2019-03-29 DIAGNOSIS — R7989 Other specified abnormal findings of blood chemistry: Secondary | ICD-10-CM | POA: Diagnosis present

## 2019-03-29 DIAGNOSIS — M1990 Unspecified osteoarthritis, unspecified site: Secondary | ICD-10-CM | POA: Diagnosis not present

## 2019-03-29 DIAGNOSIS — E785 Hyperlipidemia, unspecified: Secondary | ICD-10-CM | POA: Diagnosis not present

## 2019-03-29 DIAGNOSIS — Z9861 Coronary angioplasty status: Secondary | ICD-10-CM | POA: Diagnosis not present

## 2019-03-29 DIAGNOSIS — I10 Essential (primary) hypertension: Secondary | ICD-10-CM | POA: Diagnosis not present

## 2019-03-29 DIAGNOSIS — Z7902 Long term (current) use of antithrombotics/antiplatelets: Secondary | ICD-10-CM | POA: Diagnosis not present

## 2019-03-29 DIAGNOSIS — Z7982 Long term (current) use of aspirin: Secondary | ICD-10-CM | POA: Diagnosis not present

## 2019-03-29 LAB — CBC
HCT: 35.9 % — ABNORMAL LOW (ref 39.0–52.0)
Hemoglobin: 12.1 g/dL — ABNORMAL LOW (ref 13.0–17.0)
MCH: 28.9 pg (ref 26.0–34.0)
MCHC: 33.7 g/dL (ref 30.0–36.0)
MCV: 85.9 fL (ref 80.0–100.0)
Platelets: 187 10*3/uL (ref 150–400)
RBC: 4.18 MIL/uL — ABNORMAL LOW (ref 4.22–5.81)
RDW: 12.2 % (ref 11.5–15.5)
WBC: 8.2 10*3/uL (ref 4.0–10.5)
nRBC: 0 % (ref 0.0–0.2)

## 2019-03-29 LAB — BASIC METABOLIC PANEL
Anion gap: 7 (ref 5–15)
BUN: 8 mg/dL (ref 8–23)
CO2: 25 mmol/L (ref 22–32)
Calcium: 8.4 mg/dL — ABNORMAL LOW (ref 8.9–10.3)
Chloride: 106 mmol/L (ref 98–111)
Creatinine, Ser: 0.77 mg/dL (ref 0.61–1.24)
GFR calc Af Amer: >60 mL/min (ref >60)
GFR calc non Af Amer: >60 mL/min (ref >60)
Glucose, Bld: 101 mg/dL — ABNORMAL HIGH (ref 70–99)
Potassium: 3.6 mmol/L (ref 3.5–5.1)
Sodium: 138 mmol/L (ref 135–145)

## 2019-03-29 LAB — URINE CULTURE: Culture: NO GROWTH

## 2019-03-29 LAB — LIPID PANEL
Cholesterol: 98 mg/dL (ref 0–200)
HDL: 62 mg/dL (ref >40)
LDL Cholesterol: 11 mg/dL (ref 0–99)
Total CHOL/HDL Ratio: 1.6 RATIO
Triglycerides: 126 mg/dL (ref <150)
VLDL: 25 mg/dL (ref 0–40)

## 2019-03-29 LAB — HEPARIN LEVEL (UNFRACTIONATED)
Heparin Unfractionated: 0.24 IU/mL — ABNORMAL LOW (ref 0.30–0.70)
Heparin Unfractionated: 0.25 IU/mL — ABNORMAL LOW (ref 0.30–0.70)
Heparin Unfractionated: 0.28 IU/mL — ABNORMAL LOW (ref 0.30–0.70)
Heparin Unfractionated: 0.54 IU/mL (ref 0.30–0.70)

## 2019-03-29 LAB — TROPONIN I (HIGH SENSITIVITY): Troponin I (High Sensitivity): 53 ng/L — ABNORMAL HIGH (ref <18)

## 2019-03-29 LAB — HIV ANTIBODY (ROUTINE TESTING W REFLEX): HIV Screen 4th Generation wRfx: NONREACTIVE

## 2019-03-29 MED ORDER — HEPARIN BOLUS VIA INFUSION
1050.0000 [IU] | Freq: Once | INTRAVENOUS | Status: AC
  Administered 2019-03-29: 16:00:00 1050 [IU] via INTRAVENOUS
  Filled 2019-03-29: qty 1050

## 2019-03-29 MED ORDER — REGADENOSON 0.4 MG/5ML IV SOLN
0.4000 mg | Freq: Once | INTRAVENOUS | Status: DC
  Filled 2019-03-29: qty 5

## 2019-03-29 MED ORDER — TECHNETIUM TC 99M TETROFOSMIN IV KIT
10.0000 | PACK | Freq: Once | INTRAVENOUS | Status: AC | PRN
  Administered 2019-03-29: 11:00:00 10 via INTRAVENOUS

## 2019-03-29 MED ORDER — REGADENOSON 0.4 MG/5ML IV SOLN
INTRAVENOUS | Status: AC
  Administered 2019-03-29: 0.4 mg
  Filled 2019-03-29: qty 5

## 2019-03-29 MED ORDER — TECHNETIUM TC 99M TETROFOSMIN IV KIT
30.0000 | PACK | Freq: Once | INTRAVENOUS | Status: AC | PRN
  Administered 2019-03-29: 13:00:00 30 via INTRAVENOUS

## 2019-03-29 NOTE — Progress Notes (Signed)
[place] for heparin Indication: chest pain/ACS  Allergies  Allergen Reactions  . Other     crustaceans?    Patient Measurements: Height: 5\' 11"  (180.3 cm) Weight: 155 lb 3.3 oz (70.4 kg) IBW/kg (Calculated) : 75.3 Heparin Dosing Weight: 66.2kg  Vital Signs: Temp: 98.2 F (36.8 C) (07/11 2033) Temp Source: Oral (07/11 2033) BP: 123/70 (07/11 2135) Pulse Rate: 76 (07/11 2135)  Labs: Recent Labs    03/28/19 1206 03/28/19 1414  03/29/19 0349 03/29/19 0505 03/29/19 0713 03/29/19 1438 03/29/19 2121  HGB 13.4  --   --  12.1*  --   --   --   --   HCT 40.6  --   --  35.9*  --   --   --   --   PLT 217  --   --  187  --   --   --   --   HEPARINUNFRC  --   --    < >  --   --  0.28* 0.25* 0.54  CREATININE 0.82  --   --  0.77  --   --   --   --   TROPONINIHS 46* 52*  --   --  53*  --   --   --    < > = values in this interval not displayed.    Estimated Creatinine Clearance: 91.7 mL/min (by C-G formula based on SCr of 0.77 mg/dL).   Medical History: Past Medical History:  Diagnosis Date  . Arthritis   . Cataracts, bilateral   . Hyperlipidemia   . Hypertension   . Seasonal allergies   . Sickle cell trait (HCC)     Medications:  Infusions:  . sodium chloride 50 mL/hr at 03/29/19 1546  . heparin 1,200 Units/hr (03/29/19 1542)    Assessment: 61 yom presented to the ED with CP. Troponin elevated but with known history of CAD.  -heparin level at goal   Goal of Therapy:  Heparin level 0.3-0.7 units/ml Monitor platelets by anticoagulation protocol: Yes   Plan: -No heparin changes needed -Daily CBC and heparin level  Hildred Laser, PharmD Clinical Pharmacist **Pharmacist phone directory can now be found on [email or web address] ([email or web address]).  Listed under [place].

## 2019-03-29 NOTE — Progress Notes (Addendum)
[place] for heparin Indication: chest pain/ACS  Allergies  Allergen Reactions  . Other     crustaceans?    Patient Measurements: Height: 5\' 11"  (180.3 cm) Weight: 155 lb 3.3 oz (70.4 kg) IBW/kg (Calculated) : 75.3 Heparin Dosing Weight: 66.2kg  Vital Signs: Temp: 98 F (36.7 C) (07/11 0635) Temp Source: Oral (07/11 0635) BP: 154/83 (07/11 0759) Pulse Rate: 66 (07/11 0759)  Labs: Recent Labs    03/28/19 1206 03/28/19 1414 03/29/19 0008 03/29/19 0349 03/29/19 0505 03/29/19 0713  HGB 13.4  --   --  12.1*  --   --   HCT 40.6  --   --  35.9*  --   --   PLT 217  --   --  187  --   --   HEPARINUNFRC  --   --  0.24*  --   --  0.28*  CREATININE 0.82  --   --  0.77  --   --   TROPONINIHS 46* 52*  --   --  53*  --     Estimated Creatinine Clearance: 91.7 mL/min (by C-G formula based on SCr of 0.77 mg/dL).   Medical History: Past Medical History:  Diagnosis Date  . Arthritis   . Cataracts, bilateral   . Hyperlipidemia   . Hypertension   . Seasonal allergies   . Sickle cell trait (HCC)     Medications:  Infusions:  . sodium chloride 50 mL/hr at 03/28/19 1812  . heparin 950 Units/hr (03/29/19 0046)    Assessment: 19 yom presented to the ED with CP. Troponin elevated but with known history of CAD. Heparin level subtherapeutic (0.28). No bleeding per RN. Hgb 13.4>>12.1, Plt WNL.   Goal of Therapy:  Heparin level 0.3-0.7 units/ml Monitor platelets by anticoagulation protocol: Yes   Plan:  - Increase heparin by 2 units/kg/hr to 1100 units/hr - Check a 6 hr heparin level - Monitor daily CBC and s/sx bleeding  ADDENDUM 03/29/19 15:18 Heparin level came back 0.25 (subtherapeutic); will IV bolus heparin 1050 units x1 and increase rate to 1200 units/hr. Will recheck heparin level in 6 hrs.   Agnes Lawrence, PharmD PGY1 Pharmacy Resident

## 2019-03-29 NOTE — Progress Notes (Signed)
ANTICOAGULATION CONSULT NOTE - Follow Up Consult  Pharmacy Consult for heparin Indication: chest pain/ACS  Labs: Recent Labs    03/28/19 1206 03/28/19 1414 03/29/19 0008  HGB 13.4  --   --   HCT 40.6  --   --   PLT 217  --   --   HEPARINUNFRC  --   --  0.24*  CREATININE 0.82  --   --   TROPONINIHS 46* 52*  --     Assessment: 67yo male subtherapeutic on heparin with initial dosing for CP; no gtt issues or signs of bleeding per RN.  Goal of Therapy:  Heparin level 0.3-0.7 units/ml   Plan:  Will increase heparin gtt by 2 units/kg/hr to 950 units/hr and check level in 6 hours.    Wynona Neat, PharmD, BCPS  03/29/2019,12:47 AM

## 2019-03-30 ENCOUNTER — Encounter (HOSPITAL_COMMUNITY): Payer: Self-pay | Admitting: *Deleted

## 2019-03-30 ENCOUNTER — Inpatient Hospital Stay (HOSPITAL_COMMUNITY): Payer: Medicare Other

## 2019-03-30 DIAGNOSIS — I420 Dilated cardiomyopathy: Secondary | ICD-10-CM | POA: Diagnosis not present

## 2019-03-30 DIAGNOSIS — R7989 Other specified abnormal findings of blood chemistry: Secondary | ICD-10-CM | POA: Diagnosis not present

## 2019-03-30 DIAGNOSIS — Z1159 Encounter for screening for other viral diseases: Secondary | ICD-10-CM | POA: Diagnosis not present

## 2019-03-30 DIAGNOSIS — I25118 Atherosclerotic heart disease of native coronary artery with other forms of angina pectoris: Secondary | ICD-10-CM | POA: Diagnosis not present

## 2019-03-30 DIAGNOSIS — I10 Essential (primary) hypertension: Secondary | ICD-10-CM | POA: Diagnosis not present

## 2019-03-30 LAB — CBC
HCT: 33.7 % — ABNORMAL LOW (ref 39.0–52.0)
Hemoglobin: 11.3 g/dL — ABNORMAL LOW (ref 13.0–17.0)
MCH: 29.3 pg (ref 26.0–34.0)
MCHC: 33.5 g/dL (ref 30.0–36.0)
MCV: 87.3 fL (ref 80.0–100.0)
Platelets: 159 10*3/uL (ref 150–400)
RBC: 3.86 MIL/uL — ABNORMAL LOW (ref 4.22–5.81)
RDW: 12.2 % (ref 11.5–15.5)
WBC: 6.4 10*3/uL (ref 4.0–10.5)
nRBC: 0 % (ref 0.0–0.2)

## 2019-03-30 LAB — ECHOCARDIOGRAM LIMITED
Height: 71 in
Weight: 2358.4 oz

## 2019-03-30 LAB — HEPARIN LEVEL (UNFRACTIONATED): Heparin Unfractionated: 0.67 IU/mL (ref 0.30–0.70)

## 2019-03-30 MED ORDER — SODIUM CHLORIDE 0.9 % IV SOLN
500.0000 mg | INTRAVENOUS | Status: DC
  Administered 2019-03-30: 500 mg via INTRAVENOUS
  Filled 2019-03-30 (×2): qty 500

## 2019-03-30 MED ORDER — ENSURE ENLIVE PO LIQD
237.0000 mL | Freq: Two times a day (BID) | ORAL | Status: DC
  Administered 2019-03-30 – 2019-03-31 (×2): 237 mL via ORAL

## 2019-03-30 MED ORDER — ALBUTEROL SULFATE (2.5 MG/3ML) 0.083% IN NEBU
2.5000 mg | INHALATION_SOLUTION | Freq: Four times a day (QID) | RESPIRATORY_TRACT | Status: DC
  Administered 2019-03-30 – 2019-03-31 (×4): 2.5 mg via RESPIRATORY_TRACT
  Filled 2019-03-30 (×4): qty 3

## 2019-03-30 NOTE — Progress Notes (Signed)
Ref: Harvest ForestBakare, Mobolaji B, MD   Subjective:  Cough and chest pain continues.  NM myocardial perfusion with large inferior wall MI and no significant reversible ischemia.  Objective:  Vital Signs in the last 24 hours: Temp:  [98.2 F (36.8 C)-98.4 F (36.9 C)] 98.2 F (36.8 C) (07/12 0616) Pulse Rate:  [59-122] 72 (07/12 0616) Cardiac Rhythm: Normal sinus rhythm (07/12 0700) Resp:  [14-19] 15 (07/12 0616) BP: (120-187)/(67-98) 168/84 (07/12 0616) SpO2:  [99 %-100 %] 100 % (07/12 0616) Weight:  [66.9 kg] 66.9 kg (07/12 0500)  Physical Exam: BP Readings from Last 1 Encounters:  03/30/19 (!) 168/84     Wt Readings from Last 1 Encounters:  03/30/19 66.9 kg    Weight change: 0.635 kg Body mass index is 20.56 kg/m. HEENT: [place]/AT, Eyes-Brown, PERL, EOMI, Conjunctiva-Pink, Sclera-Non-icteric Neck: No JVD, No bruit, Trachea midline. Lungs:  Rhonchi, Bilateral. Cardiac:  Regular rhythm, normal S1 and S2, no S3. II/VI systolic murmur. Abdomen:  Soft, non-tender. BS present. Extremities:  No edema present. No cyanosis. No clubbing. CNS: AxOx3, Cranial nerves grossly intact, moves all 4 extremities.  Skin: Warm and dry.   Intake/Output from previous day: 07/11 0701 - 07/12 0700 In: 2023.9 [P.O.:1070; I.V.:953.9] Out: 2200 [Urine:2200]    Lab Results: BMET    Component Value Date/Time   NA 138 03/29/2019 0349   NA 139 03/28/2019 1206   NA 142 01/12/2012 1904   K 3.6 03/29/2019 0349   K 3.2 (L) 03/28/2019 1206   K 3.9 01/12/2012 1904   CL 106 03/29/2019 0349   CL 104 03/28/2019 1206   CL 105 01/12/2012 1904   CO2 25 03/29/2019 0349   CO2 23 03/28/2019 1206   CO2 26 01/12/2012 1833   GLUCOSE 101 (H) 03/29/2019 0349   GLUCOSE 101 (H) 03/28/2019 1206   GLUCOSE 91 01/12/2012 1904   BUN 8 03/29/2019 0349   BUN 8 03/28/2019 1206   BUN 12 01/12/2012 1904   CREATININE 0.77 03/29/2019 0349   CREATININE 0.82 03/28/2019 1206   CREATININE 1.00 01/12/2012 1904   CALCIUM 8.4 (L)  03/29/2019 0349   CALCIUM 9.1 03/28/2019 1206   CALCIUM 9.4 01/12/2012 1833   GFRNONAA >60 03/29/2019 0349   GFRNONAA >60 03/28/2019 1206   GFRNONAA 89 (L) 01/12/2012 1833   GFRAA >60 03/29/2019 0349   GFRAA >60 03/28/2019 1206   GFRAA >90 01/12/2012 1833   CBC    Component Value Date/Time   WBC 6.4 03/30/2019 0404   RBC 3.86 (L) 03/30/2019 0404   HGB 11.3 (L) 03/30/2019 0404   HCT 33.7 (L) 03/30/2019 0404   PLT 159 03/30/2019 0404   MCV 87.3 03/30/2019 0404   MCH 29.3 03/30/2019 0404   MCHC 33.5 03/30/2019 0404   RDW 12.2 03/30/2019 0404   LYMPHSABS 1.3 03/28/2019 1206   MONOABS 0.6 03/28/2019 1206   EOSABS 0.1 03/28/2019 1206   BASOSABS 0.1 03/28/2019 1206   HEPATIC Function Panel Recent Labs    03/28/19 1206  PROT 7.2   HEMOGLOBIN A1C No components found for: HGA1C,  MPG CARDIAC ENZYMES Lab Results  Component Value Date   CKTOTAL 67 06/08/2009   CKMB 1.6 06/08/2009   TROPONINI 0.03        NO INDICATION OF MYOCARDIAL INJURY. 06/08/2009   TROPONINI 0.03        NO INDICATION OF MYOCARDIAL INJURY. 06/08/2009   TROPONINI 0.02        NO INDICATION OF MYOCARDIAL INJURY. 06/07/2009   BNP  No results for input(s): PROBNP in the last 8760 hours. TSH No results for input(s): TSH in the last 8760 hours. CHOLESTEROL Recent Labs    03/29/19 0349  CHOL 98    Scheduled Meds: . aspirin EC  81 mg Oral Daily  . atorvastatin  40 mg Oral q1800  . clopidogrel  75 mg Oral Daily  . feeding supplement (ENSURE ENLIVE)  237 mL Oral BID BM  . iron polysaccharides  150 mg Oral Daily  . lisinopril  5 mg Oral Daily  . metoprolol tartrate  25 mg Oral BID  .  morphine injection  2 mg Intravenous Once  . nitroGLYCERIN  0.5 inch Topical Q6H  . pantoprazole  40 mg Oral Q0600  . regadenoson  0.4 mg Intravenous Once  . tamsulosin  0.4 mg Oral Daily  . vitamin C  500 mg Oral Daily   Continuous Infusions: . sodium chloride 50 mL/hr at 03/29/19 1546   PRN Meds:.acetaminophen,  guaiFENesin-dextromethorphan, nitroGLYCERIN, ondansetron (ZOFRAN) IV  Assessment/Plan: Acute coronary syndrome CAD with PTCA LAD HTN Hyperlipidemia Tobacco use disorder Hypokalemia, resolved Dilated cardiomyopathy  Echocardiogram for LV and RV systolic function.   LOS: 1 day   Time spent including chart review, lab review, examination, discussion with patient : 30 min   Dixie Dials  MD  03/30/2019, 10:34 AM

## 2019-03-30 NOTE — Progress Notes (Signed)
[place] for heparin Indication: chest pain/ACS  Allergies  Allergen Reactions  . Other     crustaceans?    Patient Measurements: Height: 5\' 11"  (180.3 cm) Weight: 147 lb 6.4 oz (66.9 kg) IBW/kg (Calculated) : 75.3 Heparin Dosing Weight: 66.2kg  Vital Signs: Temp: 98.2 F (36.8 C) (07/12 0616) Temp Source: Oral (07/12 0616) BP: 168/84 (07/12 0616) Pulse Rate: 72 (07/12 0616)  Labs: Recent Labs    03/28/19 1206 03/28/19 1414  03/29/19 0349 03/29/19 0505  03/29/19 1438 03/29/19 2121 03/30/19 0404  HGB 13.4  --   --  12.1*  --   --   --   --  11.3*  HCT 40.6  --   --  35.9*  --   --   --   --  33.7*  PLT 217  --   --  187  --   --   --   --  159  HEPARINUNFRC  --   --    < >  --   --    < > 0.25* 0.54 0.67  CREATININE 0.82  --   --  0.77  --   --   --   --   --   TROPONINIHS 46* 52*  --   --  53*  --   --   --   --    < > = values in this interval not displayed.    Estimated Creatinine Clearance: 87.1 mL/min (by C-G formula based on SCr of 0.77 mg/dL).   Medical History: Past Medical History:  Diagnosis Date  . Arthritis   . Cataracts, bilateral   . Hyperlipidemia   . Hypertension   . Seasonal allergies   . Sickle cell trait (HCC)     Medications:  Infusions:  . sodium chloride 50 mL/hr at 03/29/19 1546  . heparin 1,200 Units/hr (03/29/19 1542)    Assessment: 36 yom presented to the ED with CP. Troponin elevated but with known history of CAD. Heparin level 0.67 (upper end of goal). No bleeding per RN. Hgb 13.4>>12.1>>11.3, Plt WNL.   Goal of Therapy:  Heparin level 0.3-0.7 units/ml Monitor platelets by anticoagulation protocol: Yes   Plan:  - Decrease heparin to 1150 units/hr - Check a 6 hr heparin level - Monitor daily CBC and s/sx bleeding  Agnes Lawrence, PharmD PGY1 Pharmacy Resident

## 2019-03-30 NOTE — Progress Notes (Signed)
Ref: Audley Hose, MD   Subjective:  Awaiting  Stress test. Recurrent chest pain. Troponin I flat at 46 to 53 ng.  Objective:  Vital Signs in the last 24 hours: Temp:  [98.2 F (36.8 C)-98.4 F (36.9 C)] 98.2 F (36.8 C) (07/12 0616) Pulse Rate:  [59-122] 72 (07/12 0616) Cardiac Rhythm: Normal sinus rhythm (07/12 0700) Resp:  [14-19] 15 (07/12 0616) BP: (120-187)/(67-98) 168/84 (07/12 0616) SpO2:  [99 %-100 %] 100 % (07/12 0616) Weight:  [66.9 kg] 66.9 kg (07/12 0500)  Physical Exam: BP Readings from Last 1 Encounters:  03/30/19 (!) 168/84     Wt Readings from Last 1 Encounters:  03/30/19 66.9 kg    Weight change: 0.635 kg Body mass index is 20.56 kg/m. HEENT: [place]/AT, Eyes-Brown, PERL, EOMI, Conjunctiva-Pink, Sclera-Non-icteric Neck: No JVD, No bruit, Trachea midline. Lungs:  Rhonchio with cough, Bilateral. Cardiac:  Regular rhythm, normal S1 and S2, no S3. II/VI systolic murmur. Abdomen:  Soft, non-tender. BS present. Extremities:  No edema present. No cyanosis. No clubbing. CNS: AxOx3, Cranial nerves grossly intact, moves all 4 extremities.  Skin: Warm and dry.   Intake/Output from previous day: 07/11 0701 - 07/12 0700 In: 2023.9 [P.O.:1070; I.V.:953.9] Out: 2200 [Urine:2200]    Lab Results: BMET    Component Value Date/Time   NA 138 03/29/2019 0349   NA 139 03/28/2019 1206   NA 142 01/12/2012 1904   K 3.6 03/29/2019 0349   K 3.2 (L) 03/28/2019 1206   K 3.9 01/12/2012 1904   CL 106 03/29/2019 0349   CL 104 03/28/2019 1206   CL 105 01/12/2012 1904   CO2 25 03/29/2019 0349   CO2 23 03/28/2019 1206   CO2 26 01/12/2012 1833   GLUCOSE 101 (H) 03/29/2019 0349   GLUCOSE 101 (H) 03/28/2019 1206   GLUCOSE 91 01/12/2012 1904   BUN 8 03/29/2019 0349   BUN 8 03/28/2019 1206   BUN 12 01/12/2012 1904   CREATININE 0.77 03/29/2019 0349   CREATININE 0.82 03/28/2019 1206   CREATININE 1.00 01/12/2012 1904   CALCIUM 8.4 (L) 03/29/2019 0349   CALCIUM 9.1  03/28/2019 1206   CALCIUM 9.4 01/12/2012 1833   GFRNONAA >60 03/29/2019 0349   GFRNONAA >60 03/28/2019 1206   GFRNONAA 89 (L) 01/12/2012 1833   GFRAA >60 03/29/2019 0349   GFRAA >60 03/28/2019 1206   GFRAA >90 01/12/2012 1833   CBC    Component Value Date/Time   WBC 6.4 03/30/2019 0404   RBC 3.86 (L) 03/30/2019 0404   HGB 11.3 (L) 03/30/2019 0404   HCT 33.7 (L) 03/30/2019 0404   PLT 159 03/30/2019 0404   MCV 87.3 03/30/2019 0404   MCH 29.3 03/30/2019 0404   MCHC 33.5 03/30/2019 0404   RDW 12.2 03/30/2019 0404   LYMPHSABS 1.3 03/28/2019 1206   MONOABS 0.6 03/28/2019 1206   EOSABS 0.1 03/28/2019 1206   BASOSABS 0.1 03/28/2019 1206   HEPATIC Function Panel Recent Labs    03/28/19 1206  PROT 7.2   HEMOGLOBIN A1C No components found for: HGA1C,  MPG CARDIAC ENZYMES Lab Results  Component Value Date   CKTOTAL 67 06/08/2009   CKMB 1.6 06/08/2009   TROPONINI 0.03        NO INDICATION OF MYOCARDIAL INJURY. 06/08/2009   TROPONINI 0.03        NO INDICATION OF MYOCARDIAL INJURY. 06/08/2009   TROPONINI 0.02        NO INDICATION OF MYOCARDIAL INJURY. 06/07/2009   BNP No results  for input(s): PROBNP in the last 8760 hours. TSH No results for input(s): TSH in the last 8760 hours. CHOLESTEROL Recent Labs    03/29/19 0349  CHOL 98    Scheduled Meds: . aspirin EC  81 mg Oral Daily  . atorvastatin  40 mg Oral q1800  . clopidogrel  75 mg Oral Daily  . feeding supplement (ENSURE ENLIVE)  237 mL Oral BID BM  . iron polysaccharides  150 mg Oral Daily  . lisinopril  5 mg Oral Daily  . metoprolol tartrate  25 mg Oral BID  .  morphine injection  2 mg Intravenous Once  . nitroGLYCERIN  0.5 inch Topical Q6H  . pantoprazole  40 mg Oral Q0600  . regadenoson  0.4 mg Intravenous Once  . tamsulosin  0.4 mg Oral Daily  . vitamin C  500 mg Oral Daily   Continuous Infusions: . sodium chloride 50 mL/hr at 03/29/19 1546   PRN Meds:.acetaminophen, guaiFENesin-dextromethorphan,  nitroGLYCERIN, ondansetron (ZOFRAN) IV  Assessment/Plan: Acute coronary syndrome CAD with PTCA to LAD HTN Hyperlipidemia Tobacco use disorder Hypokalemia, resolved  Nuclear stress test today.   LOS: 1 day   Time spent including chart review, lab review, examination, discussion with patient : 30 min   Orpah CobbAjay Garrie Elenes  MD  03/30/2019, 10:30 AM

## 2019-03-30 NOTE — Progress Notes (Signed)
Echocardiogram shows mild LVH, normal LV systolic function, EF 60 %, calcific AV without stenosis, Mild MR and trivial TR.  Dixie Dials, MD. 03/30/2019, 6:57 PM.

## 2019-03-31 DIAGNOSIS — I25118 Atherosclerotic heart disease of native coronary artery with other forms of angina pectoris: Secondary | ICD-10-CM | POA: Diagnosis not present

## 2019-03-31 DIAGNOSIS — I10 Essential (primary) hypertension: Secondary | ICD-10-CM | POA: Diagnosis not present

## 2019-03-31 DIAGNOSIS — R7989 Other specified abnormal findings of blood chemistry: Secondary | ICD-10-CM | POA: Diagnosis not present

## 2019-03-31 DIAGNOSIS — I420 Dilated cardiomyopathy: Secondary | ICD-10-CM | POA: Diagnosis not present

## 2019-03-31 DIAGNOSIS — Z1159 Encounter for screening for other viral diseases: Secondary | ICD-10-CM | POA: Diagnosis not present

## 2019-03-31 MED ORDER — ACETAMINOPHEN 325 MG PO TABS: 650.0000 mg | ORAL_TABLET | ORAL | 1 refills | Status: DC | PRN

## 2019-03-31 MED ORDER — AZITHROMYCIN 250 MG PO TABS: ORAL_TABLET | ORAL | 0 refills | Status: DC

## 2019-03-31 NOTE — Progress Notes (Signed)
Pt complain of CP 7/10. BP 147/86, HR 68. Sublingual Nitro given x 2. Dr Terrence Dupont paged and made aware of patient's symptoms. Pt states CP has improved and now at 2/10. BP 139/77, HR 71. Resting comfortably in bed with 2L [place]. EKG being performed.

## 2019-03-31 NOTE — Discharge Instructions (Signed)
Angina ° °Angina is extreme discomfort in the chest, neck, arm, jaw, or back. The discomfort is caused by a lack of blood in the middle layer of the heart wall (myocardium). °There are four types of angina: °· Stable angina. This is triggered by vigorous activity or exercise. It goes away when you rest or take angina medicine. °· Unstable angina. This is a warning sign and can lead to a heart attack (acute coronary syndrome). This is a medical emergency. Symptoms come at rest and last a long time. °· Microvascular angina. This affects the small coronary arteries. Symptoms include feeling tired and being short of breath. °· Prinzmetal or variant angina. This is caused by a tightening (spasm) of the arteries that go to your heart. °What are the causes? °This condition is caused by atherosclerosis. This is the buildup of fat and cholesterol (plaque) in your arteries. The plaque may narrow or block the artery. °Other causes of angina include: °· Sudden tightening of the muscles of the arteries in the heart (coronary spasm). °· Small artery disease (microvascular dysfunction). °· Problems with any of your heart valves (heart valve disease). °· A tear in an artery in your heart (coronary artery dissection). °· Diseases of the heart muscle (cardiomyopathy), or other heart diseases. °What increases the risk? °You are more likely to develop this condition if you have: °· High cholesterol. °· High blood pressure (hypertension). °· Diabetes. °· A family history of heart disease. °· An inactive (sedentary) lifestyle, or you do not exercise enough. °· Depression. °· Had radiation treatment to the left side of your chest. °Other risk factors include: °· Using tobacco. °· Being obese. °· Eating a diet high in saturated fats. °· Being exposed to high stress or triggers of stress. °· Using drugs, such as cocaine. °Women have a greater risk for angina if: °· They are older than 55. °· They have gone through menopause (are  postmenopausal). °What are the signs or symptoms? °Common symptoms of this condition in both men and women may include: °· Chest pain, which may: °? Feel like a crushing or squeezing in the chest, or like a tightness, pressure, fullness, or heaviness in the chest. °? Last for more than a few minutes at a time, or it may stop and come back (recur) over the course of a few minutes. °· Pain in the neck, arm, jaw, or back. °· Unexplained heartburn or indigestion. °· Shortness of breath. °· Nausea. °· Sudden cold sweats. °Women and people with diabetes may have unusual (atypical) symptoms, such as: °· Fatigue. °· Unexplained feelings of nervousness or anxiety. °· Unexplained weakness. °· Dizziness or fainting. °How is this diagnosed? °This condition may be diagnosed based on: °· Your symptoms and medical history. °· Electrocardiogram (ECG) to measure the electrical activity in your heart. °· Blood tests. °· Stress test to look for signs of blockage when your heart is stressed. °· CT angiogram to examine your heart and the blood flow to it. °· Coronary angiogram to check your coronary arteries for blockage. °How is this treated? °Angina may be treated with: °· Medicines to: °? Prevent blood clots and heart attack. °? Relax blood vessels and improve blood flow to the heart (nitrates). °? Reduce blood pressure, improve the pumping action of the heart, and relax blood vessels that are spasming. °? Reduce cholesterol and help treat atherosclerosis. °· A procedure to widen a narrowed or blocked coronary artery (angioplasty). A mesh tube may be placed in a coronary artery to   keep it open (coronary stenting). °· Surgery to allow blood to go around a blocked artery (coronary artery bypass surgery). °Follow these instructions at home: °Medicines °· Take over-the-counter and prescription medicines only as told by your health care provider. °· Do not take the following medicines unless your health care provider approves: °? NSAIDs,  such as ibuprofen or naproxen. °? Vitamin supplements that contain vitamin A, vitamin E, or both. °? Hormone replacement therapy that contains estrogen with or without progestin. °Eating and drinking ° °· Eat a heart-healthy diet. This includes plenty of fresh fruits and vegetables, whole grains, low-fat (lean) protein, and low-fat dairy products. °· Follow instructions from your health care provider about eating or drinking restrictions. °Activity °· Follow an exercise program approved by your health care provider. °· Consider joining a cardiac rehabilitation program. °· Take a break when you feel fatigued. Plan rest periods in your daily activities. °Lifestyle ° °· Do not use any products that contain nicotine or tobacco, such as cigarettes, e-cigarettes, and chewing tobacco. If you need help quitting, ask your health care provider. °· If your health care provider says you can drink alcohol: °? Limit how much you use to: °§ 0-1 drink a day for nonpregnant women. °§ 0-2 drinks a day for men. °? Be aware of how much alcohol is in your drink. In the [place] one drink equals one 12 oz bottle of beer (355 mL), one 5 oz glass of wine (148 mL), or one 1½ oz glass of hard liquor (44 mL). °General instructions °· Maintain a healthy weight. °· Learn to manage stress. °· Keep your vaccinations up to date. Get the flu (influenza) vaccine every year. °· Talk to your health care provider if you feel depressed. Take a depression screening test to see if you are at risk for depression. °· Work with your health care provider to manage other health conditions, such as hypertension or diabetes. °· Keep all follow-up visits as told by your health care provider. This is important. °Get help right away if: °· You have pain in your chest, neck, arm, jaw, or back, and the pain: °? Lasts more than a few minutes. °? Is recurring. °? Is not relieved by taking medicines under the tongue (sublingual nitroglycerin). °? Increases in intensity or  frequency. °· You have a lot of sweating without cause. °· You have unexplained: °? Heartburn or indigestion. °? Shortness of breath or difficulty breathing. °? Nausea or vomiting. °? Fatigue. °? Feelings of nervousness or anxiety. °? Weakness. °· You have sudden light-headedness or dizziness. °· You faint. °These symptoms may represent a serious problem that is an emergency. Do not wait to see if the symptoms will go away. Get medical help right away. Call your local emergency services (911 in the [place] Do not drive yourself to the hospital. °Summary °· Angina is extreme discomfort in the chest, neck, arm, jaw, or back that is caused by a lack of blood in the heart wall. °· There are many symptoms of angina. They include chest pain, unexplained heartburn or indigestion, sudden cold sweats, and fatigue. °· Angina may be treated with behavioral changes, medicine, or surgery. °· Symptoms of angina may represent an emergency. Get medical help right away. Call your local emergency services (911 in the [place] Do not drive yourself to the hospital. °This information is not intended to replace advice given to you by your health care provider. Make sure you discuss any questions you have with your health care provider. °  Document Released: 09/04/2005 Document Revised: 04/22/2018 Document Reviewed: 04/22/2018 °Elsevier Patient Education © 2020 [employer] ° °

## 2019-03-31 NOTE — Plan of Care (Signed)
  Problem: Education: Goal: Knowledge of General Education information will improve Description: Including pain rating scale, medication(s)/side effects and non-pharmacologic comfort measures Outcome: Progressing   Problem: Clinical Measurements: Goal: Ability to maintain clinical measurements within normal limits will improve Outcome: Progressing   Problem: Clinical Measurements: Goal: Cardiovascular complication will be avoided Outcome: Progressing   Problem: Activity: Goal: Risk for activity intolerance will decrease Outcome: Progressing   Problem: Pain Managment: Goal: General experience of comfort will improve Outcome: Progressing   Problem: Safety: Goal: Ability to remain free from injury will improve Outcome: Adequate for Discharge

## 2019-03-31 NOTE — Discharge Summary (Signed)
NAME: [name] [name] [name], [name] [name] [name]. MEDICAL RECORD ZO:[id number] ACCOUNT [id number] DATE OF BIRTH:26-Nov-1950 FACILITY: [facility] LOCATION: [facility] PHYSICIAN:Dezarae Mcclaran Jeoffrey Massed, MD  DISCHARGE SUMMARY  DATE OF DISCHARGE:  03/31/2019  ADMITTING DIAGNOSES: 1.  Acute coronary syndrome. 2.  Coronary artery disease, history of percutaneous coronary intervention to left anterior descending in remote past. 3.  Hypertension. 4.  Hyperlipidemia. 5.  Tobacco abuse. 6.  Degenerative joint disease. 7.  Hypokalemia. 8.  Elevated liver function tests.  FINAL DIAGNOSES: 1.  Stable angina, myocardial infarction ruled out.  Resolving bronchitis. 2.  Coronary artery disease, history of percutaneous coronary intervention to left anterior descending in remote past. 3.  Hypertension. 4.  Hyperlipidemia. 5.  Tobacco abuse. 6.  Degenerative joint disease.  DISCHARGE HOME MEDICATIONS:   1.  Tylenol 650 mg every 4 hours as needed for musculoskeletal pain. 2.  Azithromycin 250 mg 2 tablets today and then one tablet daily for 4 more days. 3.  Aspirin 81 mg daily. 4.  Atorvastatin 20 mg daily. 5.  Clopidogrel 75 mg daily. 6.  Ferrex 150 mg 1 capsule daily. 7.  Lisinopril 5 mg daily. 8.  Metoprolol succinate 25 mg daily. 9.  Nitrostat 0.4 mg spray every 5 minutes as directed. 10.  Flomax 0.4 mg daily. 11.  Vitamin C 500 mg daily.  The patient has been advised to stop naproxen and tramadol.  DIET:  Low salt, low cholesterol.  ACTIVITY:  As tolerated.  CONDITION AT DISCHARGE:  Stable.  FOLLOWUP:  With me in 1 week.  BRIEF HISTORY AND HOSPITAL COURSE:  The patient is [name] [name] 68 year old male with past medical history significant for coronary artery disease, status post PCI to LAD in remote past, hypertension, hyperlipidemia, tobacco abuse, osteoarthritis.  He came to the  ER complaining of retrosternal and left-sided chest pain off and on for last 3 days associated with diaphoresis.  The patient denies any PND, orthopnea,  leg swelling.  Denies palpitation, lightheadedness or syncope.  EKG done in the ED showed normal sinus rhythm with no acute ischemic changes.  High sensitivity troponin I was minimally elevated.  PHYSICAL EXAMINATION: GENERAL:  He was alert, awake, oriented x3. VITAL SIGNS:  Blood pressure was 166/97, pulse 70 he was afebrile. HEENT:  Conjunctivae are pink. NECK:  Supple, no JVD, no bruit. LUNGS:  Clear to auscultation without rhonchi or rales. CARDIOVASCULAR:  S1 S2 was normal.  There was soft systolic murmur. ABDOMEN:  Soft.  Bowel sounds are present, nontender. EXTREMITIES:  There is no clubbing, cyanosis or edema.  LABORATORY DATA:  Sodium was 132, potassium 3.2.  Repeat potassium was 3.6, BUN 8, creatinine 0.82.  Blood sugar was 101.  High sensitivity troponin I was 46, 52 and 53.  Lactic acid level was 1.9.  Hemoglobin was 13.4, hematocrit 40.6, white count of  6.0.  His SARS coronavirus 2 test was negative.    Chest x-ray showed no active disease.  The patient had 2D echo done which showed normal LV systolic function with normal wall motion.  The patient also had [name] [name] nuclear stress test which showed no evidence of pharmacological induced myocardial ischemia, although there was [name] [name] large fixed inferior  wall defect suspicious for myocardial infarction.  Probably this was due to diaphragmatic attenuation.  There was diffuse left ventricular hypokinesia, EF by nuclear stress test with EF of 18%.  [name] [name] 2D echo showed EF approximately 55-60% with normal wall  motions.  BRIEF HOSPITAL COURSE:  The patient was admitted to Step-down Unit.  MI was  ruled out by serial enzymes and EKG.  The patient had vague, sharp pleuritic left-sided chest pain off and on in the hospital, was started on Zithromax yesterday.  The patient  did not have any anginal chest pain during the hospital stay.  His multiple EKGs showed no evidence of ischemia.  Nuclear stress test also showed no evidence of ischemia.  The patient  did have [name] [name] nuclear stress test 6-7 months ago, which also showed no  evidence of ischemia.    The patient will be discharged home on above medications and will be followed up in my office.  The patient has been consulted regarding lifestyle changes and compliance with medication and followup.  AN/NUANCE D:03/31/2019 T:03/31/2019 JOB:[id number]

## 2019-03-31 NOTE — Discharge Summary (Signed)
Discharge summary dictated on 03/31/2019 dictation number is [id number]

## 2019-03-31 NOTE — Progress Notes (Signed)
Subjective:  Patient complained of pleuritic precordial localized chest pain earlier this morning without any support associated symptoms.  Denies any fever, chills, cough, sore throat.  No anginal chest pain. Nuclear stress test was negative for ischemia, although showed inferior wall scarring, probably due to diaphragmatic attenuation.  2-D echo showed normal LV systolic function and wall motion.  Patient had a nuclear stress test approximately 6 months ago.  which was also negative for ischemia.  Objective:  Vital Signs in the last 24 hours: Temp:  [97.8 F (36.6 C)-98.3 F (36.8 C)] 98.3 F (36.8 C) (07/13 0800) Pulse Rate:  [53-72] 72 (07/13 0807) Resp:  [11-19] 16 (07/13 0810) BP: (123-183)/(67-86) 139/77 (07/13 0807) SpO2:  [97 %-100 %] 100 % (07/13 0810)  Intake/Output from previous day: 07/12 0701 - 07/13 0700 In: 2316.7 [P.O.:1190; I.V.:876.7; IV Piggyback:250] Out: 4205 [Urine:4205] Intake/Output from this shift: Total I/O In: 240 [P.O.:240] Out: 400 [Urine:400]  Physical Exam: Neck: no adenopathy, no carotid bruit, no JVD and supple, symmetrical, trachea midline Lungs: clear to auscultation bilaterally Heart: regular rate and rhythm, S1, S2 normal and soft systolic murmur noted Abdomen: soft, non-tender; bowel sounds normal; no masses,  no organomegaly Extremities: extremities normal, atraumatic, no cyanosis or edema  Lab Results: Recent Labs    03/29/19 0349 03/30/19 0404  WBC 8.2 6.4  HGB 12.1* 11.3*  PLT 187 159   Recent Labs    03/28/19 1206 03/29/19 0349  NA 139 138  K 3.2* 3.6  CL 104 106  CO2 23 25  GLUCOSE 101* 101*  BUN 8 8  CREATININE 0.82 0.77   No results for input(s): TROPONINI in the last 72 hours.  Invalid input(s): CK, MB Hepatic Function Panel Recent Labs    03/28/19 1206  PROT 7.2  ALBUMIN 4.1  AST 111*  ALT 63*  ALKPHOS 97  BILITOT 1.7*   Recent Labs    03/29/19 0349  CHOL 98   No results for input(s): PROTIME in the  last 72 hours.  Imaging: Imaging results have been reviewed and Dg Chest 2 View  Result Date: 03/30/2019 CLINICAL DATA:  Productive cough for 2 weeks. EXAM: CHEST - 2 VIEW COMPARISON:  March 28, 2019 FINDINGS: Cardiomediastinal silhouette is normal. Mediastinal contours appear intact. There is no evidence of focal airspace consolidation, pleural effusion or pneumothorax. Osseous structures are without acute abnormality. Soft tissues are grossly normal. IMPRESSION: No active cardiopulmonary disease. Electronically Signed   By: Ted Mcalpineobrinka  Dimitrova M.D.   On: 03/30/2019 14:19   Nm Myocar Multi W/spect W/wall Motion / Ef  Result Date: 03/29/2019 CLINICAL DATA:  Left-sided chest pain. Coronary artery disease, hypertension, hyperlipidemia, and smoking. EXAM: MYOCARDIAL IMAGING WITH SPECT (REST AND PHARMACOLOGIC-STRESS) GATED LEFT VENTRICULAR WALL MOTION STUDY LEFT VENTRICULAR EJECTION FRACTION TECHNIQUE: Standard myocardial SPECT imaging was performed after resting intravenous injection of 10 mCi Tc-5464m tetrofosmin. Subsequently, intravenous infusion of Lexiscan was performed under the supervision of the Cardiology staff. At peak effect of the drug, 30 mCi Tc-4064m tetrofosmin was injected intravenously and standard myocardial SPECT imaging was performed. Quantitative gated imaging was also performed to evaluate left ventricular wall motion, and estimate left ventricular ejection fraction. COMPARISON:  10/09/2018 FINDINGS: Perfusion: A large area of decreased myocardial activity is seen involving the inferior wall left ventricle on both stress and rest images. This is similar in appearance to previous study. No reversible myocardial perfusion defects are seen to suggest the presence of inducible ischemia. Wall Motion: Diffuse left ventricular hypokinesis is seen.  No significant left ventricular dilatation. Left Ventricular Ejection Fraction: 18 % End diastolic volume 95 ml End systolic volume 78 ml IMPRESSION: 1. No  evidence of pharmacologic induced myocardial ischemia. Large fixed inferior wall defect again seen, suspicious for myocardial infarction given wall motion study findings. 2. Diffuse left ventricular hypokinesis. 3. Left ventricular ejection fraction 18% 4. Non invasive risk stratification*: High *2012 Appropriate Use Criteria for Coronary Revascularization Focused Update: J Am Coll Cardiol. 1610;96(0):454-098. http://content.[email or web address]?articleid=[email or web address] Electronically Signed   By: Marlaine Hind M.D.   On: 03/29/2019 13:57    Cardiac Studies:  Assessment/Plan:  Atypical chest pain. Stable angina Coronary artery disease with history of PCI to LAD in the remote past. Hypertension. Hyperlipidemia. Tobacco abuse. Degenerative joint disease. Plan Continue present management. Will DC home later today  LOS: 2 days    [name] [name] 03/31/2019, 8:54 AM

## 2019-04-18 ENCOUNTER — Other Ambulatory Visit: Payer: Self-pay | Admitting: Internal Medicine

## 2019-04-18 DIAGNOSIS — Z122 Encounter for screening for malignant neoplasm of respiratory organs: Secondary | ICD-10-CM

## 2019-04-18 DIAGNOSIS — Z136 Encounter for screening for cardiovascular disorders: Secondary | ICD-10-CM

## 2019-04-25 ENCOUNTER — Other Ambulatory Visit: Payer: Self-pay | Admitting: Internal Medicine

## 2019-04-25 ENCOUNTER — Other Ambulatory Visit: Payer: Self-pay

## 2019-04-25 DIAGNOSIS — Z122 Encounter for screening for malignant neoplasm of respiratory organs: Secondary | ICD-10-CM

## 2019-04-25 DIAGNOSIS — Z20822 Contact with and (suspected) exposure to covid-19: Secondary | ICD-10-CM

## 2019-04-25 DIAGNOSIS — Z136 Encounter for screening for cardiovascular disorders: Secondary | ICD-10-CM

## 2019-04-26 LAB — NOVEL CORONAVIRUS, NAA: SARS-CoV-2, NAA: NOT DETECTED

## 2019-04-29 ENCOUNTER — Telehealth: Payer: Self-pay | Admitting: General Practice

## 2019-04-29 NOTE — Telephone Encounter (Signed)
Negative COVID results given. Patient results "NOT Detected." Caller expressed understanding. ° °

## 2019-05-06 ENCOUNTER — Ambulatory Visit
Admission: RE | Admit: 2019-05-06 | Discharge: 2019-05-06 | Disposition: A | Discharge status: Home / Self Care | Payer: Medicare HMO | Source: Ambulatory Visit | Attending: Internal Medicine | Admitting: Internal Medicine

## 2019-05-06 DIAGNOSIS — Z136 Encounter for screening for cardiovascular disorders: Secondary | ICD-10-CM

## 2019-05-06 DIAGNOSIS — Z122 Encounter for screening for malignant neoplasm of respiratory organs: Secondary | ICD-10-CM

## 2019-05-28 ENCOUNTER — Other Ambulatory Visit: Payer: Self-pay

## 2019-05-28 ENCOUNTER — Ambulatory Visit
Admission: RE | Admit: 2019-05-28 | Discharge: 2019-05-28 | Disposition: A | Discharge status: Home / Self Care | Payer: Medicare HMO | Source: Ambulatory Visit | Attending: Internal Medicine | Admitting: Internal Medicine

## 2019-05-28 DIAGNOSIS — Z136 Encounter for screening for cardiovascular disorders: Secondary | ICD-10-CM

## 2019-05-28 DIAGNOSIS — Z122 Encounter for screening for malignant neoplasm of respiratory organs: Secondary | ICD-10-CM

## 2019-07-09 ENCOUNTER — Other Ambulatory Visit: Payer: Self-pay | Admitting: Internal Medicine

## 2019-07-09 DIAGNOSIS — R945 Abnormal results of liver function studies: Secondary | ICD-10-CM

## 2019-07-11 ENCOUNTER — Ambulatory Visit
Admission: RE | Admit: 2019-07-11 | Discharge: 2019-07-11 | Disposition: A | Discharge status: Home / Self Care | Payer: Medicare HMO | Source: Ambulatory Visit | Attending: Internal Medicine | Admitting: Internal Medicine

## 2019-07-11 DIAGNOSIS — R945 Abnormal results of liver function studies: Secondary | ICD-10-CM

## 2019-07-29 ENCOUNTER — Emergency Department (HOSPITAL_COMMUNITY): Payer: Medicare HMO

## 2019-07-29 ENCOUNTER — Other Ambulatory Visit: Payer: Self-pay

## 2019-07-29 ENCOUNTER — Emergency Department (HOSPITAL_COMMUNITY)
Admission: EM | Admit: 2019-07-29 | Discharge: 2019-07-30 | Disposition: A | Discharge status: Home / Self Care | Payer: Medicare HMO | Attending: Emergency Medicine | Admitting: Emergency Medicine

## 2019-07-29 DIAGNOSIS — Z87891 Personal history of nicotine dependence: Secondary | ICD-10-CM | POA: Insufficient documentation

## 2019-07-29 DIAGNOSIS — R0789 Other chest pain: Secondary | ICD-10-CM | POA: Diagnosis not present

## 2019-07-29 DIAGNOSIS — Z20828 Contact with and (suspected) exposure to other viral communicable diseases: Secondary | ICD-10-CM | POA: Insufficient documentation

## 2019-07-29 DIAGNOSIS — Z7982 Long term (current) use of aspirin: Secondary | ICD-10-CM | POA: Diagnosis not present

## 2019-07-29 DIAGNOSIS — Z79899 Other long term (current) drug therapy: Secondary | ICD-10-CM | POA: Insufficient documentation

## 2019-07-29 DIAGNOSIS — I10 Essential (primary) hypertension: Secondary | ICD-10-CM | POA: Diagnosis not present

## 2019-07-29 DIAGNOSIS — J069 Acute upper respiratory infection, unspecified: Secondary | ICD-10-CM | POA: Insufficient documentation

## 2019-07-29 DIAGNOSIS — R05 Cough: Secondary | ICD-10-CM | POA: Diagnosis present

## 2019-07-29 LAB — CBC WITH DIFFERENTIAL/PLATELET
Abs Immature Granulocytes: 0.02 10*3/uL (ref 0.00–0.07)
Basophils Absolute: 0.1 10*3/uL (ref 0.0–0.1)
Basophils Relative: 1 %
Eosinophils Absolute: 0.3 10*3/uL (ref 0.0–0.5)
Eosinophils Relative: 5 %
HCT: 38.3 % — ABNORMAL LOW (ref 39.0–52.0)
Hemoglobin: 12.9 g/dL — ABNORMAL LOW (ref 13.0–17.0)
Immature Granulocytes: 0 %
Lymphocytes Relative: 36 %
Lymphs Abs: 2.4 10*3/uL (ref 0.7–4.0)
MCH: 29.7 pg (ref 26.0–34.0)
MCHC: 33.7 g/dL (ref 30.0–36.0)
MCV: 88.2 fL (ref 80.0–100.0)
Monocytes Absolute: 0.7 10*3/uL (ref 0.1–1.0)
Monocytes Relative: 11 %
Neutro Abs: 3.2 10*3/uL (ref 1.7–7.7)
Neutrophils Relative %: 47 %
Platelets: 168 10*3/uL (ref 150–400)
RBC: 4.34 MIL/uL (ref 4.22–5.81)
RDW: 12.6 % (ref 11.5–15.5)
WBC: 6.6 10*3/uL (ref 4.0–10.5)
nRBC: 0 % (ref 0.0–0.2)

## 2019-07-29 LAB — COMPREHENSIVE METABOLIC PANEL
ALT: 78 U/L — ABNORMAL HIGH (ref 0–44)
AST: 101 U/L — ABNORMAL HIGH (ref 15–41)
Albumin: 3.8 g/dL (ref 3.5–5.0)
Alkaline Phosphatase: 98 U/L (ref 38–126)
Anion gap: 9 (ref 5–15)
BUN: 8 mg/dL (ref 8–23)
CO2: 24 mmol/L (ref 22–32)
Calcium: 9.1 mg/dL (ref 8.9–10.3)
Chloride: 104 mmol/L (ref 98–111)
Creatinine, Ser: 0.77 mg/dL (ref 0.61–1.24)
GFR calc Af Amer: >60 mL/min (ref >60)
GFR calc non Af Amer: >60 mL/min (ref >60)
Glucose, Bld: 83 mg/dL (ref 70–99)
Potassium: 4.2 mmol/L (ref 3.5–5.1)
Sodium: 137 mmol/L (ref 135–145)
Total Bilirubin: 0.7 mg/dL (ref 0.3–1.2)
Total Protein: 6.7 g/dL (ref 6.5–8.1)

## 2019-07-29 LAB — TROPONIN I (HIGH SENSITIVITY): Troponin I (High Sensitivity): 42 ng/L — ABNORMAL HIGH (ref <18)

## 2019-07-29 NOTE — Discharge Instructions (Addendum)
You should receive a phone call if your Covid test comes back positive.  You should quarantine for 10 days after the onset of symptoms.  He should not come out of quarantine until you have had significant improvement of your symptoms or your symptoms have resolved and you have not had fever for 3 days.  Your cardiac work-up today was reassuring.  Your chest x-ray was clear.      Person Under Monitoring Name: [name] [name]  Location: [place] [address]   Infection Prevention Recommendations for Individuals Confirmed to have, or Being Evaluated for, 2019 Novel Coronavirus (COVID-19) Infection Who Receive Care at Home  Individuals who are confirmed to have, or are being evaluated for, COVID-19 should follow the prevention steps below until a healthcare provider or local or state health department says they can return to normal activities.  Stay home except to get medical care You should restrict activities outside your home, except for getting medical care. Do not go to work, school, or public areas, and do not use public transportation or taxis.  Call ahead before visiting your doctor Before your medical appointment, call the healthcare provider and tell them that you have, or are being evaluated for, COVID-19 infection. This will help the healthcare providers office take steps to keep other people from getting infected. Ask your healthcare provider to call the local or state health department.  Monitor your symptoms Seek prompt medical attention if your illness is worsening (e.g., difficulty breathing). Before going to your medical appointment, call the healthcare provider and tell them that you have, or are being evaluated for, COVID-19 infection. Ask your healthcare provider to call the local or state health department.  Wear a facemask You should wear a facemask that covers your nose and mouth when you are in the same room with other people and when you visit  a healthcare provider. People who live with or visit you should also wear a facemask while they are in the same room with you.  Separate yourself from other people in your home As much as possible, you should stay in a different room from other people in your home. Also, you should use a separate bathroom, if available.  Avoid sharing household items You should not share dishes, drinking glasses, cups, eating utensils, towels, bedding, or other items with other people in your home. After using these items, you should wash them thoroughly with soap and water.  Cover your coughs and sneezes Cover your mouth and nose with a tissue when you cough or sneeze, or you can cough or sneeze into your sleeve. Throw used tissues in a lined trash can, and immediately wash your hands with soap and water for at least 20 seconds or use an alcohol-based hand rub.  Wash your [facility] your hands often and thoroughly with soap and water for at least 20 seconds. You can use an alcohol-based hand sanitizer if soap and water are not available and if your hands are not visibly dirty. Avoid touching your eyes, nose, and mouth with unwashed hands.   Prevention Steps for Caregivers and Household Members of Individuals Confirmed to have, or Being Evaluated for, COVID-19 Infection Being Cared for in the Home  If you live with, or provide care at home for, a person confirmed to have, or being evaluated for, COVID-19 infection please follow these guidelines to prevent infection:  Follow healthcare providers instructions Make sure that you understand and can help the patient follow any  healthcare provider instructions for all care.  Provide for the patients basic needs You should help the patient with basic needs in the home and provide support for getting groceries, prescriptions, and other personal needs.  Monitor the patients symptoms If they are getting sicker, call his or her medical provider and tell  them that the patient has, or is being evaluated for, COVID-19 infection. This will help the healthcare providers office take steps to keep other people from getting infected. Ask the healthcare provider to call the local or state health department.  Limit the number of people who have contact with the patient If possible, have only one caregiver for the patient. Other household members should stay in another home or place of residence. If this is not possible, they should stay in another room, or be separated from the patient as much as possible. Use a separate bathroom, if available. Restrict visitors who do not have an essential need to be in the home.  Keep older adults, very young children, and other sick people away from the patient Keep older adults, very young children, and those who have compromised immune systems or chronic health conditions away from the patient. This includes people with chronic heart, lung, or kidney conditions, diabetes, and cancer.  Ensure good ventilation Make sure that shared spaces in the home have good air flow, such as from an air conditioner or an opened window, weather permitting.  Wash your hands often Wash your hands often and thoroughly with soap and water for at least 20 seconds. You can use an alcohol based hand sanitizer if soap and water are not available and if your hands are not visibly dirty. Avoid touching your eyes, nose, and mouth with unwashed hands. Use disposable paper towels to dry your hands. If not available, use dedicated cloth towels and replace them when they become wet.  Wear a facemask and gloves Wear a disposable facemask at all times in the room and gloves when you touch or have contact with the patients blood, body fluids, and/or secretions or excretions, such as sweat, saliva, sputum, nasal mucus, vomit, urine, or feces.  Ensure the mask fits over your nose and mouth tightly, and do not touch it during use. Throw out  disposable facemasks and gloves after using them. Do not reuse. Wash your hands immediately after removing your facemask and gloves. If your personal clothing becomes contaminated, carefully remove clothing and launder. Wash your hands after handling contaminated clothing. Place all used disposable facemasks, gloves, and other waste in a lined container before disposing them with other household waste. Remove gloves and wash your hands immediately after handling these items.  Do not share dishes, glasses, or other household items with the patient Avoid sharing household items. You should not share dishes, drinking glasses, cups, eating utensils, towels, bedding, or other items with a patient who is confirmed to have, or being evaluated for, COVID-19 infection. After the person uses these items, you should wash them thoroughly with soap and water.  Wash laundry thoroughly Immediately remove and wash clothes or bedding that have blood, body fluids, and/or secretions or excretions, such as sweat, saliva, sputum, nasal mucus, vomit, urine, or feces, on them. Wear gloves when handling laundry from the patient. Read and follow directions on labels of laundry or clothing items and detergent. In general, wash and dry with the warmest temperatures recommended on the label.  Clean all areas the individual has used often Clean all touchable surfaces, such as counters, tabletops,  doorknobs, bathroom fixtures, toilets, phones, keyboards, tablets, and bedside tables, every day. Also, clean any surfaces that may have blood, body fluids, and/or secretions or excretions on them. Wear gloves when cleaning surfaces the patient has come in contact with. Use a diluted bleach solution (e.g., dilute bleach with 1 part bleach and 10 parts water) or a household disinfectant with a label that says EPA-registered for coronaviruses. To make a bleach solution at home, add 1 tablespoon of bleach to 1 quart (4 cups) of water.  For a larger supply, add  cup of bleach to 1 gallon (16 cups) of water. Read labels of cleaning products and follow recommendations provided on product labels. Labels contain instructions for safe and effective use of the cleaning product including precautions you should take when applying the product, such as wearing gloves or eye protection and making sure you have good ventilation during use of the product. Remove gloves and wash hands immediately after cleaning.  Monitor yourself for signs and symptoms of illness Caregivers and household members are considered close contacts, should monitor their health, and will be asked to limit movement outside of the home to the extent possible. Follow the monitoring steps for close contacts listed on the symptom monitoring form.   ? If you have additional questions, contact your local health department or call the epidemiologist on call at [phone] (available 24/7). ? This guidance is subject to change. For the most up-to-date guidance from [facility], please refer to their website: TripMetro.huhttps://www.cdc.gov/coronavirus/2019-ncov/hcp/guidance-prevent-spread.html

## 2019-07-29 NOTE — ED Triage Notes (Addendum)
Pt in with c/o cough x 1 wk. Reporting bilateral flank pain and L scapular pain. Denies any recent sick contacts. Denies sob or cp, temp 99 in triage

## 2019-07-29 NOTE — ED Provider Notes (Signed)
[name] Provider Note   CSN: [id number] Arrival date & time: 07/29/19  1254     History   Chief Complaint Chief Complaint  Patient presents with  . Back Pain  . Cough    HPI [name] [name] is a 68 y.o. male.     68 yo M with a chief complaints of cough.  [name] on for about a week.  Has been around his brother-in-law who was sick with similar illness.  He has had some chest pains been off and on.  Scribes it is sharp and radiates to the back.  Describes it as a spasm.  Comes and lasts for usually a few seconds and then resolves.  He denies fever.  Denies nausea or vomiting.  The history is provided by the patient.  Back Pain Associated symptoms: chest pain   Associated symptoms: no abdominal pain, no fever and no headaches   Cough Associated symptoms: chest pain   Associated symptoms: no chills, no eye discharge, no fever, no headaches, no myalgias, no rash and no shortness of breath   Illness Severity:  Moderate Onset quality:  Gradual Duration:  1 week Timing:  Constant Progression:  Worsening Chronicity:  New Associated symptoms: chest pain and cough   Associated symptoms: no abdominal pain, no congestion, no diarrhea, no fever, no headaches, no myalgias, no rash, no shortness of breath and no vomiting     Past Medical History:  Diagnosis Date  . Arthritis   . Cataracts, bilateral   . Hyperlipidemia   . Hypertension   . Seasonal allergies   . Sickle cell trait [facility](HCC)     Patient Active Problem List   Diagnosis Date Noted  . Acute coronary syndrome (HCC) 03/28/2019    Past Surgical History:  Procedure Laterality Date  . APPENDECTOMY    . KNEE ARTHROSCOPY WITH ANTERIOR CRUCIATE LIGAMENT (ACL) REPAIR Left   . STENT PLACEMENT VASCULAR ([facility] HX)          Home Medications    Prior to Admission medications   Medication Sig Start Date End Date Taking? Authorizing Provider  acetaminophen (TYLENOL) 325 MG tablet  Take 2 tablets (650 mg total) by mouth every 4 (four) hours as needed for headache or mild pain. 03/31/19   [name] [name]  aspirin 81 MG EC tablet TAKE 1 TABLET BY MOUTH EVERY DAY 04/29/18   Provider, Historical, [name]  atorvastatin (LIPITOR) 20 MG tablet Take 20 mg by mouth daily. 05/26/18   Provider, Historical, [name]  azithromycin (ZITHROMAX Z-PAK) 250 MG tablet Tablets by mouth today and then one every day for 4 more days 03/31/19   [name] [name]  clopidogrel (PLAVIX) 75 MG tablet Take 75 mg by mouth daily. 04/30/18   Provider, Historical, [name]  FERREX 150 150 MG capsule Take 1 capsule by mouth daily. 01/21/19   Provider, Historical, [name]  lisinopril (ZESTRIL) 5 MG tablet Take 5 mg by mouth daily. 02/20/19   Provider, Historical, [name]  metoprolol succinate (TOPROL-XL) 25 MG 24 hr tablet Take 1 tablet (25 mg total) by mouth daily. 03/02/17   [name] [name]  nitroGLYCERIN (NITROLINGUAL) 0.4 MG/SPRAY spray Place 1 spray under the tongue every 5 (five) minutes as needed. For chest pain    Provider, Historical, [name]  tamsulosin (FLOMAX) 0.4 MG CAPS capsule Take 0.4 mg by mouth daily.  05/23/18   Provider, Historical, [name]  vitamin C (ASCORBIC ACID) 500 MG tablet Take 500 mg by mouth daily.  Provider, Historical, [name]    Family History Family History  Problem Relation Age of Onset  . Colon cancer Neg Hx   . Esophageal cancer Neg Hx   . Rectal cancer Neg Hx   . Stomach cancer Neg Hx     Social History Social History   Tobacco Use  . Smoking status: Former Smoker    Quit date: 09/18/2008    Years since quitting: 10.8  . Smokeless tobacco: Former Systems developer    Types: Chew    Quit date: 09/18/1970  Substance Use Topics  . Alcohol use: Yes    Comment: occas  . Drug use: No     Allergies   Other   Review of Systems Review of Systems  Constitutional: Negative for chills and fever.  HENT: Negative for congestion and facial swelling.   Eyes: Negative for discharge and visual disturbance.  Respiratory:  Positive for cough. Negative for shortness of breath.   Cardiovascular: Positive for chest pain. Negative for palpitations.  Gastrointestinal: Negative for abdominal pain, diarrhea and vomiting.  Musculoskeletal: Positive for back pain. Negative for arthralgias and myalgias.  Skin: Negative for color change and rash.  Neurological: Negative for tremors, syncope and headaches.  Psychiatric/Behavioral: Negative for confusion and dysphoric mood.     Physical Exam Updated Vital Signs BP (!) 169/88   Pulse 64   Temp 99.1 F (37.3 C)   Resp 20   SpO2 100%   Physical Exam Vitals signs and nursing note reviewed.  Constitutional:      Appearance: He is well-developed.  HENT:     Head: Normocephalic and atraumatic.  Eyes:     Pupils: Pupils are equal, round, and reactive to light.  Neck:     Musculoskeletal: Normal range of motion and neck supple.     Vascular: No JVD.  Cardiovascular:     Rate and Rhythm: Normal rate and regular rhythm.     Heart sounds: No murmur. No friction rub. No gallop.   Pulmonary:     Effort: No respiratory distress.     Breath sounds: Rales (bases) present. No wheezing.  Abdominal:     General: There is no distension.     Tenderness: There is no abdominal tenderness. There is no guarding or rebound.  Musculoskeletal: Normal range of motion.  Skin:    Coloration: Skin is not pale.     Findings: No rash.  Neurological:     Mental Status: He is alert and oriented to person, place, and time.  Psychiatric:        Behavior: Behavior normal.      ED Treatments / Results  Labs (all labs ordered are listed, but only abnormal results are displayed) Labs Reviewed  CBC WITH DIFFERENTIAL/PLATELET - Abnormal; Notable for the following components:      Result Value   Hemoglobin 12.9 (*)    HCT 38.3 (*)    All other components within normal limits  COMPREHENSIVE METABOLIC PANEL - Abnormal; Notable for the following components:   AST 101 (*)    ALT 78 (*)     All other components within normal limits  TROPONIN I (HIGH SENSITIVITY) - Abnormal; Notable for the following components:   Troponin I (High Sensitivity) 42 (*)    All other components within normal limits  SARS CORONAVIRUS 2 (TAT 6-24 HRS)  TROPONIN I (HIGH SENSITIVITY)    EKG EKG Interpretation  Date/Time:  Tuesday July 29 2019 21:10:40 EST Ventricular Rate:  70 PR Interval:  QRS Duration: 107 QT Interval:  394 QTC Calculation: 426 R Axis:   86 Text Interpretation: Sinus rhythm LAE, consider biatrial enlargement Borderline right axis deviation ST depression, consider ischemia, lateral lds ST elevation, consider anterior injury tall t waves seen on prior ecg Otherwise no significant change Confirmed by Melene Plan [phone]) on 07/29/2019 9:19:39 PM   Radiology Dg Chest 2 View  Result Date: 07/29/2019 CLINICAL DATA:  Chest pain, cough. EXAM: CHEST - 2 VIEW COMPARISON:  March 30, 2019. FINDINGS: The heart size and mediastinal contours are within normal limits. Both lungs are clear. No pneumothorax or pleural effusion is noted. The visualized skeletal structures are unremarkable. IMPRESSION: No active cardiopulmonary disease. Electronically Signed   By: Lupita Raider M.D.   On: 07/29/2019 13:46    Procedures Procedures (including critical care time)  Medications Ordered in ED Medications - No data to display   Initial Impression / Assessment and Plan / ED Course  I have reviewed the triage vital signs and the nursing notes.  Pertinent labs & imaging results that were available during my care of the patient were reviewed by me and considered in my medical decision making (see chart for details).        68 yo M with a chief complaint of a cough.  [name] on for about a week.  He is concerned that he may have the novel coronavirus.  He also concerned that he could have be having a heart attack with some chest pain.  This is atypical in nature.  Initial troponin is at his  baseline.  As the patient is having pain on and off and will obtain a second troponin.  JUANITA DEVINCENT was evaluated in Emergency Department on 07/29/2019 for the symptoms described in the history of present illness. He/she was evaluated in the context of the global COVID-19 pandemic, which necessitated consideration that the patient might be at risk for infection with the SARS-CoV-2 virus that causes COVID-19. Institutional protocols and algorithms that pertain to the evaluation of patients at risk for COVID-19 are in a state of rapid change based on information released by regulatory bodies including the CDC and federal and state organizations. These policies and algorithms were followed during the patient's care in the ED.   Patient care was signed out to Dr. Elesa Massed, please see her note for further details of care in the ED.  The patients results and plan were reviewed and discussed.   Any x-rays performed were independently reviewed by myself.   Differential diagnosis were considered with the presenting HPI.  Medications - No data to display  Vitals:   07/29/19 2115 07/29/19 2130 07/29/19 2145 07/29/19 2200  BP: (!) 167/90 (!) 167/88 (!) 181/91 (!) 169/88  Pulse: 73 65  64  Resp: 13 19 13 20   Temp:      TempSrc:      SpO2: 100% 100%  100%    Final diagnoses:  Viral upper respiratory tract infection  Atypical chest pain      Final Clinical Impressions(s) / ED Diagnoses   Final diagnoses:  Viral upper respiratory tract infection  Atypical chest pain    ED Discharge Orders    None       , [name] 07/29/19 2338

## 2019-07-29 NOTE — ED Provider Notes (Signed)
11:15 PM  Assumed care from Dr. Tyrone Nine.  Patient is a 68 y.o. M with h/o CAD, chronically elevated troponin who presents to ED with atypical chest pain and URI symptoms.  First troponin 42.  Delta troponin due now.  COVID pending.  CXR clear.  No hypoxia or increased WOB.  If no significant change in troponin, will dc home with instructions to quarantine x 10 days.   12:55 AM  Pt's second troponin is 37.  Patient will be discharged.  His Covid test is pending.  He has been given instructions to quarantine for 10 days.  Discussed return precautions.  He is comfortable with this plan.   At this time, I do not feel there is any life-threatening condition present. I have reviewed, interpreted and discussed all results (EKG, imaging, lab, urine as appropriate) and exam findings with patient/family. I have reviewed nursing notes and appropriate previous records.  I feel the patient is safe to be discharged home without further emergent workup and can continue workup as an outpatient as needed. Discussed usual and customary return precautions. Patient/family verbalize understanding and are comfortable with this plan.  Outpatient follow-up has been provided as needed. All questions have been answered.    [name] [name] was evaluated in Emergency Department on 07/30/2019 for the symptoms described in the history of present illness. He was evaluated in the context of the global COVID-19 pandemic, which necessitated consideration that the patient might be at risk for infection with the SARS-CoV-2 virus that causes COVID-19. Institutional protocols and algorithms that pertain to the evaluation of patients at risk for COVID-19 are in a state of rapid change based on information released by regulatory bodies including the CDC and federal and state organizations. These policies and algorithms were followed during the patient's care in the ED.     Ward, Delice Bison, DO 07/30/19 [phone]

## 2019-07-29 NOTE — ED Notes (Signed)
Pt ambulated from [place] to room 35. Upon arrival to room, patient endorse current c/o of back pain and some cough along w/ palpitations and some shortness of breathe. Pt also reported prior hx of stents.

## 2019-07-30 LAB — SARS CORONAVIRUS 2 (TAT 6-24 HRS): SARS Coronavirus 2: NEGATIVE

## 2019-07-30 LAB — TROPONIN I (HIGH SENSITIVITY): Troponin I (High Sensitivity): 37 ng/L — ABNORMAL HIGH (ref <18)

## 2019-07-30 MED ORDER — BENZONATATE 100 MG PO CAPS: 100.0000 mg | ORAL_CAPSULE | Freq: Three times a day (TID) | ORAL | 0 refills | Status: DC | PRN

## 2019-08-15 ENCOUNTER — Emergency Department (HOSPITAL_COMMUNITY)
Admission: EM | Admit: 2019-08-15 | Discharge: 2019-08-15 | Disposition: A | Discharge status: Home / Self Care | Payer: Medicare HMO | Attending: Emergency Medicine | Admitting: Emergency Medicine

## 2019-08-15 ENCOUNTER — Other Ambulatory Visit: Payer: Self-pay

## 2019-08-15 ENCOUNTER — Emergency Department (HOSPITAL_COMMUNITY): Payer: Medicare HMO

## 2019-08-15 DIAGNOSIS — R079 Chest pain, unspecified: Secondary | ICD-10-CM

## 2019-08-15 DIAGNOSIS — Z79899 Other long term (current) drug therapy: Secondary | ICD-10-CM | POA: Insufficient documentation

## 2019-08-15 DIAGNOSIS — R55 Syncope and collapse: Secondary | ICD-10-CM | POA: Diagnosis not present

## 2019-08-15 DIAGNOSIS — Z87891 Personal history of nicotine dependence: Secondary | ICD-10-CM | POA: Diagnosis not present

## 2019-08-15 DIAGNOSIS — R0789 Other chest pain: Secondary | ICD-10-CM | POA: Insufficient documentation

## 2019-08-15 DIAGNOSIS — Z7982 Long term (current) use of aspirin: Secondary | ICD-10-CM | POA: Insufficient documentation

## 2019-08-15 DIAGNOSIS — I10 Essential (primary) hypertension: Secondary | ICD-10-CM | POA: Diagnosis not present

## 2019-08-15 DIAGNOSIS — R42 Dizziness and giddiness: Secondary | ICD-10-CM | POA: Diagnosis not present

## 2019-08-15 DIAGNOSIS — I2510 Atherosclerotic heart disease of native coronary artery without angina pectoris: Secondary | ICD-10-CM | POA: Diagnosis not present

## 2019-08-15 DIAGNOSIS — Z7901 Long term (current) use of anticoagulants: Secondary | ICD-10-CM | POA: Diagnosis not present

## 2019-08-15 LAB — CBC WITH DIFFERENTIAL/PLATELET
Abs Immature Granulocytes: 0.02 10*3/uL (ref 0.00–0.07)
Basophils Absolute: 0.1 10*3/uL (ref 0.0–0.1)
Basophils Relative: 1 %
Eosinophils Absolute: 0.2 10*3/uL (ref 0.0–0.5)
Eosinophils Relative: 3 %
HCT: 38 % — ABNORMAL LOW (ref 39.0–52.0)
Hemoglobin: 12.7 g/dL — ABNORMAL LOW (ref 13.0–17.0)
Immature Granulocytes: 0 %
Lymphocytes Relative: 27 %
Lymphs Abs: 1.5 10*3/uL (ref 0.7–4.0)
MCH: 29.3 pg (ref 26.0–34.0)
MCHC: 33.4 g/dL (ref 30.0–36.0)
MCV: 87.8 fL (ref 80.0–100.0)
Monocytes Absolute: 0.6 10*3/uL (ref 0.1–1.0)
Monocytes Relative: 11 %
Neutro Abs: 3.1 10*3/uL (ref 1.7–7.7)
Neutrophils Relative %: 58 %
Platelets: 200 10*3/uL (ref 150–400)
RBC: 4.33 MIL/uL (ref 4.22–5.81)
RDW: 12.4 % (ref 11.5–15.5)
WBC: 5.4 10*3/uL (ref 4.0–10.5)
nRBC: 0 % (ref 0.0–0.2)

## 2019-08-15 LAB — COMPREHENSIVE METABOLIC PANEL
ALT: 72 U/L — ABNORMAL HIGH (ref 0–44)
AST: 93 U/L — ABNORMAL HIGH (ref 15–41)
Albumin: 3.6 g/dL (ref 3.5–5.0)
Alkaline Phosphatase: 74 U/L (ref 38–126)
Anion gap: 11 (ref 5–15)
BUN: 12 mg/dL (ref 8–23)
CO2: 23 mmol/L (ref 22–32)
Calcium: 8.4 mg/dL — ABNORMAL LOW (ref 8.9–10.3)
Chloride: 102 mmol/L (ref 98–111)
Creatinine, Ser: 1.23 mg/dL (ref 0.61–1.24)
GFR calc Af Amer: >60 mL/min (ref >60)
GFR calc non Af Amer: >60 mL/min (ref >60)
Glucose, Bld: 126 mg/dL — ABNORMAL HIGH (ref 70–99)
Potassium: 3.7 mmol/L (ref 3.5–5.1)
Sodium: 136 mmol/L (ref 135–145)
Total Bilirubin: 1.4 mg/dL — ABNORMAL HIGH (ref 0.3–1.2)
Total Protein: 6.3 g/dL — ABNORMAL LOW (ref 6.5–8.1)

## 2019-08-15 LAB — TROPONIN I (HIGH SENSITIVITY)
Troponin I (High Sensitivity): 33 ng/L — ABNORMAL HIGH (ref <18)
Troponin I (High Sensitivity): 34 ng/L — ABNORMAL HIGH (ref <18)

## 2019-08-15 LAB — BRAIN NATRIURETIC PEPTIDE: B Natriuretic Peptide: 20.3 pg/mL (ref 0.0–100.0)

## 2019-08-15 MED ORDER — SODIUM CHLORIDE 0.9 % IV BOLUS
1000.0000 mL | Freq: Once | INTRAVENOUS | Status: AC
  Administered 2019-08-15: 17:00:00 1000 mL via INTRAVENOUS

## 2019-08-15 NOTE — ED Provider Notes (Signed)
[facility] EMERGENCY DEPARTMENT Provider Note   CSN: [id number] Arrival date & time: 08/15/19  1555     History   Chief Complaint Chief Complaint  Patient presents with  . Near Syncope  . Hypotension    HPI [name] [name] is a 68 y.o. male.  Presents emergency room with chief complaint chest pain, near single episode, lightheadedness.  Patient states while he was at [facility], started to feel somewhat uneasy, weak.  Feeling more lightheaded and also experiencing chest pain.  Pain up to 8 out of 10 severity, heaviness, tightness, sensation of an elephant sitting on his chest.  States this in total lasted around 30 minutes, improved after he was given 324 mg aspirin.  Currently not having any chest pressure.  States he gets episodes of chest pain occasionally, normally with exertion, but not as severe as today.  States while he was having the symptoms he tried going to the bathroom and almost passed out when he stood up from the bathroom.  Unsure if he had complete loss of consciousness.  EMS reported patient's blood pressure initially 80/40 on arrival, improved after 500 mL bolus.    PMH: coronary artery disease, status post PCI to LAD in remote past, hypertension, hyperlipidemia, tobacco abuse, osteoarthritis     HPI  Past Medical History:  Diagnosis Date  . Arthritis   . Cataracts, bilateral   . Hyperlipidemia   . Hypertension   . Seasonal allergies   . Sickle cell trait [facility])     Patient Active Problem List   Diagnosis Date Noted  . Acute coronary syndrome ([place]) 03/28/2019    Past Surgical History:  Procedure Laterality Date  . APPENDECTOMY    . KNEE ARTHROSCOPY WITH ANTERIOR CRUCIATE LIGAMENT (ACL) REPAIR Left   . STENT PLACEMENT VASCULAR ([place] HX)          Home Medications    Prior to Admission medications   Medication Sig Start Date End Date Taking? Authorizing Provider  acetaminophen (TYLENOL) 325 MG tablet Take 2 tablets (650 mg  total) by mouth every 4 (four) hours as needed for headache or mild pain. 03/31/19  Yes Charolette Forward, MD  albuterol (VENTOLIN HFA) 108 (90 Base) MCG/ACT inhaler Inhale 2 puffs into the lungs See admin instructions. Inhale 2 puffs into the lungs every 4-6 hours as needed for shortness of breath or wheezing 07/02/19  Yes [provider]  aspirin 81 MG EC tablet Take 81 mg by mouth daily.  04/29/18  Yes [provider]  benzonatate (TESSALON) 100 MG capsule Take 1 capsule (100 mg total) by mouth 3 (three) times daily as needed for cough. 07/30/19  Yes Ward, Delice Bison, DO  carboxymethylcellul-glycerin (REFRESH OPTIVE) 0.5-0.9 % ophthalmic solution Place 1 drop into both eyes 3 (three) times daily as needed for dry eyes.   Yes [provider]  clopidogrel (PLAVIX) 75 MG tablet Take 75 mg by mouth daily. 04/30/18  Yes [provider]  iron polysaccharides (NU-IRON) 150 MG capsule Take 150 mg by mouth daily.   Yes [provider]  lisinopril (ZESTRIL) 10 MG tablet Take 10 mg by mouth daily.   Yes [provider]  Menthol, Topical Analgesic, (BIOFREEZE) 4 % GEL Apply 1 application topically 4 (four) times daily as needed (to painful sites).   Yes [provider]  metoprolol succinate (TOPROL-XL) 25 MG 24 hr tablet Take 1 tablet (25 mg total) by mouth daily. 03/02/17  Yes Gildardo Cranker, DO  nitroGLYCERIN (  NITROLINGUAL) 0.4 MG/SPRAY spray Place 1 spray under the tongue every 5 (five) minutes as needed for chest pain.    Yes [provider]  tamsulosin (FLOMAX) 0.4 MG CAPS capsule Take 0.4 mg by mouth daily.  05/23/18  Yes [provider]  vitamin C (ASCORBIC ACID) 500 MG tablet Take 500 mg by mouth daily.   Yes [provider]  azithromycin (ZITHROMAX Z-PAK) 250 MG tablet Tablets by mouth today and then one every day for 4 more days Patient not taking: Reported on 08/15/2019 03/31/19   Rinaldo Cloud, MD    Family History  Family History  Problem Relation Age of Onset  . Colon cancer Neg Hx   . Esophageal cancer Neg Hx   . Rectal cancer Neg Hx   . Stomach cancer Neg Hx     Social History Social History   Tobacco Use  . Smoking status: Former Smoker    Quit date: 09/18/2008    Years since quitting: 10.9  . Smokeless tobacco: Former Neurosurgeon    Types: Chew    Quit date: 09/18/1970  Substance Use Topics  . Alcohol use: Yes    Comment: occas  . Drug use: No     Allergies   Shellfish-derived products   Review of Systems Review of Systems   Physical Exam Updated Vital Signs BP 123/68   Pulse 61   Temp 97.6 F (36.4 C) (Oral)   Resp 17   Ht 5\' 11"  (1.803 m)   Wt 65.8 kg   SpO2 100%   BMI 20.22 kg/m   Physical Exam   ED Treatments / Results  Labs (all labs ordered are listed, but only abnormal results are displayed) Labs Reviewed  CBC WITH DIFFERENTIAL/PLATELET - Abnormal; Notable for the following components:      Result Value   Hemoglobin 12.7 (*)    HCT 38.0 (*)    All other components within normal limits  COMPREHENSIVE METABOLIC PANEL  BRAIN NATRIURETIC PEPTIDE  TROPONIN I (HIGH SENSITIVITY)    EKG EKG Interpretation  Date/Time:  Friday August 15 2019 15:58:10 EST Ventricular Rate:  65 PR Interval:    QRS Duration: 105 QT Interval:  441 QTC Calculation: 459 R Axis:   77 Text Interpretation: Sinus rhythm Probable left atrial enlargement Anteroseptal infarct, age indeterminate Confirmed by 09-16-1983 (Marianna Fuss) on 08/15/2019 4:27:25 PM   Radiology No results found.  Procedures Procedures (including critical care time)  Medications Ordered in ED Medications  sodium chloride 0.9 % bolus 1,000 mL (1,000 mLs Intravenous New Bag/Given 08/15/19 1634)     Initial Impression / Assessment and Plan / ED Course  I have reviewed the triage vital signs and the nursing notes.  Pertinent labs & imaging results that were available during my care of the patient were  reviewed by me and considered in my medical decision making (see chart for details).  Clinical Course as of Aug 15 6  Fri Aug 15, 2019  1739 Reviewed labs, will consult harwani   [RD]  1748 Reviewed with his cardiologist.  Dr. 1749 recommends checking second troponin, if stable or downtrending, recommends discharge and outpatient follow-up on Monday in his clinic.  If any significant increase though, likely would need admission   [RD]    Clinical Course User Index [RD] Tuesday, MD       68 year old male with CAD presents to ER with episode of chest pain, near syncope.  Here patient was asymptomatic, normal vital signs.  EKG  without ischemic changes.  Troponin x2 within normal limits.  Labs otherwise wnl. Normal cxr. Given his history, reviewed with cardiology, recommended close follow-up outpatient with Dr. Sharyn LullHarwani.  Given work-up, and discussion with cardiology, will discharge home.  Final Clinical Impressions(s) / ED Diagnoses   Final diagnoses:  Chest pain, unspecified type    ED Discharge Orders    None       Milagros Lollykstra, Tamas Suen S, MD 08/16/19 0009

## 2019-08-15 NOTE — ED Notes (Signed)
Pt ambulated to the restroom.

## 2019-08-15 NOTE — ED Notes (Signed)
Patient transported to X-ray 

## 2019-08-15 NOTE — ED Notes (Signed)
Discharge instructions reviewed with pt. Pt verbalized understanding.   

## 2019-08-15 NOTE — ED Triage Notes (Signed)
Pt arrives via [facility] from [facility] where he was using the restroom and then began to feel weak, dizzy and have chest pain. 8/10. EMS reports the pt blood pressure was 80/40 on arrival and after a 500 mL bolus it improved to 120/62. Ems administered 324 mg Asprin. Pain then stated chest pain decreased to 3/10

## 2019-08-15 NOTE — Discharge Instructions (Addendum)
Please call Dr. Zenia Resides office to schedule appointment to be seen on Monday.  If you develop any further chest pain, difficulty breathing, passing out, lightheadedness or other new concerning symptom recommend return to ER for reassessment.

## 2019-08-22 ENCOUNTER — Other Ambulatory Visit: Payer: Self-pay

## 2019-08-22 DIAGNOSIS — Z20822 Contact with and (suspected) exposure to covid-19: Secondary | ICD-10-CM

## 2019-08-25 LAB — NOVEL CORONAVIRUS, NAA: SARS-CoV-2, NAA: NOT DETECTED

## 2020-01-06 ENCOUNTER — Other Ambulatory Visit: Payer: Self-pay | Admitting: Internal Medicine

## 2020-01-06 DIAGNOSIS — R634 Abnormal weight loss: Secondary | ICD-10-CM

## 2020-01-08 ENCOUNTER — Other Ambulatory Visit: Payer: Self-pay | Admitting: Internal Medicine

## 2020-01-08 DIAGNOSIS — R2231 Localized swelling, mass and lump, right upper limb: Secondary | ICD-10-CM

## 2020-01-16 ENCOUNTER — Other Ambulatory Visit: Payer: Self-pay | Admitting: Internal Medicine

## 2020-01-16 DIAGNOSIS — W19XXXA Unspecified fall, initial encounter: Secondary | ICD-10-CM

## 2020-01-20 ENCOUNTER — Other Ambulatory Visit: Payer: Medicare HMO

## 2020-01-23 ENCOUNTER — Other Ambulatory Visit: Payer: Self-pay | Admitting: Internal Medicine

## 2020-01-26 ENCOUNTER — Ambulatory Visit
Admission: RE | Admit: 2020-01-26 | Discharge: 2020-01-26 | Disposition: A | Discharge status: Home / Self Care | Payer: Medicare HMO | Source: Ambulatory Visit | Attending: Internal Medicine | Admitting: Internal Medicine

## 2020-01-26 ENCOUNTER — Other Ambulatory Visit: Payer: Self-pay | Admitting: Internal Medicine

## 2020-01-26 ENCOUNTER — Inpatient Hospital Stay: Admission: RE | Admit: 2020-01-26 | Payer: Medicare HMO | Source: Ambulatory Visit

## 2020-01-26 ENCOUNTER — Other Ambulatory Visit: Payer: Medicare HMO

## 2020-01-26 DIAGNOSIS — R2231 Localized swelling, mass and lump, right upper limb: Secondary | ICD-10-CM

## 2020-01-28 ENCOUNTER — Ambulatory Visit
Admission: RE | Admit: 2020-01-28 | Discharge: 2020-01-28 | Disposition: A | Discharge status: Home / Self Care | Payer: Medicare HMO | Source: Ambulatory Visit | Attending: Internal Medicine | Admitting: Internal Medicine

## 2020-01-28 ENCOUNTER — Other Ambulatory Visit: Payer: Self-pay

## 2020-01-28 DIAGNOSIS — W19XXXA Unspecified fall, initial encounter: Secondary | ICD-10-CM

## 2020-02-12 ENCOUNTER — Ambulatory Visit
Admission: RE | Admit: 2020-02-12 | Discharge: 2020-02-12 | Disposition: A | Discharge status: Home / Self Care | Payer: Medicare HMO | Source: Ambulatory Visit | Attending: Internal Medicine | Admitting: Internal Medicine

## 2020-02-12 ENCOUNTER — Other Ambulatory Visit: Payer: Self-pay

## 2020-02-12 DIAGNOSIS — R634 Abnormal weight loss: Secondary | ICD-10-CM

## 2020-02-12 MED ORDER — IOPAMIDOL (ISOVUE-300) INJECTION 61%
100.0000 mL | Freq: Once | INTRAVENOUS | Status: AC | PRN
  Administered 2020-02-12: 100 mL via INTRAVENOUS

## 2020-02-13 ENCOUNTER — Other Ambulatory Visit: Payer: Self-pay | Admitting: Internal Medicine

## 2020-03-29 ENCOUNTER — Ambulatory Visit (INDEPENDENT_AMBULATORY_CARE_PROVIDER_SITE_OTHER): Payer: Medicare HMO

## 2020-03-29 ENCOUNTER — Other Ambulatory Visit: Payer: Self-pay

## 2020-03-29 ENCOUNTER — Ambulatory Visit: Payer: Medicare HMO | Admitting: Physician Assistant

## 2020-03-29 ENCOUNTER — Ambulatory Visit: Payer: Self-pay

## 2020-03-29 ENCOUNTER — Encounter: Payer: Self-pay | Admitting: Physician Assistant

## 2020-03-29 DIAGNOSIS — M25551 Pain in right hip: Secondary | ICD-10-CM

## 2020-03-29 DIAGNOSIS — M25562 Pain in left knee: Secondary | ICD-10-CM | POA: Diagnosis not present

## 2020-03-29 DIAGNOSIS — G8929 Other chronic pain: Secondary | ICD-10-CM

## 2020-03-29 DIAGNOSIS — M25552 Pain in left hip: Secondary | ICD-10-CM

## 2020-03-29 DIAGNOSIS — M25561 Pain in right knee: Secondary | ICD-10-CM

## 2020-03-29 NOTE — Progress Notes (Signed)
Office Visit Note   Patient: [name] [name]           Date of Birth: Aug 14, 1951           MRN: [id number] Visit Date: 03/29/2020              Requested by: Harvest Forest, MD [address] [place],  [place] [address] PCP: Harvest Forest, MD   Assessment & Plan: Visit Diagnoses:  1. Bilateral hip pain   2. Chronic pain of right knee   3. Chronic pain of left knee     Plan: Discussed with the patient the radiographic findings which show end-stage arthritis of the left hip and near end-stage arthritis of the right hip.  After discussing with him the radiographic findings and the fact that I do not feel the benefit from a intra-articular injection of the left hip but do feel it that he will benefit from a right hip intra-articular injection.  Patient wishes to proceed with a right hip intra-articular injection under ultrasound.  We will set this up with Dr. Stasia Cavalier was able to perform this later this afternoon.  In regards to the left hip due to the fact the patient had a recent MI with 4 stent placements 2 weeks ago goes on anticoagulation he is not a candidate for surgery at this point time.  We will leave it up to his cardiologist as to when he could possibly undergo a left total hip arthroplasty.  We will see him back in 3 months to see how he is doing in regards to both hips and discuss when his cardiologist feels be most appropriate for him to proceed with surgery.  In regards to his left knee arthritic changes.  [place] under the patellofemoral region and is quad atrophy bilaterally recommend quad strengthening.  Also recommend that he use a walker and not a cane due to the fact that he feels that both of his legs do give way on him at times.  Questions were encouraged and answered both the patient and his family members who were present throughout examination today.  Follow-Up Instructions: Return in about 3 months (around 06/29/2020).   Orders:  Orders Placed This  Encounter  Procedures  . XR Pelvis 1-2 Views  . XR Knee 1-2 Views Right  . XR Knee 1-2 Views Left  . US Guided Needle Placement - No Linked Charges   No orders of the defined types were placed in this encounter.     Procedures: No procedures performed   Clinical Data: No additional findings.   Subjective: No chief complaint on file.   HPI Mr. Udell is a 69 year old male seen for the first time comes in today with bilateral leg pain that radiates down to both knees.  He and his arms the pain is sharp achy pain.  Denies any numbness tingling down either leg.  His pain lateral aspects of both hips some groin pain at times and bilateral knee pain.  He feels that both legs give way on him at times.  He has had some falling episodes in the past but relates that the last episode in which she had dizziness is and loss of consciousness was back 2 months ago.  Has difficulty with steps.  Cannot stand for prolonged period time due to the pain in both legs.  He reports he is tried Voltaren gel [place] both without any relief.  He also uses TENS unit on both legs  states this helps some.  Unfortunately had an MRI he reports 2 weeks ago and underwent 4 cardiac stents being placed.  He is on anticoagulation due to the stent placement and MI.  Patient is nondiabetic.  He denies any back pain.  Does have difficulty donning shoes and socks due to the bilateral leg pain. Review of Systems See HPI  Objective: Vital Signs: There were no vitals taken for this visit.  Physical Exam Constitutional:      Appearance: He is not ill-appearing or diaphoretic.  Cardiovascular:     Pulses: Normal pulses.  Pulmonary:     Effort: Pulmonary effort is normal.  Neurological:     Mental Status: He is alert and oriented to person, place, and time.  Psychiatric:        Mood and Affect: Mood normal.     Ortho Exam Lower extremity she has spine upon strength throughout the lower extremities against resistance.   Negative straight leg raise bilaterally.  Quad atrophy bilaterally.  Significant patellofemoral crepitus left knee only.  No instability valgus varus stressing of either knee.  No abnormal warmth erythema or effusion of either knee.  Bilateral hips good range of motion of both hips.  Has some discomfort with internal rotation of the left hip.  Specialty Comments:  No specialty comments available.  Imaging: US Guided Needle Placement - No Linked Charges  Result Date: 03/29/2020 Ultrasound-guided right hip injection: After sterile prep with Betadine, injected 8 cc 1% lidocaine without epinephrine and 40 mg methylprednisolone using a 22-gauge spinal needle, passing the needle through the iliofemoral ligament into the femoral head/neck junction.  Injectate seen filling joint capsule.    XR Knee 1-2 Views Left  Result Date: 03/29/2020 Left knee 2 views: No acute fractures.  Moderate patellofemoral changes.  Medial compartment with some mild arthritic changes.  Interference screw from previous ACL without any evidence of hardware failure.  Knee is well located.  XR Knee 1-2 Views Right  Result Date: 03/29/2020 Right knee 2 views: No acute fractures.  Slight medial compartmental arthritic changes otherwise knee is well-preserved.  Knee is well located.  No bony abnormalities otherwise.  XR Pelvis 1-2 Views  Result Date: 03/29/2020 AP pelvis: Bilateral hips are well located.  End-stage arthritis of the left hip.  Near bone-on-bone right hip.  No acute fractures.  No bony abnormalities otherwise.    PMFS History: Patient Active Problem List   Diagnosis Date Noted  . Acute coronary syndrome ([facility]) 03/28/2019   Past Medical History:  Diagnosis Date  . Arthritis   . Cataracts, bilateral   . Hyperlipidemia   . Hypertension   . Seasonal allergies   . Sickle cell trait ([facility])     Family History  Problem Relation Age of Onset  . Colon cancer Neg Hx   . Esophageal cancer Neg Hx   . Rectal  cancer Neg Hx   . Stomach cancer Neg Hx     Past Surgical History:  Procedure Laterality Date  . APPENDECTOMY    . KNEE ARTHROSCOPY WITH ANTERIOR CRUCIATE LIGAMENT (ACL) REPAIR Left   . STENT PLACEMENT VASCULAR ([facility]X)     Social History   Occupational History  . Occupation: Charity fundraiser at [employer]  Tobacco Use  . Smoking status: Former Smoker    Quit date: 09/18/2008    Years since quitting: 11.5  . Smokeless tobacco: Former Neurosurgeon    Types: Chew    Quit date: 09/18/1970  Vaping Use  . Vaping  Use: Never used  Substance and Sexual Activity  . Alcohol use: Yes    Comment: occas  . Drug use: No  . Sexual activity: Never

## 2020-03-29 NOTE — Progress Notes (Signed)
Subjective: Patient is here for ultrasound-guided intra-articular right hip injection.   Pain from DJD.  Objective:  Pain with passive IR.  Procedure: Ultrasound-guided right hip injection: After sterile prep with Betadine, injected 8 cc 1% lidocaine without epinephrine and 40 mg methylprednisolone using a 22-gauge spinal needle, passing the needle through the iliofemoral ligament into the femoral head/neck junction.  Injectate seen filling joint capsule.

## 2020-03-29 NOTE — Patient Instructions (Signed)
    Injection for knees:  Hyaluronic acid (viscosupplementation is another name for them)

## 2020-04-27 ENCOUNTER — Other Ambulatory Visit: Payer: Self-pay | Admitting: *Deleted

## 2020-04-27 DIAGNOSIS — R0602 Shortness of breath: Secondary | ICD-10-CM

## 2020-04-29 ENCOUNTER — Other Ambulatory Visit: Payer: Self-pay

## 2020-04-29 ENCOUNTER — Other Ambulatory Visit: Payer: Self-pay | Admitting: Internal Medicine

## 2020-04-29 ENCOUNTER — Ambulatory Visit
Admission: RE | Admit: 2020-04-29 | Discharge: 2020-04-29 | Disposition: A | Discharge status: Home / Self Care | Payer: Medicare HMO | Source: Ambulatory Visit | Attending: Internal Medicine | Admitting: Internal Medicine

## 2020-04-29 DIAGNOSIS — R0989 Other specified symptoms and signs involving the circulatory and respiratory systems: Secondary | ICD-10-CM

## 2020-04-29 DIAGNOSIS — R05 Cough: Secondary | ICD-10-CM

## 2020-04-29 DIAGNOSIS — R059 Cough, unspecified: Secondary | ICD-10-CM

## 2020-05-09 ENCOUNTER — Emergency Department (HOSPITAL_COMMUNITY): Payer: Medicare HMO

## 2020-05-09 ENCOUNTER — Encounter (HOSPITAL_COMMUNITY): Payer: Self-pay

## 2020-05-09 ENCOUNTER — Other Ambulatory Visit: Payer: Self-pay

## 2020-05-09 ENCOUNTER — Inpatient Hospital Stay (HOSPITAL_COMMUNITY)
Admission: EM | Admit: 2020-05-09 | Discharge: 2020-05-11 | DRG: 281 | Disposition: A | Discharge status: Home / Self Care | Payer: Medicare HMO | Attending: Cardiovascular Disease | Admitting: Cardiovascular Disease

## 2020-05-09 DIAGNOSIS — D649 Anemia, unspecified: Secondary | ICD-10-CM | POA: Diagnosis present

## 2020-05-09 DIAGNOSIS — I214 Non-ST elevation (NSTEMI) myocardial infarction: Secondary | ICD-10-CM | POA: Diagnosis present

## 2020-05-09 DIAGNOSIS — N179 Acute kidney failure, unspecified: Secondary | ICD-10-CM | POA: Diagnosis present

## 2020-05-09 DIAGNOSIS — Z955 Presence of coronary angioplasty implant and graft: Secondary | ICD-10-CM | POA: Diagnosis not present

## 2020-05-09 DIAGNOSIS — Z7902 Long term (current) use of antithrombotics/antiplatelets: Secondary | ICD-10-CM | POA: Diagnosis not present

## 2020-05-09 DIAGNOSIS — E785 Hyperlipidemia, unspecified: Secondary | ICD-10-CM | POA: Diagnosis present

## 2020-05-09 DIAGNOSIS — I959 Hypotension, unspecified: Secondary | ICD-10-CM | POA: Diagnosis present

## 2020-05-09 DIAGNOSIS — I2510 Atherosclerotic heart disease of native coronary artery without angina pectoris: Secondary | ICD-10-CM | POA: Diagnosis present

## 2020-05-09 DIAGNOSIS — Z91013 Allergy to seafood: Secondary | ICD-10-CM

## 2020-05-09 DIAGNOSIS — I10 Essential (primary) hypertension: Secondary | ICD-10-CM | POA: Diagnosis present

## 2020-05-09 DIAGNOSIS — M1990 Unspecified osteoarthritis, unspecified site: Secondary | ICD-10-CM | POA: Diagnosis present

## 2020-05-09 DIAGNOSIS — Z20822 Contact with and (suspected) exposure to covid-19: Secondary | ICD-10-CM | POA: Diagnosis present

## 2020-05-09 DIAGNOSIS — R079 Chest pain, unspecified: Secondary | ICD-10-CM | POA: Diagnosis present

## 2020-05-09 DIAGNOSIS — D573 Sickle-cell trait: Secondary | ICD-10-CM | POA: Diagnosis present

## 2020-05-09 DIAGNOSIS — Z79899 Other long term (current) drug therapy: Secondary | ICD-10-CM

## 2020-05-09 DIAGNOSIS — I249 Acute ischemic heart disease, unspecified: Secondary | ICD-10-CM | POA: Diagnosis present

## 2020-05-09 DIAGNOSIS — Z87891 Personal history of nicotine dependence: Secondary | ICD-10-CM

## 2020-05-09 DIAGNOSIS — E876 Hypokalemia: Secondary | ICD-10-CM | POA: Diagnosis present

## 2020-05-09 LAB — TROPONIN I (HIGH SENSITIVITY)
Troponin I (High Sensitivity): 115 ng/L (ref <18)
Troponin I (High Sensitivity): 792 ng/L (ref <18)

## 2020-05-09 LAB — COMPREHENSIVE METABOLIC PANEL
ALT: 30 U/L (ref 0–44)
AST: 42 U/L — ABNORMAL HIGH (ref 15–41)
Albumin: 3.6 g/dL (ref 3.5–5.0)
Alkaline Phosphatase: 92 U/L (ref 38–126)
Anion gap: 13 (ref 5–15)
BUN: 6 mg/dL — ABNORMAL LOW (ref 8–23)
CO2: 25 mmol/L (ref 22–32)
Calcium: 8.9 mg/dL (ref 8.9–10.3)
Chloride: 103 mmol/L (ref 98–111)
Creatinine, Ser: 1.5 mg/dL — ABNORMAL HIGH (ref 0.61–1.24)
GFR calc Af Amer: 55 mL/min — ABNORMAL LOW (ref >60)
GFR calc non Af Amer: 48 mL/min — ABNORMAL LOW (ref >60)
Glucose, Bld: 90 mg/dL (ref 70–99)
Potassium: 2.8 mmol/L — ABNORMAL LOW (ref 3.5–5.1)
Sodium: 141 mmol/L (ref 135–145)
Total Bilirubin: 0.9 mg/dL (ref 0.3–1.2)
Total Protein: 6.9 g/dL (ref 6.5–8.1)

## 2020-05-09 LAB — CBC
HCT: 34.5 % — ABNORMAL LOW (ref 39.0–52.0)
Hemoglobin: 11 g/dL — ABNORMAL LOW (ref 13.0–17.0)
MCH: 28.7 pg (ref 26.0–34.0)
MCHC: 31.9 g/dL (ref 30.0–36.0)
MCV: 90.1 fL (ref 80.0–100.0)
Platelets: 201 10*3/uL (ref 150–400)
RBC: 3.83 MIL/uL — ABNORMAL LOW (ref 4.22–5.81)
RDW: 13.1 % (ref 11.5–15.5)
WBC: 11 10*3/uL — ABNORMAL HIGH (ref 4.0–10.5)
nRBC: 0 % (ref 0.0–0.2)

## 2020-05-09 LAB — SARS CORONAVIRUS 2 BY RT PCR (HOSPITAL ORDER, PERFORMED IN ~~LOC~~ HOSPITAL LAB): SARS Coronavirus 2: NEGATIVE

## 2020-05-09 MED ORDER — LISINOPRIL 10 MG PO TABS
10.0000 mg | ORAL_TABLET | Freq: Every day | ORAL | Status: DC
  Filled 2020-05-09: qty 1

## 2020-05-09 MED ORDER — POLYSACCHARIDE IRON COMPLEX 150 MG PO CAPS: 150.0000 mg | ORAL_CAPSULE | Freq: Every day | ORAL | Status: DC

## 2020-05-09 MED ORDER — ATORVASTATIN CALCIUM 40 MG PO TABS
40.0000 mg | ORAL_TABLET | Freq: Every day | ORAL | Status: DC
  Administered 2020-05-09: 40 mg via ORAL
  Filled 2020-05-09 (×2): qty 1

## 2020-05-09 MED ORDER — PRAMIPEXOLE DIHYDROCHLORIDE 0.125 MG PO TABS
0.1250 mg | ORAL_TABLET | Freq: Every day | ORAL | Status: DC
  Administered 2020-05-09 – 2020-05-10 (×2): 0.125 mg via ORAL
  Filled 2020-05-09 (×3): qty 1

## 2020-05-09 MED ORDER — FOLIC ACID 1 MG PO TABS
1.0000 mg | ORAL_TABLET | Freq: Every day | ORAL | Status: DC
  Administered 2020-05-10 – 2020-05-11 (×2): 1 mg via ORAL
  Filled 2020-05-09 (×3): qty 1

## 2020-05-09 MED ORDER — TAMSULOSIN HCL 0.4 MG PO CAPS
0.4000 mg | ORAL_CAPSULE | Freq: Every day | ORAL | Status: DC
  Administered 2020-05-10 – 2020-05-11 (×2): 0.4 mg via ORAL
  Filled 2020-05-09 (×2): qty 1

## 2020-05-09 MED ORDER — PANTOPRAZOLE SODIUM 40 MG PO TBEC
40.0000 mg | DELAYED_RELEASE_TABLET | Freq: Every day | ORAL | Status: DC
  Administered 2020-05-10 – 2020-05-11 (×2): 40 mg via ORAL
  Filled 2020-05-09 (×2): qty 1

## 2020-05-09 MED ORDER — ASCORBIC ACID 500 MG PO TABS: 500.0000 mg | ORAL_TABLET | Freq: Every day | ORAL | Status: DC

## 2020-05-09 MED ORDER — NEPAFENAC 0.3 % OP SUSP: 1.0000 [drp] | Freq: Every day | OPHTHALMIC | Status: DC

## 2020-05-09 MED ORDER — CHLORDIAZEPOXIDE HCL 25 MG PO CAPS
25.0000 mg | ORAL_CAPSULE | Freq: Every day | ORAL | Status: DC
  Administered 2020-05-09 – 2020-05-10 (×2): 25 mg via ORAL
  Filled 2020-05-09 (×2): qty 1

## 2020-05-09 MED ORDER — ASPIRIN EC 81 MG PO TBEC
81.0000 mg | DELAYED_RELEASE_TABLET | Freq: Every day | ORAL | Status: DC
  Administered 2020-05-10: 81 mg via ORAL
  Filled 2020-05-09 (×2): qty 1

## 2020-05-09 MED ORDER — POTASSIUM CHLORIDE CRYS ER 10 MEQ PO TBCR
40.0000 meq | EXTENDED_RELEASE_TABLET | Freq: Two times a day (BID) | ORAL | Status: AC
  Administered 2020-05-09 – 2020-05-11 (×4): 40 meq via ORAL
  Filled 2020-05-09 (×5): qty 4

## 2020-05-09 MED ORDER — POTASSIUM CHLORIDE IN NACL 20-0.9 MEQ/L-% IV SOLN
INTRAVENOUS | Status: DC
  Filled 2020-05-09: qty 1000

## 2020-05-09 MED ORDER — ONDANSETRON HCL 4 MG/2ML IJ SOLN: 4.0000 mg | Freq: Four times a day (QID) | INTRAMUSCULAR | Status: DC | PRN

## 2020-05-09 MED ORDER — NITROGLYCERIN 0.4 MG SL SUBL
0.4000 mg | SUBLINGUAL_TABLET | SUBLINGUAL | Status: DC | PRN
  Administered 2020-05-10 – 2020-05-11 (×3): 0.4 mg via SUBLINGUAL
  Filled 2020-05-09 (×3): qty 1

## 2020-05-09 MED ORDER — HEPARIN BOLUS VIA INFUSION
3700.0000 [IU] | Freq: Once | INTRAVENOUS | Status: AC
  Administered 2020-05-09: 3700 [IU] via INTRAVENOUS
  Filled 2020-05-09: qty 3700

## 2020-05-09 MED ORDER — HEPARIN (PORCINE) 25000 UT/250ML-% IV SOLN
1000.0000 [IU]/h | INTRAVENOUS | Status: DC
  Administered 2020-05-09 – 2020-05-10 (×2): 1000 [IU]/h via INTRAVENOUS
  Filled 2020-05-09 (×2): qty 250

## 2020-05-09 MED ORDER — TICAGRELOR 90 MG PO TABS
90.0000 mg | ORAL_TABLET | Freq: Two times a day (BID) | ORAL | Status: DC
  Administered 2020-05-09 – 2020-05-11 (×4): 90 mg via ORAL
  Filled 2020-05-09 (×4): qty 1

## 2020-05-09 MED ORDER — METOPROLOL SUCCINATE ER 25 MG PO TB24
25.0000 mg | ORAL_TABLET | Freq: Every day | ORAL | Status: DC
  Administered 2020-05-10 – 2020-05-11 (×2): 25 mg via ORAL
  Filled 2020-05-09 (×2): qty 1

## 2020-05-09 MED ORDER — ISOSORBIDE MONONITRATE ER 30 MG PO TB24
30.0000 mg | ORAL_TABLET | Freq: Every day | ORAL | Status: DC
  Filled 2020-05-09: qty 1

## 2020-05-09 MED ORDER — DIFLUPREDNATE 0.05 % OP EMUL: 1.0000 [drp] | Freq: Every day | OPHTHALMIC | Status: DC

## 2020-05-09 MED ORDER — CLOPIDOGREL BISULFATE 75 MG PO TABS: 75.0000 mg | ORAL_TABLET | Freq: Every day | ORAL | Status: DC

## 2020-05-09 MED ORDER — ALBUTEROL SULFATE (2.5 MG/3ML) 0.083% IN NEBU: 3.0000 mL | INHALATION_SOLUTION | RESPIRATORY_TRACT | Status: DC

## 2020-05-09 MED ORDER — ACETAMINOPHEN 325 MG PO TABS
650.0000 mg | ORAL_TABLET | ORAL | Status: DC | PRN
  Administered 2020-05-09: 650 mg via ORAL
  Filled 2020-05-09: qty 2

## 2020-05-09 NOTE — ED Triage Notes (Signed)
Pt BIB [facility] from home c/o of onset of CP that he rated 20/10. Pt stated it felt like pressure like an elephant was sitting on his chest. Pt admits he took 4 nitro tablets along with 2-3 sprays of nitro as well. When EMS arrived pt's pain was a 2/10 but his BP was 89/50. Pt received 500 NS bolus en route. Pt states the pain is radiating to his left arm and jaw. Pt had 2 stents placed on 7/30. Pt rates the CP at 4/10 currently. Pt denies any SHOB and wears 2L [place] at home.

## 2020-05-09 NOTE — ED Provider Notes (Signed)
[facility] Provider Note   CSN: [id number] Arrival date & time: 05/09/20  1748     History Chief Complaint  Patient presents with   Chest Pain    [name] [name] is a 69 y.o. male.  The history is provided by the patient.  Chest Pain Pain location:  Substernal area Pain quality: crushing and pressure   Pain radiates to:  L shoulder and L arm Pain severity:  Severe Onset quality:  Sudden Duration:  1 hour Timing:  Constant Progression:  Improving Chronicity:  Recurrent Relieved by:  Nitroglycerin Worsened by:  Nothing Ineffective treatments:  None tried Associated symptoms: shortness of breath   Associated symptoms: no abdominal pain, no back pain, no cough, no fever, no nausea, no palpitations and no vomiting   Risk factors: coronary artery disease        Past Medical History:  Diagnosis Date   Arthritis    Cataracts, bilateral    Hyperlipidemia    Hypertension    Seasonal allergies    Sickle cell trait (HCC)     Patient Active Problem List   Diagnosis Date Noted   Acute coronary syndrome (HCC) 03/28/2019    Past Surgical History:  Procedure Laterality Date   APPENDECTOMY     KNEE ARTHROSCOPY WITH ANTERIOR CRUCIATE LIGAMENT (ACL) REPAIR Left    STENT PLACEMENT VASCULAR ([facility] HX)         Family History  Problem Relation Age of Onset   Colon cancer Neg Hx    Esophageal cancer Neg Hx    Rectal cancer Neg Hx    Stomach cancer Neg Hx     Social History   Tobacco Use   Smoking status: Former Smoker    Quit date: 09/18/2008    Years since quitting: 11.6   Smokeless tobacco: Former NeurosurgeonUser    Types: Chew    Quit date: 09/18/1970  Vaping Use   Vaping Use: Never used  Substance Use Topics   Alcohol use: Yes    Comment: occas   Drug use: No    Home Medications Prior to Admission medications   Medication Sig Start Date End Date Taking? Authorizing Provider  albuterol (VENTOLIN HFA) 108  (90 Base) MCG/ACT inhaler Inhale 2 puffs into the lungs every 4 (four) hours as needed for wheezing or shortness of breath.  07/02/19  Yes [provider]  benzonatate (TESSALON) 100 MG capsule Take 1 capsule (100 mg total) by mouth 3 (three) times daily as needed for cough. 07/30/19  Yes Ward, Layla MawKristen N, DO  chlordiazePOXIDE (LIBRIUM) 25 MG capsule Take 25 mg by mouth at bedtime. 04/16/20  Yes [provider]  Difluprednate (DUREZOL) 0.05 % EMUL Place 1 drop into both eyes daily.   Yes [provider]  folic acid (FOLVITE) 1 MG tablet Take 1 mg by mouth daily. 04/07/20  Yes [provider]  furosemide (LASIX) 40 MG tablet Take 40 mg by mouth daily as needed for fluid or edema.  04/28/20  Yes [provider]  isosorbide mononitrate (IMDUR) 30 MG 24 hr tablet Take 30 mg by mouth daily. 04/06/20  Yes [provider]  lisinopril (ZESTRIL) 10 MG tablet Take 20 mg by mouth at bedtime.    Yes [provider]  Menthol, Topical Analgesic, (BIOFREEZE) 4 % GEL Apply 1 application topically 4 (four) times daily as needed (pain).    Yes [provider]  metoprolol succinate (TOPROL-XL) 25 MG 24 hr tablet Take 1  tablet (25 mg total) by mouth daily. 03/02/17  Yes Montez Morita, Monica, DO  nepafenac (ILEVRO) 0.3 % ophthalmic suspension Place 1 drop into both eyes at bedtime.   Yes [provider]  nitroGLYCERIN (NITROLINGUAL) 0.4 MG/SPRAY spray Place 1 spray under the tongue every 5 (five) minutes as needed for chest pain.    Yes [provider]  nitroGLYCERIN (NITROSTAT) 0.4 MG SL tablet Place 0.4 mg under the tongue every 5 (five) minutes as needed for chest pain.   Yes [provider]  oxyCODONE (OXY IR/ROXICODONE) 5 MG immediate release tablet Take 2.5 mg by mouth 2 (two) times daily. 05/07/20  Yes [provider]  pantoprazole (PROTONIX) 40 MG tablet Take 40 mg by mouth daily. 04/07/20  Yes [provider]    pramipexole (MIRAPEX) 0.125 MG tablet Take 0.125 mg by mouth at bedtime. 03/29/20  Yes [provider]  tamsulosin (FLOMAX) 0.4 MG CAPS capsule Take 0.8 mg by mouth at bedtime.  05/23/18  Yes [provider]  ticagrelor (BRILINTA) 90 MG TABS tablet Take 90 mg by mouth 2 (two) times daily.   Yes [provider]  clopidogrel (PLAVIX) 75 MG tablet Take 75 mg by mouth daily. Patient not taking: Reported on 05/09/2020 04/30/18   [provider]  vitamin C (ASCORBIC ACID) 500 MG tablet Take 500 mg by mouth daily. Patient not taking: Reported on 05/09/2020    [provider]    Allergies    Shellfish-derived products  Review of Systems   Review of Systems  Constitutional: Negative for chills and fever.  HENT: Negative for ear pain and sore throat.   Eyes: Negative for pain and visual disturbance.  Respiratory: Positive for shortness of breath. Negative for cough.   Cardiovascular: Positive for chest pain. Negative for palpitations.  Gastrointestinal: Negative for abdominal pain, nausea and vomiting.  Genitourinary: Negative for dysuria and hematuria.  Musculoskeletal: Negative for arthralgias and back pain.  Skin: Negative for color change and rash.  Neurological: Negative for seizures and syncope.  All other systems reviewed and are negative.   Physical Exam Updated Vital Signs BP 140/76    Pulse 71    Temp 98.3 F (36.8 C) (Oral)    Resp 15    Ht 5\' 11"  (1.803 m)    Wt 62.1 kg    SpO2 100%    BMI 19.09 kg/m   Physical Exam Vitals and nursing note reviewed.  Constitutional:      Appearance: He is well-developed.  HENT:     Head: Normocephalic and atraumatic.  Eyes:     Conjunctiva/sclera: Conjunctivae normal.  Cardiovascular:     Rate and Rhythm: Normal rate and regular rhythm.     Heart sounds: No murmur heard.   Pulmonary:     Effort: Pulmonary effort is normal. No respiratory distress.     Breath sounds: Normal breath sounds. No  decreased breath sounds, wheezing, rhonchi or rales.  Abdominal:     Palpations: Abdomen is soft.     Tenderness: There is no abdominal tenderness.  Musculoskeletal:     Cervical back: Neck supple.  Skin:    General: Skin is warm and dry.     Capillary Refill: Capillary refill takes less than 2 seconds.  Neurological:     General: No focal deficit present.     Mental Status: He is alert and oriented to person, place, and time.     Cranial Nerves: No cranial nerve deficit.     Motor:  No weakness.  Psychiatric:        Mood and Affect: Mood normal.        Behavior: Behavior normal.     ED Results / Procedures / Treatments   Labs (all labs ordered are listed, but only abnormal results are displayed) Labs Reviewed  COMPREHENSIVE METABOLIC PANEL - Abnormal; Notable for the following components:      Result Value   Potassium 2.8 (*)    BUN 6 (*)    Creatinine, Ser 1.50 (*)    AST 42 (*)    GFR calc non Af Amer 48 (*)    GFR calc Af Amer 55 (*)    All other components within normal limits  CBC - Abnormal; Notable for the following components:   WBC 11.0 (*)    RBC 3.83 (*)    Hemoglobin 11.0 (*)    HCT 34.5 (*)    All other components within normal limits  CBC - Abnormal; Notable for the following components:   RBC 3.48 (*)    Hemoglobin 9.8 (*)    HCT 30.9 (*)    All other components within normal limits  BASIC METABOLIC PANEL - Abnormal; Notable for the following components:   Potassium 3.1 (*)    Glucose, Bld 115 (*)    Creatinine, Ser 1.82 (*)    Calcium 8.6 (*)    GFR calc non Af Amer 38 (*)    GFR calc Af Amer 44 (*)    All other components within normal limits  TROPONIN I (HIGH SENSITIVITY) - Abnormal; Notable for the following components:   Troponin I (High Sensitivity) 115 (*)    All other components within normal limits  TROPONIN I (HIGH SENSITIVITY) - Abnormal; Notable for the following components:   Troponin I (High Sensitivity) 792 (*)    All other  components within normal limits  SARS CORONAVIRUS 2 BY RT PCR (HOSPITAL ORDER, PERFORMED IN [facility])  HEPARIN LEVEL (UNFRACTIONATED)  HIV ANTIBODY (ROUTINE TESTING W REFLEX)  PROTIME-INR  LIPID PANEL  BASIC METABOLIC PANEL  CBC  MAGNESIUM    EKG EKG Interpretation  Date/Time:  Sunday May 09 2020 17:53:42 EDT Ventricular Rate:  70 PR Interval:    QRS Duration: 102 QT Interval:  460 QTC Calculation: 497 R Axis:   74 Text Interpretation: Sinus rhythm Probable left atrial enlargement Borderline ST elevation, anterior leads Borderline prolonged QT interval Artifact in lead(s) I II aVR aVL aVF No significant change since last tracing Confirmed by Melene Plan [phone]) on 05/09/2020 5:59:21 PM Also confirmed by Melene Plan [phone]), editor Elita Quick ([id number])  on 05/10/2020 7:12:46 AM   Radiology DG Chest Portable 1 View  Result Date: 05/09/2020 CLINICAL DATA:  Chest pain EXAM: PORTABLE CHEST 1 VIEW COMPARISON:  04/29/2020 FINDINGS: Extreme left costophrenic angle is excluded from view. The visualized lungs are well expanded, are symmetric, and are clear. No pneumothorax or pleural effusion. Cardiac size within normal limits. Coronary artery stenting has been performed. Pulmonary vascularity is normal. No acute bone abnormality. IMPRESSION: 1. No acute cardiopulmonary disease. Electronically Signed   By: Helyn Numbers MD   On: 05/09/2020 18:43    Procedures Procedures (including critical care time)  Medications Ordered in ED Medications  heparin ADULT infusion 100 units/mL (25000 units/259mL sodium chloride 0.45%) (1,000 Units/hr Intravenous Rate/Dose Verify 05/09/20 2351)  aspirin EC tablet 81 mg (81 mg Oral Given 05/10/20 1002)  nitroGLYCERIN (NITROSTAT) SL tablet 0.4 mg (has no administration in time  range)  acetaminophen (TYLENOL) tablet 650 mg (650 mg Oral Given 05/09/20 2349)  ondansetron (ZOFRAN) injection 4 mg (has no administration in time range)    albuterol (PROVENTIL) (2.5 MG/3ML) 0.083% nebulizer solution 3 mL (has no administration in time range)  metoprolol succinate (TOPROL-XL) 24 hr tablet 25 mg (25 mg Oral Given 05/10/20 1002)  tamsulosin (FLOMAX) capsule 0.4 mg (0.4 mg Oral Given 05/10/20 1002)  potassium chloride (KLOR-CON) CR tablet 40 mEq (40 mEq Oral Given 05/10/20 1011)  ticagrelor (BRILINTA) tablet 90 mg (90 mg Oral Given 05/10/20 1003)  chlordiazePOXIDE (LIBRIUM) capsule 25 mg (25 mg Oral Given 05/09/20 2313)  Difluprednate 0.05 % EMUL 1 drop (has no administration in time range)  folic acid (FOLVITE) tablet 1 mg (1 mg Oral Given 05/10/20 1001)  nepafenac (ILEVRO) 0.3 % ophthalmic suspension 1 drop (1 drop Both Eyes Not Given 05/09/20 2340)  pantoprazole (PROTONIX) EC tablet 40 mg (40 mg Oral Given 05/10/20 1000)  pramipexole (MIRAPEX) tablet 0.125 mg (0.125 mg Oral Given 05/09/20 2348)  sodium chloride flush (NS) 0.9 % injection 3 mL (3 mLs Intravenous Not Given 05/10/20 1349)  nitroGLYCERIN 50 mg in dextrose 5 % 250 mL (0.2 mg/mL) infusion (0 mcg/min Intravenous Hold 05/10/20 1004)  atorvastatin (LIPITOR) tablet 80 mg (80 mg Oral Given 05/10/20 1001)  0.9 %  sodium chloride infusion ( Intravenous New Bag/Given 05/10/20 1012)  heparin bolus via infusion 3,700 Units (3,700 Units Intravenous Bolus from Bag 05/09/20 2149)    ED Course  I have reviewed the triage vital signs and the nursing notes.  Pertinent labs & imaging results that were available during my care of the patient were reviewed by me and considered in my medical decision making (see chart for details).  Clinical Course as of May 10 1452  Sun May 09, 2020  1755 EKG 12-Lead [CW]  1934 Trop 115.  C/f NSTEMI.  EKG stable.  Consulted Cards and agreed to eval pt.     [CW]  2054 Pt admitted in cards in stable condition w/o further events.   [CW]    Clinical Course User Index [CW] Rickey Primus, MD   MDM Rules/Calculators/A&P                          69 year old  male with history of coronary artery disease status post stent placement on 7/30 presents with chest pain.  Patient states that he was in the grocery store today walking around when he had 20 out of 10 substernal chest pain that radiated into his left arm.  Patient states that he took 3-4 sublingual nitros as well as 2 to 3 sprays of nitroglycerin with improvement of his pain.  EMS was called and upon EMS arrival patient was hypotensive with systolic blood pressure in the 80s.  Patient received 500 cc with EMS and is currently rating the pain for a 10.  States that he had shortness of breath with the chest pain however this has since resolved.  Denies fever, chills abdominal pain, nausea, vomiting, diaphoresis.  Received 324 aspirin with EMS.  States that he was started on Brilinta after his stents and states that he has been compliant with this regimen.  Afebrile vital signs stable.  Exam as above.  No focal abnormalities on physical exam.  EKG showed no acute changes from previous ECGs and does not appear ischemic at this time.  Will obtain cardiac labs and reassess the patient.  Clinical Course as of  May 11 1451  Sun May 09, 2020  1755 EKG 12-Lead [CW]  1934 Trop 115.  C/f NSTEMI esp w/ continuing cp.  Heparin gtt started.  EKG stable.  Consulted Cards and agreed to eval pt.     [CW]  2054 Pt admitted in cards in stable condition w/o further events.   [CW]    Clinical Course User Index [CW] Rickey Primus, MD    Final Clinical Impression(s) / ED Diagnoses Final diagnoses:  NSTEMI (non-ST elevated myocardial infarction) [facility]    Rx / DC Orders ED Discharge Orders    None       Rickey Primus, MD 05/10/20 1454    Melene Plan, DO 05/12/20 [phone]

## 2020-05-09 NOTE — H&P (Addendum)
Referring Physician:  CECILIO OHLRICH is an 69 y.o. male.                       Chief Complaint: Chest pain  HPI: 69 years old black male with recent MI and 3 LAD stents placed less than 2 months ago has 20/20 Pressure like chest pain. He took 4 SL NTG along with 2-3 sprays of Nitroglycerin resulting in hypotension and getting 500 cc NS bolus by [facility]. The chest pain radiated to left arm and jaw. He still has 2/10 chest pain. He has PMH of hypertension, hyperlipidemia, sickle cell trait and arthritis. EKG shows sinus rhythm, Left atrial enlargement and evidence of old Anteroseptal MI. Chest x-ray is unremarkable. Initial troponin I is 115 ng/L. Potassium is low at 2.8 mmol/L and creainine is elevated at 1.5 mg.   Past Medical History:  Diagnosis Date  . Arthritis   . Cataracts, bilateral   . Hyperlipidemia   . Hypertension   . Seasonal allergies   . Sickle cell trait [facility])       Past Surgical History:  Procedure Laterality Date  . APPENDECTOMY    . KNEE ARTHROSCOPY WITH ANTERIOR CRUCIATE LIGAMENT (ACL) REPAIR Left   . STENT PLACEMENT VASCULAR ([facility] HX)      Family History  Problem Relation Age of Onset  . Colon cancer Neg Hx   . Esophageal cancer Neg Hx   . Rectal cancer Neg Hx   . Stomach cancer Neg Hx    Social History:  reports that he quit smoking about 11 years ago. He quit smokeless tobacco use about 49 years ago.  His smokeless tobacco use included chew. He reports current alcohol use. He reports that he does not use drugs.  Allergies:  Allergies  Allergen Reactions  . Shellfish-Derived Products Anaphylaxis, Shortness Of Breath and Other (See Comments)    "My throat goes numb"    (Not in a hospital admission)   Results for orders placed or performed during the hospital encounter of 05/09/20 (from the past 48 hour(s))  Comprehensive metabolic panel     Status: Abnormal   Collection Time: 05/09/20  6:24 PM  Result Value Ref Range   Sodium 141 135 - 145 mmol/L    Potassium 2.8 (L) 3.5 - 5.1 mmol/L   Chloride 103 98 - 111 mmol/L   CO2 25 22 - 32 mmol/L   Glucose, Bld 90 70 - 99 mg/dL    Comment: Glucose reference range applies only to samples taken after fasting for at least 8 hours.   BUN 6 (L) 8 - 23 mg/dL   Creatinine, Ser 8.29 (H) 0.61 - 1.24 mg/dL   Calcium 8.9 8.9 - 93.7 mg/dL   Total Protein 6.9 6.5 - 8.1 g/dL   Albumin 3.6 3.5 - 5.0 g/dL   AST 42 (H) 15 - 41 U/L   ALT 30 0 - 44 U/L   Alkaline Phosphatase 92 38 - 126 U/L   Total Bilirubin 0.9 0.3 - 1.2 mg/dL   GFR calc non Af Amer 48 (L) >60 mL/min   GFR calc Af Amer 55 (L) >60 mL/min   Anion gap 13 5 - 15    Comment: Performed at [facility], [address], [place], [place] [address]  CBC     Status: Abnormal   Collection Time: 05/09/20  6:24 PM  Result Value Ref Range   WBC 11.0 (H) 4.0 - 10.5 K/uL   RBC  3.83 (L) 4.22 - 5.81 MIL/uL   Hemoglobin 11.0 (L) 13.0 - 17.0 g/dL   HCT 35.4 (L) 39 - 52 %   MCV 90.1 80.0 - 100.0 fL   MCH 28.7 26.0 - 34.0 pg   MCHC 31.9 30.0 - 36.0 g/dL   RDW 65.6 81.2 - 75.1 %   Platelets 201 150 - 400 K/uL   nRBC 0.0 0.0 - 0.2 %    Comment: Performed at [facility], [address] [place], [place] [address]  Troponin I (High Sensitivity)     Status: Abnormal   Collection Time: 05/09/20  6:24 PM  Result Value Ref Range   Troponin I (High Sensitivity) 115 (HH) <18 ng/L    Comment: CRITICAL RESULT CALLED TO, READ BACK BY AND VERIFIED WITH: RN B BECK AT 1917 05/09/20 BY L BENFIELD (NOTE) Elevated high sensitivity troponin I (hsTnI) values and significant  changes across serial measurements may suggest ACS but many other  chronic and acute conditions are known to elevate hsTnI results.  Refer to the Links section for chest pain algorithms and additional  guidance. Performed at [facility], [address] [place], [place] [address]    DG Chest Portable 1 View  Result Date: 05/09/2020 CLINICAL DATA:  Chest pain EXAM: PORTABLE  CHEST 1 VIEW COMPARISON:  04/29/2020 FINDINGS: Extreme left costophrenic angle is excluded from view. The visualized lungs are well expanded, are symmetric, and are clear. No pneumothorax or pleural effusion. Cardiac size within normal limits. Coronary artery stenting has been performed. Pulmonary vascularity is normal. No acute bone abnormality. IMPRESSION: 1. No acute cardiopulmonary disease. Electronically Signed   By: Helyn Numbers MD   On: 05/09/2020 18:43    Review Of Systems Constitutional: No fever, chills, weight loss or gain. Eyes: No vision change, wears glasses. No discharge or pain. Ears: No hearing loss, No tinnitus. Respiratory: No asthma, COPD, pneumonias. Positive shortness of breath. No hemoptysis. Cardiovascular: Positive chest pain, palpitation, leg edema. Gastrointestinal: No nausea, vomiting, diarrhea, constipation. No GI bleed. No hepatitis. Genitourinary: No dysuria, hematuria, kidney stone. No incontinance. Neurological: No headache, stroke, seizures.  Psychiatry: No psych facility admission for anxiety, depression, suicide. No detox. Skin: No rash. Musculoskeletal: Positive joint pain, neck pain, back pain. Lymphadenopathy: No lymphadenopathy. Hematology: Positive anemia or easy bruising.   Blood pressure (!) 113/50, pulse 81, temperature 98 F (36.7 C), temperature source Oral, resp. rate 20, height 5\' 11"  (1.803 m), weight 62.1 kg, SpO2 100 %. Body mass index is 19.09 kg/m. General appearance: alert, cooperative, appears stated age and mild distress Head: Normocephalic, atraumatic. Eyes: Brown eyes, pink conjunctiva, corneas clear. PERRL, EOM's intact. Neck: No adenopathy, no carotid bruit, no JVD, supple, symmetrical, trachea midline and thyroid not enlarged. Resp: basal crackles to auscultation bilaterally. Cardio: Regular rate and rhythm, S1, S2 normal, II/VI systolic murmur, no click, rub or gallop GI: Soft, non-tender; bowel sounds normal; no  organomegaly. Extremities: No edema, cyanosis or clubbing. Skin: Warm and dry.  Neurologic: Alert and oriented X 3, normal strength.  Assessment/Plan Acute coronary syndrome CAD Recent 3 LAD stents, 3 mm x 15 mm Resolute Onyx DES in Proximal LAD 2.5 mm x 8 mm and 15 mm Resolute Onyx DES in distal LAD HTN Hyperlipidemia H/O remote tobacco use disorder Hypokalemia Acute renal failure  Admit. Potassium and fluid supplement as tolerated. Home medications + IV heparin + Plavix- Change to Brilinta if has stent thrombosis. Patient already on Brilinta, Plavix was on old  list of medications. Medications reconciliation done.  Keep NPO after mid-night for possible cardiac catheterization.  Time spent: Review of old records, Lab, x-rays, EKG, other cardiac tests, examination, discussion with patient over 70 minutes.  Ricki Rodriguez, MD  05/09/2020, 8:57 PM

## 2020-05-09 NOTE — Progress Notes (Signed)
ANTICOAGULATION CONSULT NOTE - Initial Consult  Pharmacy Consult for heparin Indication: chest pain/ACS  Allergies  Allergen Reactions  . Shellfish-Derived Products Anaphylaxis, Shortness Of Breath and Other (See Comments)    "My throat goes numb"    Patient Measurements: Height: 5\' 11"  (180.3 cm) Weight: 62.1 kg (136 lb 14.5 oz) IBW/kg (Calculated) : 75.3 Heparin Dosing Weight: 62.1kg  Vital Signs: Temp: 98 F (36.7 C) (08/22 1754) Temp Source: Oral (08/22 1754) BP: 113/50 (08/22 1754) Pulse Rate: 81 (08/22 1754)  Labs: Recent Labs    05/09/20 1824  HGB 11.0*  HCT 34.5*  PLT 201  CREATININE 1.50*  TROPONINIHS 115*    Estimated Creatinine Clearance: 42.6 mL/min (A) (by C-G formula based on SCr of 1.5 mg/dL (H)).   Medical History: Past Medical History:  Diagnosis Date  . Arthritis   . Cataracts, bilateral   . Hyperlipidemia   . Hypertension   . Seasonal allergies   . Sickle cell trait (HCC)     Medications:  Infusions:  . heparin      Assessment: 68 yom presented to the ED with CP. Troponin elevated and now starting IV heparin. Baseline Hgb is slightly low at 11 and platelets are WNL. He is not on anticoagulation PTA.   Goal of Therapy:  Heparin level 0.3-0.7 units/ml Monitor platelets by anticoagulation protocol: Yes   Plan:  Heparin bolus 3700 units IV x 1 Heparin gtt 1000 units/hr Check a 6 hr heparin level Daily heparin level and CBC  [name] [name], 05/11/20 05/09/2020,7:26 PM

## 2020-05-10 LAB — CBC
HCT: 30.9 % — ABNORMAL LOW (ref 39.0–52.0)
HCT: 31.5 % — ABNORMAL LOW (ref 39.0–52.0)
HCT: 30.8 % — ABNORMAL LOW (ref 39.0–52.0)
Hemoglobin: 9.8 g/dL — ABNORMAL LOW (ref 13.0–17.0)
Hemoglobin: 9.9 g/dL — ABNORMAL LOW (ref 13.0–17.0)
Hemoglobin: 9.7 g/dL — ABNORMAL LOW (ref 13.0–17.0)
MCH: 28.2 pg (ref 26.0–34.0)
MCH: 28.1 pg (ref 26.0–34.0)
MCH: 28.7 pg (ref 26.0–34.0)
MCHC: 31.7 g/dL (ref 30.0–36.0)
MCHC: 31.4 g/dL (ref 30.0–36.0)
MCHC: 31.5 g/dL (ref 30.0–36.0)
MCV: 88.8 fL (ref 80.0–100.0)
MCV: 89.5 fL (ref 80.0–100.0)
MCV: 91.1 fL (ref 80.0–100.0)
Platelets: 181 10*3/uL (ref 150–400)
Platelets: 168 10*3/uL (ref 150–400)
Platelets: 171 10*3/uL (ref 150–400)
RBC: 3.48 MIL/uL — ABNORMAL LOW (ref 4.22–5.81)
RBC: 3.52 MIL/uL — ABNORMAL LOW (ref 4.22–5.81)
RBC: 3.38 MIL/uL — ABNORMAL LOW (ref 4.22–5.81)
RDW: 13.1 % (ref 11.5–15.5)
RDW: 13.1 % (ref 11.5–15.5)
RDW: 13 % (ref 11.5–15.5)
WBC: 6.6 10*3/uL (ref 4.0–10.5)
WBC: 5.6 10*3/uL (ref 4.0–10.5)
WBC: 5.4 10*3/uL (ref 4.0–10.5)
nRBC: 0 % (ref 0.0–0.2)
nRBC: 0 % (ref 0.0–0.2)
nRBC: 0 % (ref 0.0–0.2)

## 2020-05-10 LAB — BASIC METABOLIC PANEL
Anion gap: 11 (ref 5–15)
Anion gap: 8 (ref 5–15)
Anion gap: 8 (ref 5–15)
BUN: 12 mg/dL (ref 8–23)
BUN: 8 mg/dL (ref 8–23)
BUN: 7 mg/dL — ABNORMAL LOW (ref 8–23)
CO2: 28 mmol/L (ref 22–32)
CO2: 28 mmol/L (ref 22–32)
CO2: 26 mmol/L (ref 22–32)
Calcium: 8.6 mg/dL — ABNORMAL LOW (ref 8.9–10.3)
Calcium: 8.6 mg/dL — ABNORMAL LOW (ref 8.9–10.3)
Calcium: 8.2 mg/dL — ABNORMAL LOW (ref 8.9–10.3)
Chloride: 101 mmol/L (ref 98–111)
Chloride: 105 mmol/L (ref 98–111)
Chloride: 105 mmol/L (ref 98–111)
Creatinine, Ser: 1.82 mg/dL — ABNORMAL HIGH (ref 0.61–1.24)
Creatinine, Ser: 0.91 mg/dL (ref 0.61–1.24)
Creatinine, Ser: 0.81 mg/dL (ref 0.61–1.24)
GFR calc Af Amer: 44 mL/min — ABNORMAL LOW (ref >60)
GFR calc Af Amer: >60 mL/min (ref >60)
GFR calc Af Amer: >60 mL/min (ref >60)
GFR calc non Af Amer: 38 mL/min — ABNORMAL LOW (ref >60)
GFR calc non Af Amer: >60 mL/min (ref >60)
GFR calc non Af Amer: >60 mL/min (ref >60)
Glucose, Bld: 115 mg/dL — ABNORMAL HIGH (ref 70–99)
Glucose, Bld: 101 mg/dL — ABNORMAL HIGH (ref 70–99)
Glucose, Bld: 106 mg/dL — ABNORMAL HIGH (ref 70–99)
Potassium: 3.1 mmol/L — ABNORMAL LOW (ref 3.5–5.1)
Potassium: 3.9 mmol/L (ref 3.5–5.1)
Potassium: 4.1 mmol/L (ref 3.5–5.1)
Sodium: 140 mmol/L (ref 135–145)
Sodium: 141 mmol/L (ref 135–145)
Sodium: 139 mmol/L (ref 135–145)

## 2020-05-10 LAB — LIPID PANEL
Cholesterol: 102 mg/dL (ref 0–200)
HDL: 62 mg/dL (ref >40)
LDL Cholesterol: 26 mg/dL (ref 0–99)
Total CHOL/HDL Ratio: 1.6 RATIO
Triglycerides: 70 mg/dL (ref <150)
VLDL: 14 mg/dL (ref 0–40)

## 2020-05-10 LAB — HIV ANTIBODY (ROUTINE TESTING W REFLEX): HIV Screen 4th Generation wRfx: NONREACTIVE

## 2020-05-10 LAB — PROTIME-INR
INR: 1.1 (ref 0.8–1.2)
Prothrombin Time: 13.9 seconds (ref 11.4–15.2)

## 2020-05-10 LAB — HEPARIN LEVEL (UNFRACTIONATED): Heparin Unfractionated: 0.45 IU/mL (ref 0.30–0.70)

## 2020-05-10 LAB — MAGNESIUM: Magnesium: 1.5 mg/dL — ABNORMAL LOW (ref 1.7–2.4)

## 2020-05-10 MED ORDER — SODIUM CHLORIDE 0.9 % WEIGHT BASED INFUSION
3.0000 mL/kg/h | INTRAVENOUS | Status: AC
  Administered 2020-05-11: 3 mL/kg/h via INTRAVENOUS

## 2020-05-10 MED ORDER — SODIUM CHLORIDE 0.9 % IV SOLN: 250.0000 mL | INTRAVENOUS | Status: DC | PRN

## 2020-05-10 MED ORDER — ATORVASTATIN CALCIUM 80 MG PO TABS
80.0000 mg | ORAL_TABLET | Freq: Every day | ORAL | Status: DC
  Administered 2020-05-10 – 2020-05-11 (×2): 80 mg via ORAL
  Filled 2020-05-10: qty 1

## 2020-05-10 MED ORDER — SODIUM CHLORIDE 0.9 % IV SOLN: INTRAVENOUS | Status: DC

## 2020-05-10 MED ORDER — SODIUM CHLORIDE 0.9% FLUSH
3.0000 mL | Freq: Two times a day (BID) | INTRAVENOUS | Status: DC
  Administered 2020-05-10 – 2020-05-11 (×2): 3 mL via INTRAVENOUS

## 2020-05-10 MED ORDER — SODIUM CHLORIDE 0.9% FLUSH: 3.0000 mL | INTRAVENOUS | Status: DC | PRN

## 2020-05-10 MED ORDER — SODIUM CHLORIDE 0.9 % WEIGHT BASED INFUSION
1.0000 mL/kg/h | INTRAVENOUS | Status: DC
  Administered 2020-05-11: 1 mL/kg/h via INTRAVENOUS

## 2020-05-10 MED ORDER — ASPIRIN 81 MG PO CHEW
81.0000 mg | CHEWABLE_TABLET | ORAL | Status: AC
  Administered 2020-05-11: 81 mg via ORAL
  Filled 2020-05-10: qty 1

## 2020-05-10 MED ORDER — NITROGLYCERIN IN D5W 200-5 MCG/ML-% IV SOLN
5.0000 ug/min | INTRAVENOUS | Status: DC
  Administered 2020-05-11: 5 ug/min via INTRAVENOUS
  Filled 2020-05-10: qty 250

## 2020-05-10 NOTE — ED Notes (Signed)
Pharmacy verify with primary care provider per [facility].

## 2020-05-10 NOTE — ED Notes (Signed)
Cardiologist at bedside.  

## 2020-05-10 NOTE — Progress Notes (Signed)
Pt complained of 10/10 chest pain. Relieved with 1 dose of NTG. BP 164/95 HR 72. EKG done. Cardiology notified. Pt resting comfortably in bed, no complains at this time. Will continue to monitor

## 2020-05-10 NOTE — ED Notes (Signed)
Ambulatory to restroom for BM. No pain. Returned to room and back on monitor

## 2020-05-10 NOTE — ED Notes (Signed)
Water pitcher provided for pt comfort.

## 2020-05-10 NOTE — ED Notes (Addendum)
RN paged MD after pt continued to requestCardiologist came and talked to pt about staying. He agreed to stay. Cath planned for tomorrow.

## 2020-05-10 NOTE — ED Notes (Signed)
Pt satisfied with pain relief provided by nitro.

## 2020-05-10 NOTE — Progress Notes (Signed)
ANTICOAGULATION CONSULT NOTE   Pharmacy Consult for heparin Indication: chest pain/ACS  Allergies  Allergen Reactions  . Shellfish-Derived Products Anaphylaxis, Shortness Of Breath and Other (See Comments)    "My throat goes numb"    Patient Measurements: Height: 5\' 11"  (180.3 cm) Weight: 62.1 kg (136 lb 14.5 oz) IBW/kg (Calculated) : 75.3 Heparin Dosing Weight: 62.1kg  Vital Signs: Temp: 98 F (36.7 C) (08/22 1754) Temp Source: Oral (08/22 1754) BP: 98/51 (08/23 0300) Pulse Rate: 78 (08/23 0300)  Labs: Recent Labs    05/09/20 1824 05/09/20 2209 05/10/20 0344  HGB 11.0*  --  9.8*  HCT 34.5*  --  30.9*  PLT 201  --  181  HEPARINUNFRC  --   --  0.45  CREATININE 1.50*  --   --   TROPONINIHS 115* 792*  --     Estimated Creatinine Clearance: 42.6 mL/min (A) (by C-G formula based on SCr of 1.5 mg/dL (H)).  Assessment: 52 yom presented to the ED with CP. Troponin elevated and now starting IV heparin. Baseline Hgb is slightly low at 11 and platelets are WNL. He is not on anticoagulation PTA.   Heparin level therapeutic (0.45) on gtt at 1000 units/hr. No bleeding noted. Possible cath today  Goal of Therapy:  Heparin level 0.3-0.7 units/ml Monitor platelets by anticoagulation protocol: Yes   Plan:  Heparin gtt 1000 units/hr F/u cardiac w/u plans  73, PharmD, BCPS Please see amion for complete clinical pharmacist phone list 05/10/2020,4:28 AM

## 2020-05-10 NOTE — ED Notes (Signed)
Pt called doctors office from bedside phone had RN speak to [facility] CMA. Per CMA at doctors office pt takes 5 mg Oxycodone PRN q6h for pain. Pt requests RN to give him this medication. Pt informed process of getting medication ordered. Secure message and paged admitting.

## 2020-05-10 NOTE — Progress Notes (Signed)
Subjective:  Patient denies any further chest pain.  States overall feels better.  Renal function progressively getting worst.  Also had significant drop in hemoglobin.  Potassium being replaced.  Objective:  Vital Signs in the last 24 hours: Temp:  [98 F (36.7 C)-98.3 F (36.8 C)] 98.3 F (36.8 C) (08/23 0912) Pulse Rate:  [59-81] 59 (08/23 0912) Resp:  [15-21] 15 (08/23 0912) BP: (98-139)/(50-84) 139/83 (08/23 0912) SpO2:  [97 %-100 %] 100 % (08/23 0912) Weight:  [62.1 kg] 62.1 kg (08/22 1900)  Intake/Output from previous day: No intake/output data recorded. Intake/Output from this shift: Total I/O In: -  Out: 400 [Urine:400]  Physical Exam: Neck: no adenopathy, no carotid bruit, no JVD and supple, symmetrical, trachea midline Lungs: clear to auscultation bilaterally Heart: regular rate and rhythm, S1, S2 normal and 2/6 systolic murmur noted Abdomen: soft, non-tender; bowel sounds normal; no masses,  no organomegaly Extremities: extremities normal, atraumatic, no cyanosis or edema  Lab Results: Recent Labs    05/09/20 1824 05/10/20 0344  WBC 11.0* 6.6  HGB 11.0* 9.8*  PLT 201 181   Recent Labs    05/09/20 1824 05/10/20 0344  NA 141 140  K 2.8* 3.1*  CL 103 101  CO2 25 28  GLUCOSE 90 115*  BUN 6* 12  CREATININE 1.50* 1.82*   No results for input(s): TROPONINI in the last 72 hours.  Invalid input(s): CK, MB Hepatic Function Panel Recent Labs    05/09/20 1824  PROT 6.9  ALBUMIN 3.6  AST 42*  ALT 30  ALKPHOS 92  BILITOT 0.9   Recent Labs    05/10/20 0344  CHOL 102   No results for input(s): PROTIME in the last 72 hours.  Imaging: Imaging results have been reviewed and DG Chest Portable 1 View  Result Date: 05/09/2020 CLINICAL DATA:  Chest pain EXAM: PORTABLE CHEST 1 VIEW COMPARISON:  04/29/2020 FINDINGS: Extreme left costophrenic angle is excluded from view. The visualized lungs are well expanded, are symmetric, and are clear. No pneumothorax  or pleural effusion. Cardiac size within normal limits. Coronary artery stenting has been performed. Pulmonary vascularity is normal. No acute bone abnormality. IMPRESSION: 1. No acute cardiopulmonary disease. Electronically Signed   By: Helyn Numbers MD   On: 05/09/2020 18:43    Cardiac Studies:  Assessment/Plan:  Acute coronary syndrome CAD Recent 3 LAD stents, 3 mm x 15 mm Resolute Onyx DES in Proximal LAD 2.5 mm x 8 mm and 15 mm Resolute Onyx DES in distal LAD HTN Hyperlipidemia H/O remote tobacco use disorder Hypokalemia Acute renal injury secondary to hypotension/ACE inhibitor's Acute on chronic anemia, rule out GI loss. Plan Discussed with patient at length regarding mildly elevated high sensitivity troponin high and worsening renal function and left cardiac catheterization, possible PTCA stenting.  This risk and benefits, I.e., death, MI, stroke, need for emergency CABG, local vascular complications.  Worsening renal function versus waiting for one more day for hydration and replacing electrolytes and monitoring renal function prior to consideration of cardiac catheterization and agrees, we will consider emergent cardiac catheterization.  If he develops recurrent chest pain or EKG changes. Check labs in a.m. check stool for occult blood. We will reschedule cardiac catheterization possible PTCA for tomorrow.  LOS: 1 day    Rinaldo Cloud 05/10/2020, 9:41 AM

## 2020-05-11 ENCOUNTER — Encounter (HOSPITAL_COMMUNITY)
Admission: EM | Disposition: A | Discharge status: Home / Self Care | Payer: Self-pay | Source: Home / Self Care | Attending: Cardiovascular Disease

## 2020-05-11 ENCOUNTER — Encounter (HOSPITAL_COMMUNITY): Payer: Self-pay | Admitting: Cardiology

## 2020-05-11 HISTORY — PX: LEFT HEART CATH AND CORONARY ANGIOGRAPHY: CATH118249

## 2020-05-11 LAB — BASIC METABOLIC PANEL
Anion gap: 8 (ref 5–15)
BUN: <5 mg/dL — ABNORMAL LOW (ref 8–23)
CO2: 23 mmol/L (ref 22–32)
Calcium: 8.6 mg/dL — ABNORMAL LOW (ref 8.9–10.3)
Chloride: 109 mmol/L (ref 98–111)
Creatinine, Ser: 0.73 mg/dL (ref 0.61–1.24)
GFR calc Af Amer: >60 mL/min (ref >60)
GFR calc non Af Amer: >60 mL/min (ref >60)
Glucose, Bld: 104 mg/dL — ABNORMAL HIGH (ref 70–99)
Potassium: 4.3 mmol/L (ref 3.5–5.1)
Sodium: 140 mmol/L (ref 135–145)

## 2020-05-11 LAB — CBC
HCT: 31.3 % — ABNORMAL LOW (ref 39.0–52.0)
Hemoglobin: 9.9 g/dL — ABNORMAL LOW (ref 13.0–17.0)
MCH: 28.9 pg (ref 26.0–34.0)
MCHC: 31.6 g/dL (ref 30.0–36.0)
MCV: 91.5 fL (ref 80.0–100.0)
Platelets: 157 10*3/uL (ref 150–400)
RBC: 3.42 MIL/uL — ABNORMAL LOW (ref 4.22–5.81)
RDW: 13 % (ref 11.5–15.5)
WBC: 6.8 10*3/uL (ref 4.0–10.5)
nRBC: 0 % (ref 0.0–0.2)

## 2020-05-11 LAB — HEPARIN LEVEL (UNFRACTIONATED): Heparin Unfractionated: 0.36 IU/mL (ref 0.30–0.70)

## 2020-05-11 LAB — POCT ACTIVATED CLOTTING TIME: Activated Clotting Time: 114 seconds

## 2020-05-11 SURGERY — LEFT HEART CATH AND CORONARY ANGIOGRAPHY
Anesthesia: LOCAL

## 2020-05-11 MED ORDER — SODIUM CHLORIDE 0.9% FLUSH: 3.0000 mL | Freq: Two times a day (BID) | INTRAVENOUS | Status: DC

## 2020-05-11 MED ORDER — LIDOCAINE HCL (PF) 1 % IJ SOLN
INTRAMUSCULAR | Status: DC | PRN
  Administered 2020-05-11: 20 mL

## 2020-05-11 MED ORDER — SODIUM CHLORIDE 0.9 % IV SOLN: 250.0000 mL | INTRAVENOUS | Status: DC | PRN

## 2020-05-11 MED ORDER — ACETAMINOPHEN 325 MG PO TABS
650.0000 mg | ORAL_TABLET | ORAL | Status: DC | PRN
  Administered 2020-05-11: 650 mg via ORAL
  Filled 2020-05-11: qty 2

## 2020-05-11 MED ORDER — ACETAMINOPHEN 325 MG PO TABS: 650.0000 mg | ORAL_TABLET | ORAL | 1 refills | Status: DC | PRN

## 2020-05-11 MED ORDER — HEPARIN (PORCINE) IN NACL 1000-0.9 UT/500ML-% IV SOLN
INTRAVENOUS | Status: AC
  Filled 2020-05-11: qty 500

## 2020-05-11 MED ORDER — FENTANYL CITRATE (PF) 100 MCG/2ML IJ SOLN
INTRAMUSCULAR | Status: AC
  Filled 2020-05-11: qty 2

## 2020-05-11 MED ORDER — ATORVASTATIN CALCIUM 80 MG PO TABS: 80.0000 mg | ORAL_TABLET | Freq: Every day | ORAL | 3 refills | Status: DC

## 2020-05-11 MED ORDER — HYDRALAZINE HCL 20 MG/ML IJ SOLN
INTRAMUSCULAR | Status: AC
  Filled 2020-05-11: qty 1

## 2020-05-11 MED ORDER — ASPIRIN 81 MG PO TBEC
81.0000 mg | DELAYED_RELEASE_TABLET | Freq: Every day | ORAL | 11 refills | Status: DC
Start: 2020-05-12 — End: 2022-11-16

## 2020-05-11 MED ORDER — MIDAZOLAM HCL 2 MG/2ML IJ SOLN
INTRAMUSCULAR | Status: AC
  Filled 2020-05-11: qty 2

## 2020-05-11 MED ORDER — FENTANYL CITRATE (PF) 100 MCG/2ML IJ SOLN
INTRAMUSCULAR | Status: DC | PRN
Start: 2020-05-11 — End: 2020-05-11
  Administered 2020-05-11: 25 ug via INTRAVENOUS

## 2020-05-11 MED ORDER — HEPARIN (PORCINE) IN NACL 1000-0.9 UT/500ML-% IV SOLN
INTRAVENOUS | Status: DC | PRN
  Administered 2020-05-11 (×2): 500 mL

## 2020-05-11 MED ORDER — AMLODIPINE BESYLATE 5 MG PO TABS
5.0000 mg | ORAL_TABLET | Freq: Every day | ORAL | 3 refills | Status: DC
Start: 2020-05-12 — End: 2021-03-27

## 2020-05-11 MED ORDER — HYDRALAZINE HCL 20 MG/ML IJ SOLN
INTRAMUSCULAR | Status: DC | PRN
  Administered 2020-05-11: 10 mg via INTRAVENOUS

## 2020-05-11 MED ORDER — NITROGLYCERIN 2 % TD OINT
1.0000 [in_us] | TOPICAL_OINTMENT | Freq: Four times a day (QID) | TRANSDERMAL | Status: DC
  Filled 2020-05-11 (×2): qty 30

## 2020-05-11 MED ORDER — IOHEXOL 350 MG/ML SOLN
INTRAVENOUS | Status: DC | PRN
  Administered 2020-05-11: 80 mL

## 2020-05-11 MED ORDER — LIDOCAINE HCL (PF) 1 % IJ SOLN
INTRAMUSCULAR | Status: AC
  Filled 2020-05-11: qty 30

## 2020-05-11 MED ORDER — SODIUM CHLORIDE 0.9 % WEIGHT BASED INFUSION: 1.0000 mL/kg/h | INTRAVENOUS | Status: DC

## 2020-05-11 MED ORDER — ONDANSETRON HCL 4 MG/2ML IJ SOLN: 4.0000 mg | Freq: Four times a day (QID) | INTRAMUSCULAR | Status: DC | PRN

## 2020-05-11 MED ORDER — SODIUM CHLORIDE 0.9% FLUSH: 3.0000 mL | INTRAVENOUS | Status: DC | PRN

## 2020-05-11 MED ORDER — MIDAZOLAM HCL 2 MG/2ML IJ SOLN
INTRAMUSCULAR | Status: DC | PRN
  Administered 2020-05-11: 1 mg via INTRAVENOUS

## 2020-05-11 MED ORDER — AMLODIPINE BESYLATE 5 MG PO TABS
5.0000 mg | ORAL_TABLET | Freq: Every day | ORAL | Status: DC
  Administered 2020-05-11: 5 mg via ORAL
  Filled 2020-05-11: qty 1

## 2020-05-11 SURGICAL SUPPLY — 7 items
CATH INFINITI 5FR MULTPACK ANG (CATHETERS) ×1 IMPLANT
KIT HEART LEFT (KITS) ×2 IMPLANT
PACK CARDIAC CATHETERIZATION (CUSTOM PROCEDURE TRAY) ×2 IMPLANT
SHEATH PINNACLE 5F 10CM (SHEATH) ×1 IMPLANT
SYR MEDRAD MARK 7 150ML (SYRINGE) ×2 IMPLANT
TRANSDUCER W/STOPCOCK (MISCELLANEOUS) ×2 IMPLANT
WIRE EMERALD 3MM-J .035X150CM (WIRE) ×1 IMPLANT

## 2020-05-11 NOTE — Progress Notes (Signed)
Dr. Marni Griffon responded. Nitro paste ordered. 1in every 6 hours. He stated he will be rounding shortly.

## 2020-05-11 NOTE — Progress Notes (Signed)
Subjective:  Patient complained of chest pain earlier rated 1/10 received 1 sublingual nitro with relief presently denies any chest pain or shortness of breath states overall feels well.  Objective:  Vital Signs in the last 24 hours: Temp:  [97.4 F (36.3 C)-98.9 F (37.2 C)] 98.4 F (36.9 C) (08/24 0832) Pulse Rate:  [58-72] 62 (08/24 0920) Resp:  [14-19] 14 (08/24 0832) BP: (123-176)/(63-109) 169/109 (08/24 0920) SpO2:  [100 %] 100 % (08/24 0832) Weight:  [65.5 kg-66.2 kg] 65.5 kg (08/24 0543)  Intake/Output from previous day: 08/23 0701 - 08/24 0700 In: 3043.3 [P.O.:942; I.V.:2101.3] Out: 1750 [Urine:1750] Intake/Output from this shift: Total I/O In: 240 [P.O.:240] Out: 225 [Urine:225]  Physical Exam: Neck: no adenopathy, no carotid bruit, no JVD and supple, symmetrical, trachea midline Lungs: clear to auscultation bilaterally Heart: regular rate and rhythm, S1, S2 normal and Soft systolic murmur noted Abdomen: soft, non-tender; bowel sounds normal; no masses,  no organomegaly Extremities: extremities normal, atraumatic, no cyanosis or edema  Lab Results: Recent Labs    05/10/20 2126 05/11/20 0648  WBC 5.4 6.8  HGB 9.7* 9.9*  PLT 171 157   Recent Labs    05/10/20 2126 05/11/20 0648  NA 139 140  K 4.1 4.3  CL 105 109  CO2 26 23  GLUCOSE 106* 104*  BUN 7* <5*  CREATININE 0.81 0.73   No results for input(s): TROPONINI in the last 72 hours.  Invalid input(s): CK, MB Hepatic Function Panel Recent Labs    05/09/20 1824  PROT 6.9  ALBUMIN 3.6  AST 42*  ALT 30  ALKPHOS 92  BILITOT 0.9   Recent Labs    05/10/20 0344  CHOL 102   No results for input(s): PROTIME in the last 72 hours.  Imaging: Imaging results have been reviewed and DG Chest Portable 1 View  Result Date: 05/09/2020 CLINICAL DATA:  Chest pain EXAM: PORTABLE CHEST 1 VIEW COMPARISON:  04/29/2020 FINDINGS: Extreme left costophrenic angle is excluded from view. The visualized lungs are  well expanded, are symmetric, and are clear. No pneumothorax or pleural effusion. Cardiac size within normal limits. Coronary artery stenting has been performed. Pulmonary vascularity is normal. No acute bone abnormality. IMPRESSION: 1. No acute cardiopulmonary disease. Electronically Signed   By: Ashesh  Parikh MD   On: 05/09/2020 18:43    Cardiac Studies:  Assessment/Plan:  Acute coronary syndrome CAD Recent 3 LAD stents, 3 mm x 15 mm Resolute Onyx DES in Proximal LAD 2.5 mm x 8 mm and 15 mm Resolute Onyx DES in distal LAD HTN Hyperlipidemia H/O remote tobacco use disorder Status post hypokalemia Status post acute renal injury secondary to hypotension/ACE inhibitor's Acute on chronic anemia, rule out GI loss. Stable Plan  Continue present management Scheduled for left cardiac cath possible PTCA stenting later today. Discussed with patient again regarding risk and benefits i.e. death MI stroke need for emergency CABG local vascular complications risk of restenosis etc. and consents for PCI  LOS: 2 days    Zaine Elsass 05/11/2020, 10:08 AM   

## 2020-05-11 NOTE — Progress Notes (Signed)
Called Dr. Sharyn Lull and asked for a prescription for Nitro paste, as per the request of the pt. Dr. Sharyn Lull assured he did not need the nitro paste.

## 2020-05-11 NOTE — H&P (View-Only) (Signed)
Subjective:  Patient complained of chest pain earlier rated 1/10 received 1 sublingual nitro with relief presently denies any chest pain or shortness of breath states overall feels well.  Objective:  Vital Signs in the last 24 hours: Temp:  [97.4 F (36.3 C)-98.9 F (37.2 C)] 98.4 F (36.9 C) (08/24 0832) Pulse Rate:  [58-72] 62 (08/24 0920) Resp:  [14-19] 14 (08/24 0832) BP: (123-176)/(63-109) 169/109 (08/24 0920) SpO2:  [100 %] 100 % (08/24 0832) Weight:  [65.5 kg-66.2 kg] 65.5 kg (08/24 0543)  Intake/Output from previous day: 08/23 0701 - 08/24 0700 In: 3043.3 [P.O.:942; I.V.:2101.3] Out: 1750 [Urine:1750] Intake/Output from this shift: Total I/O In: 240 [P.O.:240] Out: 225 [Urine:225]  Physical Exam: Neck: no adenopathy, no carotid bruit, no JVD and supple, symmetrical, trachea midline Lungs: clear to auscultation bilaterally Heart: regular rate and rhythm, S1, S2 normal and Soft systolic murmur noted Abdomen: soft, non-tender; bowel sounds normal; no masses,  no organomegaly Extremities: extremities normal, atraumatic, no cyanosis or edema  Lab Results: Recent Labs    05/10/20 2126 05/11/20 0648  WBC 5.4 6.8  HGB 9.7* 9.9*  PLT 171 157   Recent Labs    05/10/20 2126 05/11/20 0648  NA 139 140  K 4.1 4.3  CL 105 109  CO2 26 23  GLUCOSE 106* 104*  BUN 7* <5*  CREATININE 0.81 0.73   No results for input(s): TROPONINI in the last 72 hours.  Invalid input(s): CK, MB Hepatic Function Panel Recent Labs    05/09/20 1824  PROT 6.9  ALBUMIN 3.6  AST 42*  ALT 30  ALKPHOS 92  BILITOT 0.9   Recent Labs    05/10/20 0344  CHOL 102   No results for input(s): PROTIME in the last 72 hours.  Imaging: Imaging results have been reviewed and DG Chest Portable 1 View  Result Date: 05/09/2020 CLINICAL DATA:  Chest pain EXAM: PORTABLE CHEST 1 VIEW COMPARISON:  04/29/2020 FINDINGS: Extreme left costophrenic angle is excluded from view. The visualized lungs are  well expanded, are symmetric, and are clear. No pneumothorax or pleural effusion. Cardiac size within normal limits. Coronary artery stenting has been performed. Pulmonary vascularity is normal. No acute bone abnormality. IMPRESSION: 1. No acute cardiopulmonary disease. Electronically Signed   By: Helyn Numbers MD   On: 05/09/2020 18:43    Cardiac Studies:  Assessment/Plan:  Acute coronary syndrome CAD Recent 3 LAD stents, 3 mm x 15 mm Resolute Onyx DES in Proximal LAD 2.5 mm x 8 mm and 15 mm Resolute Onyx DES in distal LAD HTN Hyperlipidemia H/O remote tobacco use disorder Status post hypokalemia Status post acute renal injury secondary to hypotension/ACE inhibitor's Acute on chronic anemia, rule out GI loss. Stable Plan  Continue present management Scheduled for left cardiac cath possible PTCA stenting later today. Discussed with patient again regarding risk and benefits i.e. death MI stroke need for emergency CABG local vascular complications risk of restenosis etc. and consents for PCI  LOS: 2 days    Rinaldo Cloud 05/11/2020, 10:08 AM

## 2020-05-11 NOTE — Progress Notes (Signed)
ANTICOAGULATION CONSULT NOTE   Pharmacy Consult for heparin Indication: chest pain/ACS  Allergies  Allergen Reactions  . Shellfish-Derived Products Anaphylaxis, Shortness Of Breath and Other (See Comments)    "My throat goes numb"    Patient Measurements: Height: 5\' 11"  (180.3 cm) Weight: 65.5 kg (144 lb 4.8 oz) IBW/kg (Calculated) : 75.3 Heparin Dosing Weight: 62.1kg  Vital Signs: Temp: 98.4 F (36.9 C) (08/24 0832) Temp Source: Oral (08/24 0832) BP: 163/91 (08/24 0832) Pulse Rate: 71 (08/24 0847)  Labs: Recent Labs    05/09/20 1824 05/09/20 1824 05/09/20 2209 05/10/20 0344 05/10/20 0344 05/10/20 1917 05/10/20 1917 05/10/20 2126 05/11/20 0648  HGB 11.0*   < >  --  9.8*   < > 9.9*   < > 9.7* 9.9*  HCT 34.5*   < >  --  30.9*   < > 31.5*  --  30.8* 31.3*  PLT 201   < >  --  181   < > 168  --  171 157  LABPROT  --   --   --  13.9  --   --   --   --   --   INR  --   --   --  1.1  --   --   --   --   --   HEPARINUNFRC  --   --   --  0.45  --   --   --   --  0.36  CREATININE 1.50*   < >  --  1.82*   < > 0.91  --  0.81 0.73  TROPONINIHS 115*  --  792*  --   --   --   --   --   --    < > = values in this interval not displayed.    Estimated Creatinine Clearance: 84.1 mL/min (by C-G formula based on SCr of 0.73 mg/dL).  Assessment: 44 yom presented to the ED with CP. Troponin elevated and now starting IV heparin. Baseline Hgb is slightly low at 11 and platelets are WNL. He is not on anticoagulation PTA.   Heparin level therapeutic (0.36) on gtt at 1000 units/hr. Hgb 9.9 - down from admit but stable. No bleeding noted. SCr improved. Plan for cath 8/24 at 1400 PM.   Goal of Therapy:  Heparin level 0.3-0.7 units/ml Monitor platelets by anticoagulation protocol: Yes   Plan:  Continue Heparin gtt 1000 units/hr Follow-up post-cath  9/24, PharmD, BCPS, BCCCP Clinical Pharmacist Please refer to [facility] for [facility] numbers 05/11/2020,8:50 AM

## 2020-05-11 NOTE — Progress Notes (Signed)
Pt states relief is being accomplished with one dose of Nitro.

## 2020-05-11 NOTE — Discharge Instructions (Signed)
Heart Attack A heart attack occurs when blood and oxygen supply to the heart is cut off. A heart attack causes damage to the heart that cannot be fixed. A heart attack is also called a myocardial infarction, or MI. If you think you are having a heart attack, do not wait to see if the symptoms will go away. Get medical help right away. What are the causes? This condition may be caused by:  A fatty substance (plaque) in the blood vessels (arteries). This can block the flow of blood to the heart.  A blood clot in the blood vessels that go to the heart. The blood clot blocks blood flow.  Low blood pressure.  An abnormal heartbeat.  Some diseases, such as problems in red blood cells (anemia)orproblems in breathing (respiratory failure).  Tightening (spasm) of a blood vessel that cuts off blood to the heart.  A tear in a blood vessel of the heart.  High blood pressure. What increases the risk? The following factors may make you more likely to develop this condition:  Aging. The older you are, the higher your risk.  Having a personal or family history of chest pain, heart attack, stroke, or narrowing of the arteries in the legs, arms, head, or stomach (peripheral artery disease).  Being male.  Smoking.  Not getting regular exercise.  Being overweight or obese.  Having high blood pressure.  Having high cholesterol.  Having diabetes.  Drinking too much alcohol.  Using illegal drugs, such as cocaine or methamphetamine. What are the signs or symptoms? Symptoms of this condition include:  Chest pain. It may feel like: ? Crushing or squeezing. ? Tightness, pressure, fullness, or heaviness.  Pain in the arm, neck, jaw, back, or upper body.  Shortness of breath.  Heartburn.  Upset stomach (indigestion).  Feeling like you may vomit (nauseous).  Cold sweats.  Feeling tired.  Sudden light-headedness. How is this treated? A heart attack must be treated as soon as  possible. Treatment may include:  Medicines to: ? Break up or dissolve blood clots. ? Thin blood and help prevent blood clots. ? Treat blood pressure. ? Improve blood flow to the heart. ? Reduce pain. ? Reduce cholesterol.  Procedures to widen a blocked artery and keep it open.  Open heart surgery.  Receiving oxygen.  Making your heart strong again (cardiac rehabilitation) through exercise, education, and counseling. Follow these instructions at home: Medicines  Take over-the-counter and prescription medicines only as told by your doctor. You may need to take medicine: ? To keep your blood from clotting too easily. ? To control blood pressure. ? To lower cholesterol. ? To control heart rhythms.  Do not take these medicines unless your doctor says it is okay: ? NSAIDs, such as ibuprofen. ? Supplements that have vitamin A, vitamin E, or both. ? Hormone replacement therapy that has estrogen with or without progestin. Lifestyle      Do not use any products that have nicotine or tobacco, such as cigarettes, e-cigarettes, and chewing tobacco. If you need help quitting, ask your doctor.  Avoid secondhand smoke.  Exercise regularly. Ask your doctor about a cardiac rehab program.  Eat heart-healthy foods. Your doctor will tell you what foods to eat.  Stay at a healthy weight.  Lower your stress level.  Do not use illegal drugs. Alcohol use  Do not drink alcohol if: ? Your doctor tells you not to drink. ? You are pregnant, may be pregnant, or are planning to become pregnant.    If you drink alcohol: ? Limit how much you use to:  0-1 drink a day for women.  0-2 drinks a day for men. ? Know how much alcohol is in your drink. In the [place] one drink equals one 12 oz bottle of beer (355 mL), one 5 oz glass of wine (148 mL), or one 1 oz glass of hard liquor (44 mL). General instructions  Work with your doctor to treat other problems you may have, such as diabetes or high  blood pressure.  Get screened for depression. Get treatment if needed.  Keep your vaccines up to date. Get the flu shot (influenza vaccine) every year.  Keep all follow-up visits as told by your doctor. This is important. Contact a doctor if:  You feel very sad.  You have trouble doing your daily activities. Get help right away if:  You have sudden, unexplained discomfort in your chest, arms, back, neck, jaw, or upper body.  You have shortness of breath.  You have sudden sweating or clammy skin.  You feel like you may vomit.  You vomit.  You feel tired or weak.  You get light-headed or dizzy.  You feel your heart beating fast.  You feel your heart skipping beats.  You have blood pressure that is higher than 180/120. These symptoms may be an emergency. Do not wait to see if the symptoms will go away. Get medical help right away. Call your local emergency services (911 in the [place] Do not drive yourself to the hospital. Summary  A heart attack occurs when blood and oxygen supply to the heart is cut off.  Do not take NSAIDs unless your doctor says it is okay.  Do not smoke. Avoid secondhand smoke.  Exercise regularly. Ask your doctor about a cardiac rehab program. This information is not intended to replace advice given to you by your health care provider. Make sure you discuss any questions you have with your health care provider. Document Revised: 12/16/2018 Document Reviewed: 12/16/2018 Elsevier Patient Education  2020 [employer]  Coronary Angiogram A coronary angiogram is an X-ray procedure that is used to examine the arteries in the heart. Contrast dye is injected through a long, thin tube (catheter) into these arteries. Then X-rays are taken to show any blockage in these arteries. You may have this procedure if you:  Are having chest pain, or other symptoms of angina, and you are at risk for heart disease.  Have an abnormal stress test or test of your  heart's electrical activity (electrocardiogram, or ECG).  Have chest pain and heart failure.  Are having irregular heart rhythms. A coronary angiogram or heart catheterization can show if you have valve disease or a disease of the aorta. This procedure can also be used to check the overall function of your heart muscle. Let your health care provider know about:  Any allergies you have, including allergies to medicines or contrast dye.  All medicines you are taking, including vitamins, herbs, eye drops, creams, and over-the-counter medicines.  Any problems you or family members have had with anesthetic medicines.  Any blood disorders you have.  Any surgeries you have had.  Any history of kidney problems or kidney failure.  Any medical conditions you have.  Whether you are pregnant or may be pregnant.  Whether you are breastfeeding. What are the risks? Generally, this is a safe procedure. However, problems may occur, including:  Infection.  Allergic reaction to medicines or dyes that are used.  Bleeding from  the insertion site or other places.  Damage to nearby structures, such as blood vessels, or damage to kidneys from contrast dye.  Irregular heart rhythms.  Stroke (rare).  Heart attack (rare). What happens before the procedure? Staying hydrated Follow instructions from your health care provider about hydration, which may include:  Up to 2 hours before the procedure - you may continue to drink clear liquids, such as water, clear fruit juice, black coffee, and plain tea.  Eating and drinking restrictions Follow instructions from your health care provider about eating and drinking, which may include:  8 hours before the procedure - stop eating heavy meals or foods, such as meat, fried foods, or fatty foods.  6 hours before the procedure - stop eating light meals or foods, such as toast or cereal.  6 hours before the procedure - stop drinking milk or drinks that  contain milk.  2 hours before the procedure - stop drinking clear liquids. Medicines Ask your health care provider about:  Changing or stopping your regular medicines. This is especially important if you are taking diabetes medicines or blood thinners.  Taking medicines such as aspirin and ibuprofen. These medicines can thin your blood. Do not take these medicines unless your health care provider tells you to take them. Aspirin may be recommended before coronary angiograms even if you do not normally take it.  Taking over-the-counter medicines, vitamins, herbs, and supplements. General instructions  Do not use any products that contain nicotine or tobacco for at least 4 weeks before the procedure. These products include cigarettes, e-cigarettes, and chewing tobacco. If you need help quitting, ask your health care provider.  You may have an exam or testing.  Plan to have someone take you home from the hospital or clinic.  If you will be going home right after the procedure, plan to have someone with you for 24 hours.  Ask your health care provider: ? How your insertion site will be marked. ? What steps will be taken to help prevent infection. These may include:  Removing hair at the insertion site.  Washing skin with a germ-killing soap.  Taking antibiotic medicine. What happens during the procedure?   You will lie on your back on an X-ray table.  An IV will be inserted into one of your veins.  Electrodes will be placed on your chest.  You will be given one or more of the following: ? A medicine to help you relax (sedative). ? A medicine to numb the catheter insertion area (local anesthetic).  You will be connected to a continuous ECG monitor.  The catheter will be inserted into an artery in one of these areas: ? Your groin area in your upper thigh. ? Your wrist. ? The fold of your arm, near your elbow.  An X-ray procedure (fluoroscopy) will be used to help guide the  catheter to the opening of the blood vessel to be used.  A dye will be injected into the catheter and X-rays will be taken. The dye will help to show any narrowing or blockages in the heart arteries.  Tell your health care provider if you have chest pain or trouble breathing.  If blockages are found, another procedure may be done to open the artery.  The catheter will be removed after the fluoroscopy is complete.  A bandage (dressing) will be placed over the insertion site. Pressure will be applied to stop bleeding.  The IV will be removed. The procedure may vary among health care  providers and hospitals. What happens after the procedure?  Your blood pressure, heart rate, breathing rate, and blood oxygen level will be monitored until you leave the hospital or clinic.  You will need to lie still for a few hours, or for as long as told by your health care provider. ? If the procedure is done through the groin, you will be told not to bend or cross your legs.  The insertion site and the pulse in your foot or wrist will be checked often.  More blood tests, X-rays, and an ECG may be done.  Do not drive for 24 hours if you were given a sedative during your procedure. Summary  A coronary angiogram is an X-ray procedure that is used to examine the arteries in the heart.  Contrast dye is injected through a long, thin tube (catheter) into each artery.  Tell your health care provider about any allergies you have, including allergies to contrast dye.  After the procedure, you will need to lie still for a few hours and drink plenty of fluids. This information is not intended to replace advice given to you by your health care provider. Make sure you discuss any questions you have with your health care provider. Document Revised: 03/27/2019 Document Reviewed: 03/27/2019 Elsevier Patient Education  2020 [employer].

## 2020-05-11 NOTE — Discharge Summary (Signed)
NAME: [name], [name] MEDICAL RECORD ZO:[id number] ACCOUNT [id number] DATE OF BIRTH:1950-10-06 FACILITY: [facility] LOCATION: [facility] PHYSICIAN:Celsey Asselin Jeoffrey Massed, MD  DISCHARGE SUMMARY  DATE OF DISCHARGE:  05/11/2020  ADMITTING DIAGNOSES: 1.  Acute coronary syndrome, coronary artery disease, status post recent 3 left anterior descending   stents at [facility]. 2.  Hypertension. 3.  Hyperlipidemia. 4.  History of remote tobacco use disorder. 5.  Hypokalemia. 6.  Acute renal failure.  DISCHARGE DIAGNOSES: 1.  Status post very small non-Q-wave myocardial infarction in the setting of hypotension and acute renal failure. 2.  Coronary artery disease, status post multiple percutaneous coronary interventions to left anterior descending  and recently had 3 drug-eluting stents in proximal and mid left anterior descending, status post left cardiac catheterization, noted to  have minimal in-stent restenosis in proximal and mid left anterior descending  stents with TIMI grade 3 distal flow. 3.  Hypertension. 4.  Hyperlipidemia. 5.  Status post acute renal failure in the setting of hypotension and ACE inhibitors. 6.  Status post hypokalemia. 7.  History of remote tobacco abuse. 8.  Hyperlipidemia.  DISCHARGE HOME MEDICATIONS: 1.  Tylenol 325 mg 2 tablets every 4 hours as needed. 2.  Amlodipine 5 mg 1 tablet daily. 3.  Aspirin 81 mg daily. 4.  Atorvastatin 80 mg daily. 5.  Albuterol inhaler every 4 hours as needed. 6.  Tessalon Perles 1 capsule 3 times daily as needed for cough. 7.  Librium 25 mg at bedtime. 8.  Folic acid 1 mg daily. 9.  Isosorbide mononitrate 30 mg daily. 10.  Metoprolol succinate 25 mg daily. 11.  Pantoprazole 40 mg daily. 12.  Mirapex 0.125 mg at bedtime. 13.  Flomax 0.8 mg at bedtime. 14.  Brilinta 90 mg twice daily. 15.  Vitamin C 500 mg daily. 16.  Nitrostat 0.4 mg sublingual p.r.n.  The patient has been advised to stop furosemide, lisinopril,  clopidogrel and oxycodone.  DIET:  Low salt, low cholesterol diet.  INSTRUCTIONS:  Post-cardiac catheterization instructions have been given.  Follow up with me in 1 week.  CONDITION AT DISCHARGE:  Stable.    The patient also has been advised to take aspirin 81 mg only daily while taking Brilinta 90 mg twice daily.  CONDITION AT DISCHARGE:  Stable.  BRIEF HISTORY:  The patient is a 69 year old male with past medical history significant for recent MI and had 3 LAD stents placed less than months 2 ago at [facility], came to the ER complaining of 10/10 retrosternal chest pressure.  He  took 4 sublingual nitro, along with 2-3 sprays of nitroglycerin, resulting in hypotension and getting 500 mL of normal saline bolus by EMS.  The patient's chest pain radiating to left arm and jaw, still at 2/10 chest pain.  He has past medical history  significant for hypertension, hyperlipidemia, sickle cell trait and arthritis.  EKG showed normal sinus rhythm, left atrial enlargement, and evidence of old anteroseptal wall infarction.  Chest x-ray is unremarkable.  Initial troponin is 115.  Potassium  was low at 2.8 and creatinine was 1.5.  PHYSICAL EXAMINATION: GENERAL:  He was alert, awake, oriented. VITAL SIGNS:  Blood pressure was 113/50 after fluid bolus.  Pulse was 81.  He was afebrile. HEENT:  Conjunctivae pink.  Corneas clear.  PERRLA.  Extraocular muscles intact. NECK:  Supple, no adenopathy, no carotid bruit, no JVD.  Trachea midline. LUNGS:  Basal crackles to auscultation bilaterally. CARDIOVASCULAR:  S1, S2 was normal.  There was  II/VI systolic murmur.  No click, rub, or gallop. ABDOMEN:  Soft, nontender.  Bowel sounds were normal.  No organomegaly. EXTREMITIES:  No edema, cyanosis or clubbing. NEUROLOGIC:  Grossly intact.  LABORATORY DATA:  Sodium was 141, potassium 2.8, BUN 6, creatinine 1.50.  Repeat electrolytes, sodium 140, potassium 3.1, BUN 12, creatinine 1.82.  Repeat  electrolytes:  Sodium 141, potassium 3.9, BUN 8, creatinine 0.91.  This morning, electrolytes again  sodium 140, potassium 4.3, BUN less than 5, creatinine 0.73.  Two sets of high-sensitivity troponin I was 115 and 792.  Hemoglobin was 11, hematocrit 34.5, white count of 11.0.  Repeat hemoglobin after hydration was 9.8, hematocrit 30.9, white count of  6.6.  Subsequent hemoglobins have been stable.  Last hemoglobin was 9.9, hematocrit 31.3, white count of 6.8.  His chest x-ray showed no active cardiopulmonary disease.  Repeat EKG showed normal sinus rhythm with early repolarization changes and mild  peaking of the T waves.    BRIEF HOSPITAL COURSE:  The patient was admitted to telemetry unit.  The patient ruled in for a very small non-Q-wave myocardial infarction.  The patient did not have any further episodes of anginal chest pain during the hospital stay.  Patient was well  hydrated prior to the catheterization with normalization of his renal function and also his potassium was replaced.  The patient subsequently underwent left cardiac catheterization today with selective left and right coronary angiography and LV graft.   As per procedure report, the patient tolerated the procedure well.  The patient was noted to have patent drug-eluting multiple stents with TIMI-3 distal flow.  The patient did not have any further episodes of anginal chest pain.  His groin is stable with  no evidence of hematoma or bruit.  The patient will be discharged home on above medications and will be followed up in my office in 1 week.  The patient has been discussed at length regarding lifestyle changes, compliance with medications, smoking  cessation and dual antiplatelet medication at least for 1 year as he has multiple drug-eluting stents.  VN/NUANCE D:05/11/2020 T:05/11/2020 JOB:[id number]

## 2020-05-11 NOTE — Interval H&P Note (Signed)
Cath Lab Visit (complete for each Cath Lab visit)  Clinical Evaluation Leading to the Procedure:   ACS: Yes.    Non-ACS:    Anginal Classification: CCS IV  Anti-ischemic medical therapy: Maximal Therapy (2 or more classes of medications)  Non-Invasive Test Results: No non-invasive testing performed  Prior CABG: No previous CABG      History and Physical Interval Note:  05/11/2020 11:49 AM  [name] [name]  has presented today for surgery, with the diagnosis of chest pain.  The various methods of treatment have been discussed with the patient and family. After consideration of risks, benefits and other options for treatment, the patient has consented to  Procedure(s): LEFT HEART CATH AND CORONARY ANGIOGRAPHY (N/A) as a surgical intervention.  The patient's history has been reviewed, patient examined, no change in status, stable for surgery.  I have reviewed the patient's chart and labs.  Questions were answered to the patient's satisfaction.     Rinaldo Cloud

## 2020-05-11 NOTE — Progress Notes (Signed)
Dr. Sharyn Lull paged and messaged through Epic, will wait for response. PT is stable at this time. Pt states pain is 1/10. No other complaints at this time.

## 2020-05-11 NOTE — Progress Notes (Signed)
Pt complains of chest pain. EKG obtained. Vitals obtained. Pt states pain is at a 6 and radiates to the Left shoulder. Nitro given.

## 2020-05-11 NOTE — Discharge Summary (Signed)
Discharge summary dictated on 05/11/2020 dictation number is [phone]

## 2020-05-19 ENCOUNTER — Other Ambulatory Visit: Payer: Self-pay

## 2020-05-19 ENCOUNTER — Other Ambulatory Visit: Payer: Self-pay | Admitting: Internal Medicine

## 2020-05-19 ENCOUNTER — Ambulatory Visit (INDEPENDENT_AMBULATORY_CARE_PROVIDER_SITE_OTHER): Payer: Medicare HMO | Admitting: Internal Medicine

## 2020-05-19 DIAGNOSIS — R634 Abnormal weight loss: Secondary | ICD-10-CM

## 2020-05-19 DIAGNOSIS — R0602 Shortness of breath: Secondary | ICD-10-CM

## 2020-05-19 LAB — PULMONARY FUNCTION TEST
DL/VA % pred: 126 %
DL/VA: 5.22 ml/min/mmHg/L
DLCO cor % pred: 115 %
DLCO cor: 30.69 ml/min/mmHg
DLCO unc % pred: 96 %
DLCO unc: 25.67 ml/min/mmHg
FEF 25-75 Post: 2.77 L/s
FEF 25-75 Pre: 3.65 L/s
FEF2575-%Change-Post: -24 %
FEF2575-%Pred-Post: 104 %
FEF2575-%Pred-Pre: 137 %
FEV1-%Change-Post: -5 %
FEV1-%Pred-Post: 113 %
FEV1-%Pred-Pre: 119 %
FEV1-Post: 3.38 L
FEV1-Pre: 3.57 L
FEV1FVC-%Change-Post: 2 %
FEV1FVC-%Pred-Pre: 102 %
FEV6-%Change-Post: -7 %
FEV6-%Pred-Post: 111 %
FEV6-%Pred-Pre: 119 %
FEV6-Post: 4.17 L
FEV6-Pre: 4.49 L
FEV6FVC-%Pred-Post: 104 %
FEV6FVC-%Pred-Pre: 104 %
FVC-%Change-Post: -7 %
FVC-%Pred-Post: 106 %
FVC-%Pred-Pre: 114 %
FVC-Post: 4.17 L
FVC-Pre: 4.49 L
Post FEV1/FVC ratio: 81 %
Post FEV6/FVC ratio: 100 %
Pre FEV1/FVC ratio: 79 %
Pre FEV6/FVC Ratio: 100 %
RV % pred: 79 %
RV: 1.88 L
TLC % pred: 84 %
TLC: 5.9 L

## 2020-05-19 NOTE — Progress Notes (Signed)
Full PFT performed today. °

## 2020-05-31 ENCOUNTER — Other Ambulatory Visit: Payer: Self-pay | Admitting: Internal Medicine

## 2020-06-04 ENCOUNTER — Ambulatory Visit
Admission: RE | Admit: 2020-06-04 | Discharge: 2020-06-04 | Disposition: A | Discharge status: Home / Self Care | Payer: Medicare HMO | Source: Ambulatory Visit | Attending: Internal Medicine | Admitting: Internal Medicine

## 2020-06-04 DIAGNOSIS — R634 Abnormal weight loss: Secondary | ICD-10-CM

## 2020-07-29 ENCOUNTER — Other Ambulatory Visit: Payer: Self-pay | Admitting: Internal Medicine

## 2020-07-29 ENCOUNTER — Other Ambulatory Visit: Payer: Self-pay

## 2020-07-29 ENCOUNTER — Ambulatory Visit
Admission: RE | Admit: 2020-07-29 | Discharge: 2020-07-29 | Disposition: A | Discharge status: Home / Self Care | Payer: Medicare HMO | Source: Ambulatory Visit | Attending: Internal Medicine | Admitting: Internal Medicine

## 2020-07-29 DIAGNOSIS — R053 Chronic cough: Secondary | ICD-10-CM

## 2020-07-31 ENCOUNTER — Ambulatory Visit: Payer: Medicare HMO | Attending: Internal Medicine

## 2020-07-31 DIAGNOSIS — Z23 Encounter for immunization: Secondary | ICD-10-CM

## 2020-07-31 NOTE — Progress Notes (Signed)
   Covid-19 Vaccination Clinic  Name:  MOHMMAD SALEEBY    MRN: [id number] DOB: Sep 21, 1950  07/31/2020  Mr. Geske was observed post Covid-19 immunization for 30 minutes based on pre-vaccination screening without incident. He was provided with Vaccine Information Sheet and instruction to access the V-Safe system.   Mr. Lamarque was instructed to call 911 with any severe reactions post vaccine: Marland Kitchen Difficulty breathing  . Swelling of face and throat  . A fast heartbeat  . A bad rash all over body  . Dizziness and weakness   Immunizations Administered    Name Date Dose VIS Date Route   Pfizer COVID-19 Vaccine 07/31/2020  4:52 PM 0.3 mL 07/07/2020 Intramuscular   Manufacturer: [employer], [employer]   Lot: [id number]   NDC: [address]-3

## 2020-09-03 ENCOUNTER — Ambulatory Visit
Admission: RE | Admit: 2020-09-03 | Discharge: 2020-09-03 | Disposition: A | Discharge status: Home / Self Care | Payer: Medicare HMO | Source: Ambulatory Visit | Attending: Internal Medicine | Admitting: Internal Medicine

## 2020-09-03 ENCOUNTER — Other Ambulatory Visit: Payer: Self-pay

## 2020-09-03 ENCOUNTER — Other Ambulatory Visit: Payer: Self-pay | Admitting: Internal Medicine

## 2020-09-03 DIAGNOSIS — I420 Dilated cardiomyopathy: Secondary | ICD-10-CM

## 2020-09-03 DIAGNOSIS — I255 Ischemic cardiomyopathy: Secondary | ICD-10-CM

## 2020-10-10 ENCOUNTER — Encounter (HOSPITAL_COMMUNITY): Payer: Self-pay | Admitting: Emergency Medicine

## 2020-10-10 ENCOUNTER — Emergency Department (HOSPITAL_COMMUNITY): Payer: Medicare HMO

## 2020-10-10 ENCOUNTER — Emergency Department (HOSPITAL_COMMUNITY)
Admission: EM | Admit: 2020-10-10 | Discharge: 2020-10-11 | Disposition: A | Discharge status: Home / Self Care | Payer: Medicare HMO | Attending: Emergency Medicine | Admitting: Emergency Medicine

## 2020-10-10 ENCOUNTER — Other Ambulatory Visit: Payer: Self-pay

## 2020-10-10 DIAGNOSIS — I10 Essential (primary) hypertension: Secondary | ICD-10-CM | POA: Insufficient documentation

## 2020-10-10 DIAGNOSIS — W19XXXA Unspecified fall, initial encounter: Secondary | ICD-10-CM | POA: Diagnosis not present

## 2020-10-10 DIAGNOSIS — R0602 Shortness of breath: Secondary | ICD-10-CM | POA: Insufficient documentation

## 2020-10-10 DIAGNOSIS — Z87891 Personal history of nicotine dependence: Secondary | ICD-10-CM | POA: Insufficient documentation

## 2020-10-10 DIAGNOSIS — S0990XA Unspecified injury of head, initial encounter: Secondary | ICD-10-CM | POA: Insufficient documentation

## 2020-10-10 DIAGNOSIS — Z79899 Other long term (current) drug therapy: Secondary | ICD-10-CM | POA: Insufficient documentation

## 2020-10-10 DIAGNOSIS — M6281 Muscle weakness (generalized): Secondary | ICD-10-CM | POA: Insufficient documentation

## 2020-10-10 DIAGNOSIS — M25511 Pain in right shoulder: Secondary | ICD-10-CM | POA: Diagnosis present

## 2020-10-10 LAB — CBC WITH DIFFERENTIAL/PLATELET
Abs Immature Granulocytes: 0.02 10*3/uL (ref 0.00–0.07)
Basophils Absolute: 0.1 10*3/uL (ref 0.0–0.1)
Basophils Relative: 1 %
Eosinophils Absolute: 0.2 10*3/uL (ref 0.0–0.5)
Eosinophils Relative: 3 %
HCT: 33.4 % — ABNORMAL LOW (ref 39.0–52.0)
Hemoglobin: 10.9 g/dL — ABNORMAL LOW (ref 13.0–17.0)
Immature Granulocytes: 0 %
Lymphocytes Relative: 30 %
Lymphs Abs: 2.6 10*3/uL (ref 0.7–4.0)
MCH: 29.5 pg (ref 26.0–34.0)
MCHC: 32.6 g/dL (ref 30.0–36.0)
MCV: 90.3 fL (ref 80.0–100.0)
Monocytes Absolute: 0.6 10*3/uL (ref 0.1–1.0)
Monocytes Relative: 7 %
Neutro Abs: 5.1 10*3/uL (ref 1.7–7.7)
Neutrophils Relative %: 59 %
Platelets: 223 10*3/uL (ref 150–400)
RBC: 3.7 MIL/uL — ABNORMAL LOW (ref 4.22–5.81)
RDW: 13.2 % (ref 11.5–15.5)
WBC: 8.6 10*3/uL (ref 4.0–10.5)
nRBC: 0 % (ref 0.0–0.2)

## 2020-10-10 NOTE — ED Notes (Signed)
Patient transported to CT 

## 2020-10-10 NOTE — ED Provider Notes (Signed)
[facility]-EMERGENCY DEPT Provider Note   CSN: [id number] Arrival date & time: 10/10/20  2031     History Chief Complaint  Patient presents with  . Shoulder Pain    [name] [name] is a 70 y.o. male.  70 year old male who presents from home with right upper extremity weakness which has been going on for approximately 10 days. Patient states that it has been waxing and waning and sometimes is worse when he tries to lift his arm up. Does admit to alcohol use tonight.  States he had several drinks.  Patient states that he did fall 10 days ago.  Denies any injuries from that.  Denies any illicit drug use states that he does have a significant cardiac history and states that some of this discomfort may be similar to his prior ACS symptoms. He denies any associated new shortness of breath or diaphoresis.  He called his physician this evening who I spoke with also who was concern for possible ACS versus CVA.  Patient denies any new trouble ambulating.  Denies any dyspnea on exertion.  Patient is a very difficult historian        Past Medical History:  Diagnosis Date  . Arthritis   . Cataracts, bilateral   . Hyperlipidemia   . Hypertension   . Seasonal allergies   . Sickle cell trait [facility])     Patient Active Problem List   Diagnosis Date Noted  . Acute coronary syndrome (HCC) 03/28/2019    Past Surgical History:  Procedure Laterality Date  . APPENDECTOMY    . KNEE ARTHROSCOPY WITH ANTERIOR CRUCIATE LIGAMENT (ACL) REPAIR Left   . LEFT HEART CATH AND CORONARY ANGIOGRAPHY N/A 05/11/2020   Procedure: LEFT HEART CATH AND CORONARY ANGIOGRAPHY;  Surgeon: Rinaldo Cloud, MD;  Location: [facility] INVASIVE CV LAB;  Service: Cardiovascular;  Laterality: N/A;  . STENT PLACEMENT VASCULAR ([facility] HX)         Family History  Problem Relation Age of Onset  . Colon cancer Neg Hx   . Esophageal cancer Neg Hx   . Rectal cancer Neg Hx   . Stomach cancer Neg Hx     Social  History   Tobacco Use  . Smoking status: Former Smoker    Quit date: 09/18/2008    Years since quitting: 12.0  . Smokeless tobacco: Former Neurosurgeon    Types: Chew    Quit date: 09/18/1970  Vaping Use  . Vaping Use: Never used  Substance Use Topics  . Alcohol use: Yes    Comment: occas  . Drug use: No    Home Medications Prior to Admission medications   Medication Sig Start Date End Date Taking? Authorizing Provider  acetaminophen (TYLENOL) 325 MG tablet Take 2 tablets (650 mg total) by mouth every 4 (four) hours as needed for headache or mild pain. 05/11/20   Rinaldo Cloud, MD  albuterol (VENTOLIN HFA) 108 (90 Base) MCG/ACT inhaler Inhale 2 puffs into the lungs every 4 (four) hours as needed for wheezing or shortness of breath.  07/02/19   [provider]  amLODipine (NORVASC) 5 MG tablet Take 1 tablet (5 mg total) by mouth daily. 05/12/20   Rinaldo Cloud, MD  aspirin EC 81 MG EC tablet Take 1 tablet (81 mg total) by mouth daily. Swallow whole. 05/12/20   Rinaldo Cloud, MD  atorvastatin (LIPITOR) 80 MG tablet Take 1 tablet (80 mg total) by mouth daily. 05/12/20   Rinaldo Cloud, MD  benzonatate (TESSALON) 100 MG capsule  Take 1 capsule (100 mg total) by mouth 3 (three) times daily as needed for cough. 07/30/19   Ward, Layla Maw, DO  chlordiazePOXIDE (LIBRIUM) 25 MG capsule Take 25 mg by mouth at bedtime. 04/16/20   [provider]  Difluprednate (DUREZOL) 0.05 % EMUL Place 1 drop into both eyes daily.    [provider]  folic acid (FOLVITE) 1 MG tablet Take 1 mg by mouth daily. 04/07/20   [provider]  isosorbide mononitrate (IMDUR) 30 MG 24 hr tablet Take 30 mg by mouth daily. 04/06/20   [provider]  Menthol, Topical Analgesic, (BIOFREEZE) 4 % GEL Apply 1 application topically 4 (four) times daily as needed (pain).     [provider]  metoprolol succinate (TOPROL-XL) 25 MG 24 hr tablet Take 1 tablet (25 mg total) by mouth daily.  03/02/17   Kirt Boys, DO  nepafenac (ILEVRO) 0.3 % ophthalmic suspension Place 1 drop into both eyes at bedtime.    [provider]  nitroGLYCERIN (NITROLINGUAL) 0.4 MG/SPRAY spray Place 1 spray under the tongue every 5 (five) minutes as needed for chest pain.     [provider]  pantoprazole (PROTONIX) 40 MG tablet Take 40 mg by mouth daily. 04/07/20   [provider]  pramipexole (MIRAPEX) 0.125 MG tablet Take 0.125 mg by mouth at bedtime. 03/29/20   [provider]  tamsulosin (FLOMAX) 0.4 MG CAPS capsule Take 0.8 mg by mouth at bedtime.  05/23/18   [provider]  ticagrelor (BRILINTA) 90 MG TABS tablet Take 90 mg by mouth 2 (two) times daily.    [provider]  vitamin C (ASCORBIC ACID) 500 MG tablet Take 500 mg by mouth daily. Patient not taking: Reported on 05/09/2020    [provider]    Allergies    Shellfish-derived products  Review of Systems   Review of Systems  All other systems reviewed and are negative.   Physical Exam Updated Vital Signs BP (!) 151/81 (BP Location: Right Arm)   Pulse 74   Temp 98.7 F (37.1 C) (Oral)   Resp 16   SpO2 99%   Physical Exam Vitals and nursing note reviewed.  Constitutional:      General: He is not in acute distress.    Appearance: Normal appearance. He is well-developed and well-nourished. He is not toxic-appearing.  HENT:     Head: Normocephalic and atraumatic.  Eyes:     General: Lids are normal.     Extraocular Movements: EOM normal.     Conjunctiva/sclera: Conjunctivae normal.     Pupils: Pupils are equal, round, and reactive to light.  Neck:     Thyroid: No thyroid mass.     Trachea: No tracheal deviation.  Cardiovascular:     Rate and Rhythm: Normal rate and regular rhythm.     Heart sounds: Normal heart sounds. No murmur heard. No gallop.   Pulmonary:     Effort: Pulmonary effort is normal. No respiratory distress.     Breath sounds: Normal breath  sounds. No stridor. No decreased breath sounds, wheezing, rhonchi or rales.  Abdominal:     General: Bowel sounds are normal. There is no distension.     Palpations: Abdomen is soft.     Tenderness: There is no abdominal tenderness. There is no CVA tenderness or rebound.  Musculoskeletal:        General: No tenderness or edema. Normal range of motion.     Cervical back: Normal range  of motion and neck supple.  Skin:    General: Skin is warm and dry.     Findings: No abrasion or rash.  Neurological:     Mental Status: He is alert and oriented to person, place, and time.     GCS: GCS eye subscore is 4. GCS verbal subscore is 5. GCS motor subscore is 6.     Cranial Nerves: No cranial nerve deficit.     Sensory: No sensory deficit.     Deep Tendon Reflexes: Strength normal.     Comments: Patient has no drift in his upper extremities.  Was able to stand up and ambulate.  Does have some issues with trying to raise his right arm above his head however it seems to wax and wane.  Psychiatric:        Mood and Affect: Mood and affect normal.        Speech: Speech normal.        Behavior: Behavior normal.     ED Results / Procedures / Treatments   Labs (all labs ordered are listed, but only abnormal results are displayed) Labs Reviewed  CBC WITH DIFFERENTIAL/PLATELET  BASIC METABOLIC PANEL  TROPONIN I (HIGH SENSITIVITY)    EKG EKG Interpretation  Date/Time:  Sunday October 10 2020 22:40:45 EST Ventricular Rate:  71 PR Interval:    QRS Duration: 93 QT Interval:  424 QTC Calculation: 461 R Axis:   76 Text Interpretation: Sinus rhythm Minimal ST elevation, anterior leads No significant change since last tracing Confirmed by Lorre Nick ([id number]) on 10/10/2020 10:45:57 PM   Radiology No results found.  Procedures Procedures (including critical care time)  Medications Ordered in ED Medications - No data to display  ED Course  I have reviewed the triage vital signs and the  nursing notes.  Pertinent labs & imaging results that were available during my care of the patient were reviewed by me and considered in my medical decision making (see chart for details).    MDM Rules/Calculators/A&P                          Patient with atypical symptoms for stroke versus ACS.  Will order head CT as well as cardiac enzyme.  Discussed case with patient primary care doctor who states that his work-up here is negative he was given follow-up.  Case to Dr. Judd Lien Final Clinical Impression(s) / ED Diagnoses Final diagnoses:  SOB (shortness of breath)    Rx / DC Orders ED Discharge Orders    None       Lorre Nick, MD 10/10/20 2325

## 2020-10-10 NOTE — ED Triage Notes (Signed)
Patient arrives complaining of shoulder pain after falling on the ice 10 days ago. Patient states limited movement due to pain, no neurological deficits, PMS intact. Patient is ambulatory. Patient noted to be intoxicated, 6-5 beers.

## 2020-10-11 LAB — BASIC METABOLIC PANEL
Anion gap: 10 (ref 5–15)
BUN: 6 mg/dL — ABNORMAL LOW (ref 8–23)
CO2: 27 mmol/L (ref 22–32)
Calcium: 9 mg/dL (ref 8.9–10.3)
Chloride: 103 mmol/L (ref 98–111)
Creatinine, Ser: 0.55 mg/dL — ABNORMAL LOW (ref 0.61–1.24)
GFR, Estimated: >60 mL/min (ref >60)
Glucose, Bld: 69 mg/dL — ABNORMAL LOW (ref 70–99)
Potassium: 3.3 mmol/L — ABNORMAL LOW (ref 3.5–5.1)
Sodium: 140 mmol/L (ref 135–145)

## 2020-10-11 LAB — TROPONIN I (HIGH SENSITIVITY): Troponin I (High Sensitivity): 27 ng/L — ABNORMAL HIGH (ref <18)

## 2020-10-11 NOTE — ED Provider Notes (Signed)
Care assumed from Dr. Freida Busman at shift change.  Patient sent here for evaluation of right arm pain/weakness.  Care signed out to me awaiting results of troponin and head CT.  These tests have resulted and are both unremarkable.  At this point, it seems as though patient has a prompt follow-up with his PCP.  He will be discharged and is to see him later this week.   [name] Lyons, MD 10/11/20 [phone]

## 2020-10-11 NOTE — ED Notes (Signed)
ED Provider at bedside to talk to pt about d/c

## 2020-10-11 NOTE — Discharge Instructions (Addendum)
Follow-up with your primary doctor in the next 2 to 3 days, and return to the ER if symptoms significantly worsen or change. °

## 2020-10-18 ENCOUNTER — Other Ambulatory Visit: Payer: Self-pay

## 2020-10-18 ENCOUNTER — Ambulatory Visit
Admission: RE | Admit: 2020-10-18 | Discharge: 2020-10-18 | Disposition: A | Discharge status: Home / Self Care | Payer: Medicare HMO | Source: Ambulatory Visit | Attending: Internal Medicine | Admitting: Internal Medicine

## 2020-10-18 ENCOUNTER — Other Ambulatory Visit: Payer: Self-pay | Admitting: Internal Medicine

## 2020-10-18 DIAGNOSIS — M25511 Pain in right shoulder: Secondary | ICD-10-CM

## 2020-11-12 ENCOUNTER — Other Ambulatory Visit: Payer: Self-pay | Admitting: Internal Medicine

## 2020-11-12 DIAGNOSIS — R945 Abnormal results of liver function studies: Secondary | ICD-10-CM

## 2020-11-22 ENCOUNTER — Ambulatory Visit (INDEPENDENT_AMBULATORY_CARE_PROVIDER_SITE_OTHER): Payer: Medicare HMO

## 2020-11-22 ENCOUNTER — Encounter: Payer: Self-pay | Admitting: Physician Assistant

## 2020-11-22 ENCOUNTER — Ambulatory Visit (INDEPENDENT_AMBULATORY_CARE_PROVIDER_SITE_OTHER): Payer: Medicare HMO | Admitting: Physician Assistant

## 2020-11-22 VITALS — Ht 70.5 in | Wt 132.2 lb

## 2020-11-22 DIAGNOSIS — M5442 Lumbago with sciatica, left side: Secondary | ICD-10-CM

## 2020-11-22 DIAGNOSIS — M25552 Pain in left hip: Secondary | ICD-10-CM | POA: Diagnosis not present

## 2020-11-22 NOTE — Progress Notes (Signed)
Office Visit Note   Patient: [name] [name]           Date of Birth: 1950/12/23           MRN: [id number] Visit Date: 11/22/2020              Requested by: Harvest Forest, MD [address] [place],  [place] [address] PCP: Harvest Forest, MD   Assessment & Plan: Visit Diagnoses:  1. Pain in left hip   2. Left-sided low back pain with left-sided sciatica, unspecified chronicity     Plan:  We will have him see his cardiologist for clearance for total hip replacement.  Discussed with him that he will have to cut back majorly on his alcohol consumption.  In regards to his weight loss possible cirrhosis and anemia of this all needs to be worked up before he can be scheduled for surgery.  Once these issues have been addressed he can follow-up with Dr. Magnus Ivan to see if he would be candidate for surgery.  Questions were encouraged and answered at length.  Follow-Up Instructions: No follow-ups on file.   Orders:  Orders Placed This Encounter  Procedures  . XR Lumbar Spine 2-3 Views  . XR HIP UNILAT W OR W/O PELVIS 2-3 VIEWS LEFT   No orders of the defined types were placed in this encounter.     Procedures: No procedures performed   Clinical Data: No additional findings.   Subjective: Chief Complaint  Patient presents with  . Lower Back - Pain  . Left Hip - Pain    HPI [name] [name] returns today due to left hip pain.  He does note that the right hip injection with Dr. Prince Rome did help.  His left hip pain is becoming worse.  He is not taking anything for pain however is primary care notes stated he is taking aspirin and meloxicam and has been told that this could cause gastric bleeding.  Has had 2 falls in the last 3 months fell last night.  He is also fell on snow.  He still drinks fifth of liquor a week.  Proximately 4 beers a day but states he does not drink every day. Review of Systems Denies any fevers or chills.  Objective: Vital Signs: Ht 5'  10.5" (1.791 m)   Wt 132 lb 3.2 oz (60 kg)   BMI 18.70 kg/m   Physical Exam General well-developed well-nourished male no acute distress mood affect appropriate psych alert and oriented x3 Ortho Exam Sacral region there is a well-healed scab but no impending ulcers no signs of infection.  He ambulates without any assistive device in a nonantalgic gait. Specialty Comments:  No specialty comments available.  Imaging: XR HIP UNILAT W OR W/O PELVIS 2-3 VIEWS LEFT  Result Date: 11/22/2020 AP pelvis lateral view: Left hip no acute fractures.  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  Right hip with moderate narrowing.  Both hips are well located.  XR Lumbar Spine 2-3 Views  Result Date: 11/22/2020 Lumbar spine 2 views: No acute fractures.  Disc space overall well-maintained.  No spondylolisthesis.    PMFS History: Patient Active Problem List   Diagnosis Date Noted  . Acute coronary syndrome (HCC) 03/28/2019   Past Medical History:  Diagnosis Date  . Arthritis   . Cataracts, bilateral   . Hyperlipidemia   . Hypertension   . Seasonal allergies   . Sickle cell trait (HCC)     Family History  Problem Relation Age of Onset  . Colon cancer Neg Hx   . Esophageal cancer Neg Hx   . Rectal cancer Neg Hx   . Stomach cancer Neg Hx     Past Surgical History:  Procedure Laterality Date  . APPENDECTOMY    . KNEE ARTHROSCOPY WITH ANTERIOR CRUCIATE LIGAMENT (ACL) REPAIR Left   . LEFT HEART CATH AND CORONARY ANGIOGRAPHY N/A 05/11/2020   Procedure: LEFT HEART CATH AND CORONARY ANGIOGRAPHY;  Surgeon: Rinaldo Cloud, MD;  Location: [facility] INVAS[facility] CV LAB;  Service: Cardiovascular;  Laterality: N/A;  . STENT PLACEMENT VASCULAR ([facility] HX)     Social History   Occupational History  . Occupation: Charity fundraiser at [employer]  Tobacco Use  . Smoking status: Former Smoker    Quit date: 09/18/2008    Years since quitting: 12.1  . Smokeless tobacco: Former Neurosurgeon    Types: Chew    Quit date: 09/18/1970   Vaping Use  . Vaping Use: Never used  Substance and Sexual Activity  . Alcohol use: Yes    Comment: occas  . Drug use: No  . Sexual activity: Never

## 2020-11-26 ENCOUNTER — Ambulatory Visit
Admission: RE | Admit: 2020-11-26 | Discharge: 2020-11-26 | Disposition: A | Discharge status: Home / Self Care | Payer: Medicare HMO | Source: Ambulatory Visit | Attending: Internal Medicine | Admitting: Internal Medicine

## 2020-11-26 DIAGNOSIS — R945 Abnormal results of liver function studies: Secondary | ICD-10-CM

## 2021-01-12 ENCOUNTER — Encounter (HOSPITAL_COMMUNITY): Payer: Self-pay | Admitting: *Deleted

## 2021-01-12 NOTE — Progress Notes (Signed)
Received from Dr. Sharyn Lull MD referral form for this pt to participate in Cardiac Rehab s/p NSTEMI 04/2020. Reviewed accompanying office note.  Noted in medical history that this pt was hospitalized at [facility] with STEMI and DES x 3 in June/July.  Pt also sees Dr. Dot Been at [facility].  Pt seen on 4/13. Reviewed notes in epic from his PCP and Cardiology. Pt contacted and is eager to get started in CR as he is to have hip replacement in the future.  A date at this point has not been determined.  Pt will need cardiology clearance, determination of weight loss significant decrease in his ETOH intake per progress note from [facility]. Pt made aware of our long waitlist for scheduling as there are a significant amount of patients who are waiting on scheduling.  Pt upset verbally and wanted to know if he could get in somewhere else sooner.  Contacted [facility] per his request.  Advised pt that they are also scheduling first available is June due to staff out on medical leave.  Pt is appropriate to contact for scheduling with the understanding for his safety that he will not be able to participate in exercise if he has had any alcohol beverages prior to exercise. Will have support staff contact pt for insurance benefits and scheduling when able.Alanson Aly, BSN Cardiac and Emergency planning/management officer

## 2021-01-17 ENCOUNTER — Other Ambulatory Visit (HOSPITAL_BASED_OUTPATIENT_CLINIC_OR_DEPARTMENT_OTHER): Payer: Self-pay

## 2021-01-17 DIAGNOSIS — G4733 Obstructive sleep apnea (adult) (pediatric): Secondary | ICD-10-CM

## 2021-02-11 ENCOUNTER — Ambulatory Visit (HOSPITAL_BASED_OUTPATIENT_CLINIC_OR_DEPARTMENT_OTHER): Payer: Medicare HMO | Attending: Internal Medicine | Admitting: Internal Medicine

## 2021-02-11 ENCOUNTER — Other Ambulatory Visit: Payer: Self-pay

## 2021-02-11 DIAGNOSIS — G4733 Obstructive sleep apnea (adult) (pediatric): Secondary | ICD-10-CM

## 2021-02-16 ENCOUNTER — Encounter (HOSPITAL_COMMUNITY): Payer: Self-pay

## 2021-02-16 ENCOUNTER — Telehealth (HOSPITAL_COMMUNITY): Payer: Self-pay

## 2021-02-16 NOTE — Telephone Encounter (Signed)
Unable to leave a VM in regards to cardiac rehab. Mailed letter.

## 2021-03-03 NOTE — Telephone Encounter (Signed)
No response from pt.  Closed referral  

## 2021-03-25 ENCOUNTER — Encounter (HOSPITAL_COMMUNITY): Payer: Self-pay | Admitting: Emergency Medicine

## 2021-03-25 ENCOUNTER — Observation Stay (HOSPITAL_COMMUNITY)
Admission: EM | Admit: 2021-03-25 | Discharge: 2021-03-27 | Disposition: A | Discharge status: Home / Self Care | Payer: Medicare HMO | Attending: Cardiology | Admitting: Cardiology

## 2021-03-25 ENCOUNTER — Emergency Department (HOSPITAL_COMMUNITY): Payer: Medicare HMO

## 2021-03-25 DIAGNOSIS — R079 Chest pain, unspecified: Secondary | ICD-10-CM

## 2021-03-25 DIAGNOSIS — I10 Essential (primary) hypertension: Secondary | ICD-10-CM | POA: Diagnosis not present

## 2021-03-25 DIAGNOSIS — I249 Acute ischemic heart disease, unspecified: Secondary | ICD-10-CM | POA: Diagnosis present

## 2021-03-25 DIAGNOSIS — Z79899 Other long term (current) drug therapy: Secondary | ICD-10-CM | POA: Diagnosis not present

## 2021-03-25 DIAGNOSIS — Z20822 Contact with and (suspected) exposure to covid-19: Secondary | ICD-10-CM | POA: Diagnosis not present

## 2021-03-25 DIAGNOSIS — R55 Syncope and collapse: Secondary | ICD-10-CM

## 2021-03-25 DIAGNOSIS — Z87891 Personal history of nicotine dependence: Secondary | ICD-10-CM | POA: Diagnosis not present

## 2021-03-25 DIAGNOSIS — I25118 Atherosclerotic heart disease of native coronary artery with other forms of angina pectoris: Secondary | ICD-10-CM | POA: Diagnosis not present

## 2021-03-25 DIAGNOSIS — Z7982 Long term (current) use of aspirin: Secondary | ICD-10-CM | POA: Diagnosis not present

## 2021-03-25 LAB — CBC WITH DIFFERENTIAL/PLATELET
Abs Immature Granulocytes: 0.03 10*3/uL (ref 0.00–0.07)
Basophils Absolute: 0 10*3/uL (ref 0.0–0.1)
Basophils Relative: 1 %
Eosinophils Absolute: 0.2 10*3/uL (ref 0.0–0.5)
Eosinophils Relative: 3 %
HCT: 35 % — ABNORMAL LOW (ref 39.0–52.0)
Hemoglobin: 11.2 g/dL — ABNORMAL LOW (ref 13.0–17.0)
Immature Granulocytes: 1 %
Lymphocytes Relative: 15 %
Lymphs Abs: 0.9 10*3/uL (ref 0.7–4.0)
MCH: 29.2 pg (ref 26.0–34.0)
MCHC: 32 g/dL (ref 30.0–36.0)
MCV: 91.1 fL (ref 80.0–100.0)
Monocytes Absolute: 0.5 10*3/uL (ref 0.1–1.0)
Monocytes Relative: 8 %
Neutro Abs: 4.3 10*3/uL (ref 1.7–7.7)
Neutrophils Relative %: 72 %
Platelets: 200 10*3/uL (ref 150–400)
RBC: 3.84 MIL/uL — ABNORMAL LOW (ref 4.22–5.81)
RDW: 12.4 % (ref 11.5–15.5)
WBC: 5.9 10*3/uL (ref 4.0–10.5)
nRBC: 0 % (ref 0.0–0.2)

## 2021-03-25 LAB — COMPREHENSIVE METABOLIC PANEL
ALT: 26 U/L (ref 0–44)
AST: 40 U/L (ref 15–41)
Albumin: 3.9 g/dL (ref 3.5–5.0)
Alkaline Phosphatase: 62 U/L (ref 38–126)
Anion gap: 8 (ref 5–15)
BUN: <5 mg/dL — ABNORMAL LOW (ref 8–23)
CO2: 27 mmol/L (ref 22–32)
Calcium: 9.2 mg/dL (ref 8.9–10.3)
Chloride: 105 mmol/L (ref 98–111)
Creatinine, Ser: 0.85 mg/dL (ref 0.61–1.24)
GFR, Estimated: >60 mL/min (ref >60)
Glucose, Bld: 99 mg/dL (ref 70–99)
Potassium: 3.5 mmol/L (ref 3.5–5.1)
Sodium: 140 mmol/L (ref 135–145)
Total Bilirubin: 1.1 mg/dL (ref 0.3–1.2)
Total Protein: 7.3 g/dL (ref 6.5–8.1)

## 2021-03-25 LAB — RESP PANEL BY RT-PCR (FLU A&B, COVID) ARPGX2
Influenza A by PCR: NEGATIVE
Influenza B by PCR: NEGATIVE
SARS Coronavirus 2 by RT PCR: NEGATIVE

## 2021-03-25 LAB — TROPONIN I (HIGH SENSITIVITY)
Troponin I (High Sensitivity): 18 ng/L — ABNORMAL HIGH (ref <18)
Troponin I (High Sensitivity): 19 ng/L — ABNORMAL HIGH (ref <18)

## 2021-03-25 LAB — HEPARIN LEVEL (UNFRACTIONATED): Heparin Unfractionated: 0.19 IU/mL — ABNORMAL LOW (ref 0.30–0.70)

## 2021-03-25 MED ORDER — ONDANSETRON HCL 4 MG/2ML IJ SOLN: 4.0000 mg | Freq: Four times a day (QID) | INTRAMUSCULAR | Status: DC | PRN

## 2021-03-25 MED ORDER — HEPARIN BOLUS VIA INFUSION
1000.0000 [IU] | Freq: Once | INTRAVENOUS | Status: AC
  Administered 2021-03-26: 1000 [IU] via INTRAVENOUS
  Filled 2021-03-25: qty 1000

## 2021-03-25 MED ORDER — PANTOPRAZOLE SODIUM 40 MG PO TBEC
40.0000 mg | DELAYED_RELEASE_TABLET | Freq: Every day | ORAL | Status: DC
  Administered 2021-03-26 – 2021-03-27 (×2): 40 mg via ORAL
  Filled 2021-03-25 (×2): qty 1

## 2021-03-25 MED ORDER — NITROGLYCERIN 0.4 MG SL SUBL
0.4000 mg | SUBLINGUAL_TABLET | SUBLINGUAL | Status: DC | PRN
  Administered 2021-03-26 – 2021-03-27 (×5): 0.4 mg via SUBLINGUAL
  Filled 2021-03-25: qty 1

## 2021-03-25 MED ORDER — HEPARIN BOLUS VIA INFUSION
4000.0000 [IU] | Freq: Once | INTRAVENOUS | Status: AC
  Administered 2021-03-25: 4000 [IU] via INTRAVENOUS
  Filled 2021-03-25: qty 4000

## 2021-03-25 MED ORDER — POTASSIUM CHLORIDE CRYS ER 20 MEQ PO TBCR
40.0000 meq | EXTENDED_RELEASE_TABLET | Freq: Once | ORAL | Status: AC
  Administered 2021-03-25: 40 meq via ORAL
  Filled 2021-03-25: qty 2

## 2021-03-25 MED ORDER — ACETAMINOPHEN 325 MG PO TABS: 650.0000 mg | ORAL_TABLET | ORAL | Status: DC | PRN

## 2021-03-25 MED ORDER — ATORVASTATIN CALCIUM 80 MG PO TABS
80.0000 mg | ORAL_TABLET | Freq: Every day | ORAL | Status: DC
  Administered 2021-03-25 – 2021-03-26 (×2): 80 mg via ORAL
  Filled 2021-03-25: qty 1
  Filled 2021-03-25: qty 2

## 2021-03-25 MED ORDER — ASPIRIN EC 81 MG PO TBEC
81.0000 mg | DELAYED_RELEASE_TABLET | Freq: Every day | ORAL | Status: DC
  Administered 2021-03-26: 81 mg via ORAL
  Filled 2021-03-25: qty 1

## 2021-03-25 MED ORDER — SODIUM CHLORIDE 0.9 % IV SOLN: INTRAVENOUS | Status: DC

## 2021-03-25 MED ORDER — NITROGLYCERIN 2 % TD OINT
0.5000 [in_us] | TOPICAL_OINTMENT | Freq: Four times a day (QID) | TRANSDERMAL | Status: DC
  Administered 2021-03-25 – 2021-03-26 (×4): 0.5 [in_us] via TOPICAL
  Filled 2021-03-25 (×2): qty 30

## 2021-03-25 MED ORDER — METOPROLOL TARTRATE 12.5 MG HALF TABLET
12.5000 mg | ORAL_TABLET | Freq: Two times a day (BID) | ORAL | Status: DC
  Administered 2021-03-25 – 2021-03-26 (×4): 12.5 mg via ORAL
  Filled 2021-03-25 (×4): qty 1

## 2021-03-25 MED ORDER — ASPIRIN 300 MG RE SUPP: 300.0000 mg | RECTAL | Status: AC

## 2021-03-25 MED ORDER — DICLOFENAC SODIUM 1 % EX GEL
2.0000 g | CUTANEOUS | Status: DC | PRN
  Administered 2021-03-25: 2 g via TOPICAL
  Filled 2021-03-25: qty 100

## 2021-03-25 MED ORDER — HEPARIN (PORCINE) 25000 UT/250ML-% IV SOLN
1000.0000 [IU]/h | INTRAVENOUS | Status: DC
  Administered 2021-03-25: 800 [IU]/h via INTRAVENOUS
  Filled 2021-03-25 (×2): qty 250

## 2021-03-25 MED ORDER — TICAGRELOR 90 MG PO TABS
90.0000 mg | ORAL_TABLET | Freq: Two times a day (BID) | ORAL | Status: DC
  Administered 2021-03-25 – 2021-03-26 (×3): 90 mg via ORAL
  Filled 2021-03-25 (×4): qty 1

## 2021-03-25 MED ORDER — ASPIRIN 81 MG PO CHEW: 324.0000 mg | CHEWABLE_TABLET | ORAL | Status: AC

## 2021-03-25 NOTE — H&P (Signed)
[name] [name] is an 70 y.o. male.   Chief Complaint: Chest pain/syncopal episode HPI: Patient is 70 year old male with past medical history significant for coronary artery disease status post PCI to LAD in the past, hypertension, hyperlipidemia, tobacco abuse, osteoarthritis, sickle cell trait, was transferred from urologist office as he had a syncopal episode lasting 2 to 3 minutes.  Patient denies any chest pain prior to the syncopal episode but states has been having chest pain off and on for last 2 days he has been nitro spray with relief of chest pain states pain is graded 5/10 occasionally radiating to left side of left jaw left arm.  Denies nausea vomiting diaphoresis.  Denies PND orthopnea leg swelling.  Denies palpitation lightheadedness prior to syncopal episode.  Denies any weakness in the arms or legs denies any seizure activity.  Patient presently denies any chest pain.  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  Patient states he has been getting chest pain associated with shortness of breath while working in the yard off and on for last few days.  And has used nitro spray with relief.   Past Medical History:  Diagnosis Date   Arthritis    Cataracts, bilateral    Hyperlipidemia    Hypertension    Seasonal allergies    Sickle cell trait (HCC)     Past Surgical History:  Procedure Laterality Date   APPENDECTOMY     KNEE ARTHROSCOPY WITH ANTERIOR CRUCIATE LIGAMENT (ACL) REPAIR Left    LEFT HEART CATH AND CORONARY ANGIOGRAPHY N/A 05/11/2020   Procedure: LEFT HEART CATH AND CORONARY ANGIOGRAPHY;  Surgeon: Rinaldo Cloud, MD;  Location: [facility];  Service: Cardiovascular;  Laterality: N/A;   STENT PLACEMENT VASCULAR ([facility] HX)      Family History  Problem Relation Age of Onset   Colon cancer Neg Hx    Esophageal cancer Neg Hx    Rectal cancer Neg Hx    Stomach cancer Neg Hx    Social  History:  reports that he has quit smoking. He quit smokeless tobacco use about 50 years ago.  His smokeless tobacco use included chew. He reports current alcohol use. He reports that he does not use drugs.  Allergies:  Allergies  Allergen Reactions   Shellfish-Derived Products Anaphylaxis, Shortness Of Breath and Other (See Comments)    "My throat goes numb"    (Not in a hospital admission)   Results for orders placed or performed during the hospital encounter of 03/25/21 (from the past 48 hour(s))  Comprehensive metabolic panel     Status: Abnormal   Collection Time: 03/25/21 10:49 AM  Result Value Ref Range   Sodium 140 135 - 145 mmol/L   Potassium 3.5 3.5 - 5.1 mmol/L   Chloride 105 98 - 111 mmol/L   CO2 27 22 - 32 mmol/L   Glucose, Bld 99 70 - 99 mg/dL    Comment: Glucose reference range applies only to samples taken after fasting for at least 8 hours.   BUN <5 (L) 8 - 23 mg/dL   Creatinine, Ser 8.11 0.61 - 1.24 mg/dL   Calcium 9.2 8.9 - 91.4 mg/dL   Total Protein 7.3 6.5 - 8.1 g/dL   Albumin 3.9 3.5 - 5.0 g/dL   AST 40 15 - 41 U/L   ALT 26 0 - 44 U/L   Alkaline Phosphatase 62 38 - 126 U/L   Total  Bilirubin 1.1 0.3 - 1.2 mg/dL   GFR, Estimated >70 >35 mL/min    Comment: (NOTE) Calculated using the CKD-EPI Creatinine Equation (2021)    Anion gap 8 5 - 15    Comment: Performed at [facility], [address], [place], [place] [address]  Troponin I (High Sensitivity)     Status: Abnormal   Collection Time: 03/25/21 10:49 AM  Result Value Ref Range   Troponin I (High Sensitivity) 18 (H) <18 ng/L    Comment: (NOTE) Elevated high sensitivity troponin I (hsTnI) values and significant  changes across serial measurements may suggest ACS but many other  chronic and acute conditions are known to elevate hsTnI results.  Refer to the Links section for chest pain algorithms and additional  guidance. Performed at [facility], [address] [place],  [place] [address]   CBC with Differential     Status: Abnormal   Collection Time: 03/25/21 10:49 AM  Result Value Ref Range   WBC 5.9 4.0 - 10.5 K/uL   RBC 3.84 (L) 4.22 - 5.81 MIL/uL   Hemoglobin 11.2 (L) 13.0 - 17.0 g/dL   HCT 37.1 (L) 69.6 - 78.9 %   MCV 91.1 80.0 - 100.0 fL   MCH 29.2 26.0 - 34.0 pg   MCHC 32.0 30.0 - 36.0 g/dL   RDW 38.1 01.7 - 51.0 %   Platelets 200 150 - 400 K/uL   nRBC 0.0 0.0 - 0.2 %   Neutrophils Relative % 72 %   Neutro Abs 4.3 1.7 - 7.7 K/uL   Lymphocytes Relative 15 %   Lymphs Abs 0.9 0.7 - 4.0 K/uL   Monocytes Relative 8 %   Monocytes Absolute 0.5 0.1 - 1.0 K/uL   Eosinophils Relative 3 %   Eosinophils Absolute 0.2 0.0 - 0.5 K/uL   Basophils Relative 1 %   Basophils Absolute 0.0 0.0 - 0.1 K/uL   Immature Granulocytes 1 %   Abs Immature Granulocytes 0.03 0.00 - 0.07 K/uL    Comment: Performed at [facility], [address], [place], [place] [address]  Troponin I (High Sensitivity)     Status: Abnormal   Collection Time: 03/25/21 12:43 PM  Result Value Ref Range   Troponin I (High Sensitivity) 19 (H) <18 ng/L    Comment: (NOTE) Elevated high sensitivity troponin I (hsTnI) values and significant  changes across serial measurements may suggest ACS but many other  chronic and acute conditions are known to elevate hsTnI results.  Refer to the "Links" section for chest pain algorithms and additional  guidance. Performed at [facility], [address] [place], [place] [address]    DG Chest 2 View  Result Date: 03/25/2021 CLINICAL DATA:  Left lower chest pain for 3 nights EXAM: CHEST - 2 VIEW COMPARISON:  10/10/2020 FINDINGS: No focal consolidation. No pleural effusion or pneumothorax. Heart and mediastinal contours are unremarkable. Coronary artery stent is noted. No acute osseous abnormality. IMPRESSION: No active cardiopulmonary disease. Electronically Signed   By: Elige Ko   On: 03/25/2021 12:47    Review of Systems  Constitutional:   Negative for diaphoresis and fever.  HENT:  Negative for sore throat.   Eyes:  Negative for visual disturbance.  Respiratory:  Positive for shortness of breath.   Cardiovascular:  Positive for chest pain. Negative for palpitations and leg swelling.  Gastrointestinal:  Negative for abdominal distention and abdominal pain.  Genitourinary:  Negative for dysuria and flank  pain.  Neurological:  Negative for dizziness, seizures and speech difficulty.   Blood pressure 140/80, pulse 61, temperature 97.7 F (36.5 C), temperature source Oral, resp. rate (!) 30, SpO2 96 %. Physical Exam Constitutional:      Appearance: Normal appearance.  HENT:     Head: Normocephalic and atraumatic.  Eyes:     Extraocular Movements: Extraocular movements intact.     Pupils: Pupils are equal, round, and reactive to light.  Neck:     Vascular: No carotid bruit.  Cardiovascular:     Rate and Rhythm: Normal rate and regular rhythm.     Heart sounds: Murmur (2/6 systolic murmur noted) heard.  Pulmonary:     Effort: Pulmonary effort is normal.     Breath sounds: Normal breath sounds.  Abdominal:     General: Abdomen is flat. Bowel sounds are normal.     Palpations: Abdomen is soft.  Musculoskeletal:     Cervical back: Normal range of motion and neck supple.     Comments: No clubbing cyanosis or edema  Skin:    General: Skin is warm and dry.  Neurological:     General: No focal deficit present.     Mental Status: He is alert and oriented to person, place, and time.     Assessment/Plan Acute coronary syndrome Syncope rule out cardiac arrhythmias Coronary artery disease status post PCI to LAD with multiple stents in the past Hypertension Hyperlipidemia Tobacco abuse Degenerative joint disease History of sickle cell trait Plan As per orders  Rinaldo Cloud, MD 03/25/2021, 3:10 PM

## 2021-03-25 NOTE — ED Provider Notes (Signed)
Emergency Medicine Provider Triage Evaluation Note  [name] [name] 70 y.o. male was evaluated in triage.  Pt complains of syncope , chest pain, and LUE pain.  Patient was at the urologist office earlier today and was in the doctor's office.  Dr. Nicki Reaper that patient had a syncopal episode for about 2 to 3 minutes.  Patient states that he remembers feeling like he was going to pass out.  States he remembers seeing stars and Remembers the doctor was rubbing on his chest.  He did not have any preceding chest pain.  EMS was called.  Urology office reported the patient never lost pulses.  Patient was deciding whether or not to come to the hospital started developing some chest pain and some left upper extremity pain.  He received 324 mg ASA and one-time sublingual nitro prior to arrival which alleviated his pain.  He does report that he has had some left arm soreness over the last few days that is gone into his chest.  He states it has been back and forth.  He has taken nitro for the last 2 days and states that it is improved.  He denies any abdominal pain, nausea/vomiting.   Review of Systems  Positive: CP, syncope, LUE Negative: Fevers, n/v, abd pain  Physical Exam  BP 134/82   Pulse 70   Temp 98.2 F (36.8 C) (Oral)   Resp 18   Ht 5\' 4"  (1.626 m)   Wt 65.8 kg   SpO2 100%   BMI 24.89 kg/m  Gen:   Awake, no distress   HEENT:  Atraumatic  Resp:  Normal effort  Cardiac:  Normal rate. 2+ radial pulses  Abd:   Nondistended, nontender  MSK:   Moves extremities without difficulty  Neuro:  Speech clear   Other:      Medical Decision Making  Medically screening exam initiated at 10:42 AM  Appropriate orders placed.  was informed that the remainder of the evaluation will be completed by another provider, this initial triage assessment does not replace that evaluation. They are counseled that they will need to remain in the ED until the completion of their workup, including full  H&P and results of any tests.  Risks of leaving the emergency department prior to completion of treatment were discussed. Patient was advised to inform ED staff if they are leaving before their treatment is complete. The patient acknowledged these risks and time was allowed for questions.     The patient appears stable so that the remainder of the MSE may be completed by another provider.    Clinical Impression  Syncope, CP   Portions of this note were generated with Dragon dictation software. Dictation errors may occur despite best attempts at proofreading.     Martie Lee, PA-C 03/25/21 1044    05/26/21, MD 03/26/21 [phone]

## 2021-03-25 NOTE — ED Notes (Signed)
Patient transported to X-ray 

## 2021-03-25 NOTE — ED Provider Notes (Signed)
[facility] EMERGENCY DEPARTMENT Provider Note   CSN: [id number] Arrival date & time: 03/25/21  1039     History Chief Complaint  Patient presents with   Loss of Consciousness    [name] [name] is a 69 y.o. male male past medical history of sickle cell trait, hyperlipidemia, hypertension, ACS who presents for syncopal episode, chest pain, left upper extremity pain.  He went to the urologist office this morning and while he was in the doctor's office, he had a syncopal episode.  The urologist Dr. Estimated that he was out for about 2 to 3 minutes.  He never lost a pulse.  Patient states that he did have some prodrome and states he felt like he was seeing stars right before he passed out.  No chest pain prior.  EMS was called and on their arrival, he was alert.  He initially stated no complaints but then started saying his left upper extremity was hurting and that is going into his chest.  He was given aspirin and nitro x1 with improvement in his pain.  He states over the last 2 to 3 days, he has had intermittent episodes of left-sided chest pain, left upper extremity pain.  He has been taking his nitro and that will help it.  He has not noted any fevers, abdominal pain, nausea/vomiting.  The history is provided by the patient and the EMS personnel.      Past Medical History:  Diagnosis Date   Arthritis    Cataracts, bilateral    Hyperlipidemia    Hypertension    Seasonal allergies    [facility]     Patient Active Problem List   Diagnosis Date Noted   Acute coronary syndrome (HCC) 03/28/2019    Past Surgical History:  Procedure Laterality Date   APPENDECTOMY     KNEE ARTHROSCOPY WITH ANTERIOR CRUCIATE LIGAMENT (ACL) REPAIR Left    LEFT HEART CATH AND CORONARY ANGIOGRAPHY N/A 05/11/2020   Procedure: LEFT HEART CATH AND CORONARY ANGIOGRAPHY;  Surgeon: Rinaldo Cloud, MD;  Location: [facility];  Service: Cardiovascular;  Laterality: N/A;    STENT PLACEMENT VASCULAR ([facility] HX)         Family History  Problem Relation Age of Onset   Colon cancer Neg Hx    Esophageal cancer Neg Hx    Rectal cancer Neg Hx    Stomach cancer Neg Hx     Social History   Tobacco Use   Smoking status: Former    Pack years: 0.00   Smokeless tobacco: Former    Types: Chew    Quit date: 09/18/1970  Vaping Use   Vaping Use: Never used  Substance Use Topics   Alcohol use: Yes    Comment: occas   Drug use: No    Home Medications Prior to Admission medications   Medication Sig Start Date End Date Taking? Authorizing Provider  albuterol (VENTOLIN HFA) 108 (90 Base) MCG/ACT inhaler Inhale 2 puffs into the lungs every 4 (four) hours as needed for wheezing or shortness of breath.  07/02/19  Yes [provider]  aspirin EC 81 MG EC tablet Take 1 tablet (81 mg total) by mouth daily. Swallow whole. 05/12/20  Yes Rinaldo Cloud, MD  atorvastatin (LIPITOR) 80 MG tablet Take 1 tablet (80 mg total) by mouth daily. 05/12/20  Yes Rinaldo Cloud, MD  finasteride (PROSCAR) 5 MG tablet Take 5 mg by mouth daily. 02/09/21  Yes [provider]  furosemide (  LASIX) 40 MG tablet Take 40 mg by mouth daily. 11/25/20  Yes [provider]  Iron-FA-B Cmp-C-Biot-Probiotic (FUSION PLUS) CAPS Take 1 capsule by mouth daily. 03/17/21  Yes [provider]  isosorbide mononitrate (IMDUR) 30 MG 24 hr tablet Take 30 mg by mouth daily. 04/06/20  Yes [provider]  lidocaine (LIDODERM) 5 % Place 1 patch onto the skin daily as needed (pain). 12/28/20  Yes [provider]  Menthol, Topical Analgesic, (BIOFREEZE) 4 % GEL Apply 1 application topically 4 (four) times daily as needed (pain).    Yes [provider]  metoprolol succinate (TOPROL-XL) 25 MG 24 hr tablet Take 1 tablet (25 mg total) by mouth daily. 03/02/17  Yes Montez Morita, Monica, DO  nitroGLYCERIN (NITROLINGUAL) 0.4 MG/SPRAY spray Place 1 spray under the tongue every 5  (five) minutes as needed for chest pain.    Yes [provider]  oxyCODONE (OXY IR/ROXICODONE) 5 MG immediate release tablet Take 5 mg by mouth every 6 (six) hours as needed for severe pain. 02/04/21  Yes [provider]  potassium chloride (KLOR-CON) 10 MEQ tablet Take 10 mEq by mouth daily. 12/03/20  Yes [provider]  pramipexole (MIRAPEX) 0.125 MG tablet Take 0.125 mg by mouth at bedtime. 03/29/20  Yes [provider]  ranolazine (RANEXA) 500 MG 12 hr tablet Take 500 mg by mouth 2 (two) times daily. 12/30/20  Yes [provider]  tamsulosin (FLOMAX) 0.4 MG CAPS capsule Take 0.8 mg by mouth at bedtime.  05/23/18  Yes [provider]  thiamine 100 MG tablet Take 1 tablet by mouth daily.   Yes [provider]  ticagrelor (BRILINTA) 90 MG TABS tablet Take 90 mg by mouth daily.   Yes [provider]  vitamin C (ASCORBIC ACID) 500 MG tablet Take 500 mg by mouth daily.   Yes [provider]  acetaminophen (TYLENOL) 325 MG tablet Take 2 tablets (650 mg total) by mouth every 4 (four) hours as needed for headache or mild pain. Patient not taking: No sig reported 05/11/20   Rinaldo Cloud, MD  amLODipine (NORVASC) 5 MG tablet Take 1 tablet (5 mg total) by mouth daily. Patient not taking: No sig reported 05/12/20   Rinaldo Cloud, MD  benzonatate (TESSALON) 100 MG capsule Take 1 capsule (100 mg total) by mouth 3 (three) times daily as needed for cough. Patient not taking: No sig reported 07/30/19   Ward, Layla Maw, DO    Allergies    Shellfish-derived products  Review of Systems   Review of Systems  Constitutional:  Negative for fever.  Respiratory:  Negative for cough and shortness of breath.   Cardiovascular:  Positive for chest pain.  Gastrointestinal:  Negative for abdominal pain, nausea and vomiting.  Genitourinary:  Negative for dysuria and hematuria.  Musculoskeletal:        LUE  Neurological:  Positive for  syncope. Negative for weakness and numbness.  All other systems reviewed and are negative.  Physical Exam Updated Vital Signs BP 128/85   Pulse 65   Temp 97.7 F (36.5 C) (Oral)   Resp (!) 25   SpO2 100%   Physical Exam Vitals and nursing note reviewed.  Constitutional:      Appearance: Normal appearance. He is well-developed.  HENT:     Head: Normocephalic and atraumatic.  Eyes:     General: Lids are normal.     Conjunctiva/sclera: Conjunctivae normal.     Pupils: Pupils are equal, round, and reactive  to light.  Cardiovascular:     Rate and Rhythm: Normal rate and regular rhythm.     Pulses: Normal pulses.          Radial pulses are 2+ on the right side and 2+ on the left side.     Heart sounds: Normal heart sounds. No murmur heard.   No friction rub. No gallop.  Pulmonary:     Effort: Pulmonary effort is normal.     Breath sounds: Normal breath sounds.     Comments: Lungs clear to auscultation bilaterally.  Symmetric chest rise.  No wheezing, rales, rhonchi. Abdominal:     Palpations: Abdomen is soft. Abdomen is not rigid.     Tenderness: There is no abdominal tenderness. There is no guarding.     Comments: Abdomen is soft, non-distended, non-tender. No rigidity, No guarding. No peritoneal signs.  Musculoskeletal:        General: Normal range of motion.     Cervical back: Full passive range of motion without pain.  Skin:    General: Skin is warm and dry.     Capillary Refill: Capillary refill takes less than 2 seconds.  Neurological:     Mental Status: He is alert and oriented to person, place, and time.  Psychiatric:        Speech: Speech normal.    ED Results / Procedures / Treatments   Labs (all labs ordered are listed, but only abnormal results are displayed) Labs Reviewed  COMPREHENSIVE METABOLIC PANEL - Abnormal; Notable for the following components:      Result Value   BUN <5 (*)    All other components within normal limits  CBC WITH  DIFFERENTIAL/PLATELET - Abnormal; Notable for the following components:   RBC 3.84 (*)    Hemoglobin 11.2 (*)    HCT 35.0 (*)    All other components within normal limits  TROPONIN I (HIGH SENSITIVITY) - Abnormal; Notable for the following components:   Troponin I (High Sensitivity) 18 (*)    All other components within normal limits  TROPONIN I (HIGH SENSITIVITY) - Abnormal; Notable for the following components:   Troponin I (High Sensitivity) 19 (*)    All other components within normal limits  RESP PANEL BY RT-PCR (FLU A&B, COVID) [id number]    EKG EKG Interpretation  Date/Time:  Friday March 25 2021 10:40:59 EDT Ventricular Rate:  74 PR Interval:  128 QRS Duration: 84 QT Interval:  408 QTC Calculation: 452 R Axis:   72 Text Interpretation: Normal sinus rhythm Right atrial enlargement Septal infarct , age undetermined Abnormal ECG Confirmed by Kristine Royal [phone]) on 03/25/2021 11:34:44 AM  Radiology DG Chest 2 View  Result Date: 03/25/2021 CLINICAL DATA:  Left lower chest pain for 3 nights EXAM: CHEST - 2 VIEW COMPARISON:  10/10/2020 FINDINGS: No focal consolidation. No pleural effusion or pneumothorax. Heart and mediastinal contours are unremarkable. Coronary artery stent is noted. No acute osseous abnormality. IMPRESSION: No active cardiopulmonary disease. Electronically Signed   By: Elige Ko   On: 03/25/2021 12:47    Procedures Procedures   Medications Ordered in ED Medications  potassium chloride SA (KLOR-CON) CR tablet 40 mEq (has no administration in time range)    ED Course  I have reviewed the triage vital signs and the nursing notes.  Pertinent labs & imaging results that were available during my care of the patient were reviewed by me and considered in my medical decision making (see chart for details).  MDM Rules/Calculators/A&P                          70 year old male past ministry of ACS who presents via EMS for evaluation of syncopal episode.  He was  at the urologist office when he had a syncopal episode.  He then started having some chest pain and left upper extremity pain.  He does report that over the last several days, he has had intermittent episodes of chest pain which she has taken nitro for.  Currently denies any chest pain after nitro, aspirin with EMS.  On initial arrival, he is afebrile, nontoxic-appearing.  Vital signs are stable.  On exam, he has good radial pulses bilaterally.  He is well-appearing.  Given his history, will plan for EKG, tropes, labs.  Previous noted was accidentally signed incomplete and was deleted.  Initial troponin is 18, CBC shows no leukocytosis.  Hemoglobin is 11.2.  CMP is unremarkable.  Given his cardiac history as well as the fact that he has syncopal episode today and has been having intermittent chest pain over the last 2 days, feel that this warrants admission for observation, testing.  Discussed patient with Dr. Sharyn LullHarwani (Card). He would like me to call him back when the delta trop is back.   Delta trop is 19.   Chest x-ray negative.  Discussed with Dr. Sharyn LullHarwani (cards).  He will plan to admit patient for stress test.   Portions of this note were generated with Dragon dictation software. Dictation errors may occur despite best attempts at proofreading.   Final Clinical Impression(s) / ED Diagnoses Final diagnoses:  Syncope, unspecified syncope type  Chest pain with high risk for cardiac etiology    Rx / DC Orders ED Discharge Orders     None        Rosana HoesLayden, Madalena Kesecker A, PA-C 03/25/21 1534    Wynetta FinesMessick, Peter C, MD 03/26/21 2342

## 2021-03-25 NOTE — Progress Notes (Addendum)
ANTICOAGULATION CONSULT NOTE  Pharmacy Consult for heparin Indication: chest pain/ACS  Allergies  Allergen Reactions   Shellfish-Derived Products Anaphylaxis, Shortness Of Breath and Other (See Comments)    "My throat goes numb"    Patient Measurements: Height: 5\' 11"  (180.3 cm) Weight: 52.6 kg (116 lb) IBW/kg (Calculated) : 75.3  Vital Signs: Temp: 97.9 F (36.6 C) (07/08 2249) Temp Source: Oral (07/08 2249) BP: 114/71 (07/08 2249) Pulse Rate: 60 (07/08 2249)  Labs: Recent Labs    03/25/21 1049 03/25/21 1243 03/25/21 2239  HGB 11.2*  --   --   HCT 35.0*  --   --   PLT 200  --   --   HEPARINUNFRC  --   --  0.19*  CREATININE 0.85  --   --   TROPONINIHS 18* 19*  --      Estimated Creatinine Clearance: 62.7 mL/min (by C-G formula based on SCr of 0.85 mg/dL).  Assessment: 73 YOM presenting with CP and syncopal episode, hx of CAD with PCI, he is not on anticoagulation PTA. Chronic anemia stable, plts 200.  Heparin level subtherapeutic (0.19) on gtt at 800 units/hr. No issues with line or bleeding reported per RN.  Goal of Therapy:  Heparin level 0.3-0.7 units/ml Monitor platelets by anticoagulation protocol: Yes   Plan:  Rebolus heparin 1000 units now and increase gtt to 950 units/hr. F/u 6 hour heparin level  79, PharmD, BCPS Please see amion for complete clinical pharmacist phone list 03/25/2021 11:15 PM  ADDENDUM: Heparin level at low end of therapeutic (0.3) on gtt at 950 units/hr. Will increase slightly to 1000 units/hr to keep in therapeutic range and f/u 6 hr heparin level. CA 03/26/2021 6:14 AM

## 2021-03-25 NOTE — Progress Notes (Signed)
ANTICOAGULATION CONSULT NOTE - Initial Consult  Pharmacy Consult for heparin Indication: chest pain/ACS  Allergies  Allergen Reactions   Shellfish-Derived Products Anaphylaxis, Shortness Of Breath and Other (See Comments)    "My throat goes numb"    Patient Measurements:    Vital Signs: Temp: 97.7 F (36.5 C) (07/08 1044) Temp Source: Oral (07/08 1044) BP: 128/85 (07/08 1525) Pulse Rate: 65 (07/08 1525)  Labs: Recent Labs    03/25/21 1049 03/25/21 1243  HGB 11.2*  --   HCT 35.0*  --   PLT 200  --   CREATININE 0.85  --   TROPONINIHS 18* 19*    CrCl cannot be calculated (Unknown ideal weight.).   Medical History: Past Medical History:  Diagnosis Date   Arthritis    Cataracts, bilateral    Hyperlipidemia    Hypertension    Seasonal allergies    Sickle cell trait (HCC)     Assessment: 31 YOM presenting with CP and syncopal episode, hx of CAD with PCI, he is not on anticoagulation PTA. Chronic anemia stable, plts 200  Goal of Therapy:  Heparin level 0.3-0.7 units/ml Monitor platelets by anticoagulation protocol: Yes   Plan:  Heparin 4000 units IV x 1, and gtt at 800 units/hr F/u 6 hour heparin level F/u cards plan  Daylene Posey, PharmD Clinical Pharmacist ED Pharmacist Phone # [phone] 03/25/2021 3:39 PM

## 2021-03-25 NOTE — ED Triage Notes (Signed)
Patient BIB [facility] from urology office after a 2-3 minute syncope followed by left arm pain. Patient alert, oriented, and in no apparent distress at this time. Patient given 324mg  aspirin and 1x SL NTG PTA. 20g saline lock in in left hand.

## 2021-03-25 NOTE — ED Provider Notes (Deleted)
[facility] EMERGENCY DEPARTMENT Provider Note   CSN: [id number] Arrival date & time: 03/25/21  1039     History Chief Complaint  Patient presents with   Loss of Consciousness    [name] [name] is a 70 y.o. male past medical history of sickle cell trait, hyperlipidemia, hypertension, ACS who presents for syncopal episode, chest pain, left upper extremity pain.  He went to the urologist office this morning and while he was in the doctor's office, he had a syncopal episode.  The urologist Dr. Estimated that he was out for about 2 to 3 minutes.  He never lost a pulse.  Patient states that he did have some prodrome and states he felt like he was seeing stars right before he passed out.  No chest pain prior.  EMS was called and on their arrival, he was alert.  He initially stated no complaints but then started saying his left upper extremity was hurting and that is going into his chest.  He was given aspirin and nitro x1 with improvement in his pain.  He states over the last 2 to 3 days, he has had intermittent episodes of left-sided chest pain, left upper extremity pain.  He has been taking his nitro and that will help it.  He has not noted any fevers, abdominal pain, nausea/vomiting.  The history is provided by the patient.      Past Medical History:  Diagnosis Date   Arthritis    Cataracts, bilateral    Hyperlipidemia    Hypertension    Seasonal allergies    Sickle cell trait [facility])     Patient Active Problem List   Diagnosis Date Noted   Acute coronary syndrome (HCC) 03/28/2019    Past Surgical History:  Procedure Laterality Date   APPENDECTOMY     KNEE ARTHROSCOPY WITH ANTERIOR CRUCIATE LIGAMENT (ACL) REPAIR Left    LEFT HEART CATH AND CORONARY ANGIOGRAPHY N/A 05/11/2020   Procedure: LEFT HEART CATH AND CORONARY ANGIOGRAPHY;  Surgeon: Rinaldo Cloud, MD;  Location: [facility] INVASIVE CV LAB;  Service: Cardiovascular;  Laterality: N/A;   STENT PLACEMENT VASCULAR ([facility]  HX)         Family History  Problem Relation Age of Onset   Colon cancer Neg Hx    Esophageal cancer Neg Hx    Rectal cancer Neg Hx    Stomach cancer Neg Hx     Social History   Tobacco Use   Smoking status: Former    Pack years: 0.00   Smokeless tobacco: Former    Types: Chew    Quit date: 09/18/1970  Vaping Use   Vaping Use: Never used  Substance Use Topics   Alcohol use: Yes    Comment: occas   Drug use: No    Home Medications Prior to Admission medications   Medication Sig Start Date End Date Taking? Authorizing Provider  acetaminophen (TYLENOL) 325 MG tablet Take 2 tablets (650 mg total) by mouth every 4 (four) hours as needed for headache or mild pain. 05/11/20   Rinaldo Cloud, MD  albuterol (VENTOLIN HFA) 108 (90 Base) MCG/ACT inhaler Inhale 2 puffs into the lungs every 4 (four) hours as needed for wheezing or shortness of breath.  07/02/19   [provider]  amLODipine (NORVASC) 5 MG tablet Take 1 tablet (5 mg total) by mouth daily. 05/12/20   Rinaldo Cloud, MD  aspirin EC 81 MG EC tablet Take 1 tablet (81 mg total) by mouth daily. Swallow whole.  05/12/20   Rinaldo Cloud, MD  atorvastatin (LIPITOR) 80 MG tablet Take 1 tablet (80 mg total) by mouth daily. 05/12/20   Rinaldo Cloud, MD  benzonatate (TESSALON) 100 MG capsule Take 1 capsule (100 mg total) by mouth 3 (three) times daily as needed for cough. 07/30/19   Ward, Layla Maw, DO  chlordiazePOXIDE (LIBRIUM) 25 MG capsule Take 25 mg by mouth at bedtime. 04/16/20   [provider]  Difluprednate (DUREZOL) 0.05 % EMUL Place 1 drop into both eyes daily.    [provider]  folic acid (FOLVITE) 1 MG tablet Take 1 mg by mouth daily. 04/07/20   [provider]  isosorbide mononitrate (IMDUR) 30 MG 24 hr tablet Take 30 mg by mouth daily. 04/06/20   [provider]  Menthol, Topical Analgesic, (BIOFREEZE) 4 % GEL Apply 1 application topically 4 (four) times daily as needed (pain).      [provider]  metoprolol succinate (TOPROL-XL) 25 MG 24 hr tablet Take 1 tablet (25 mg total) by mouth daily. 03/02/17   Kirt Boys, DO  nepafenac (ILEVRO) 0.3 % ophthalmic suspension Place 1 drop into both eyes at bedtime.    [provider]  nitroGLYCERIN (NITROLINGUAL) 0.4 MG/SPRAY spray Place 1 spray under the tongue every 5 (five) minutes as needed for chest pain.     [provider]  pantoprazole (PROTONIX) 40 MG tablet Take 40 mg by mouth daily. 04/07/20   [provider]  pramipexole (MIRAPEX) 0.125 MG tablet Take 0.125 mg by mouth at bedtime. 03/29/20   [provider]  tamsulosin (FLOMAX) 0.4 MG CAPS capsule Take 0.8 mg by mouth at bedtime.  05/23/18   [provider]  ticagrelor (BRILINTA) 90 MG TABS tablet Take 90 mg by mouth 2 (two) times daily.    [provider]  vitamin C (ASCORBIC ACID) 500 MG tablet Take 500 mg by mouth daily. Patient not taking: Reported on 05/09/2020    [provider]    Allergies    Shellfish-derived products  Review of Systems   Review of Systems  Constitutional:  Negative for fever.  Respiratory:  Negative for cough and shortness of breath.   Cardiovascular:  Positive for chest pain.  Gastrointestinal:  Negative for abdominal pain, nausea and vomiting.  Musculoskeletal:        LUE pain  Neurological:  Positive for syncope.  All other systems reviewed and are negative.  Physical Exam Updated Vital Signs BP 112/81 (BP Location: Left Leg)   Pulse 71   Temp 97.7 F (36.5 C) (Oral)   Resp 16   SpO2 100%   Physical Exam Vitals and nursing note reviewed.  Constitutional:      Appearance: Normal appearance. He is well-developed.  HENT:     Head: Normocephalic and atraumatic.  Eyes:     General: Lids are normal.     Conjunctiva/sclera: Conjunctivae normal.     Pupils: Pupils are equal, round, and reactive to light.  Cardiovascular:     Rate and Rhythm: Normal rate and  regular rhythm.     Pulses: Normal pulses.          Radial pulses are 2+ on the right side and 2+ on the left side.     Heart sounds: Normal heart sounds. No murmur heard.   No friction rub. No gallop.  Pulmonary:     Effort: Pulmonary effort is normal.     Breath sounds: Normal breath sounds.     Comments:  Lungs clear to auscultation bilaterally.  Symmetric chest rise.  No wheezing, rales, rhonchi. Abdominal:     Palpations: Abdomen is soft. Abdomen is not rigid.     Tenderness: There is no abdominal tenderness. There is no guarding.     Comments: Abdomen is soft, non-distended, non-tender. No rigidity, No guarding. No peritoneal signs.  Musculoskeletal:        General: Normal range of motion.     Cervical back: Full passive range of motion without pain.  Skin:    General: Skin is warm and dry.     Capillary Refill: Capillary refill takes less than 2 seconds.  Neurological:     Mental Status: He is alert and oriented to person, place, and time.  Psychiatric:        Speech: Speech normal.    ED Results / Procedures / Treatments   Labs (all labs ordered are listed, but only abnormal results are displayed) Labs Reviewed  COMPREHENSIVE METABOLIC PANEL  CBC WITH DIFFERENTIAL/PLATELET  CBG MONITORING, ED  TROPONIN I (HIGH SENSITIVITY)    EKG None  Radiology No results found.  Procedures Procedures   Medications Ordered in ED Medications - No data to display  ED Course  I have reviewed the triage vital signs and the nursing notes.  Pertinent labs & imaging results that were available during my care of the patient were reviewed by me and considered in my medical decision making (see chart for details).    MDM Rules/Calculators/A&P                          70 year old male past ministry of ACS with stent, hypertension who presents for evaluation of syncopal episode, chest pain, left upper extremity.  States he has had intermittent chest pain, left upper extremity pain  over last 2 days.  He has been taking nitro with improvement.  Got nitro, aspirin with EMS today and states that resolved his pain.  On initial arrival, he is afebrile, toxic appearing.  Vital signs are stable.  On exam, he has good radial pulses bilaterally.  We will plan to check labs, EKG, chest x-ray.   Portions of this note were generated with Scientist, clinical (histocompatibility and immunogenetics). Dictation errors may occur despite best attempts at proofreading.   Final Clinical Impression(s) / ED Diagnoses Final diagnoses:  None    Rx / DC Orders ED Discharge Orders     None        Maxwell Caul, PA-C 03/25/21 1146

## 2021-03-26 ENCOUNTER — Observation Stay (HOSPITAL_COMMUNITY): Payer: Medicare HMO

## 2021-03-26 DIAGNOSIS — Z87891 Personal history of nicotine dependence: Secondary | ICD-10-CM | POA: Diagnosis not present

## 2021-03-26 DIAGNOSIS — I10 Essential (primary) hypertension: Secondary | ICD-10-CM | POA: Diagnosis not present

## 2021-03-26 DIAGNOSIS — Z20822 Contact with and (suspected) exposure to covid-19: Secondary | ICD-10-CM | POA: Diagnosis not present

## 2021-03-26 DIAGNOSIS — I25118 Atherosclerotic heart disease of native coronary artery with other forms of angina pectoris: Secondary | ICD-10-CM | POA: Diagnosis not present

## 2021-03-26 LAB — LIPID PANEL
Cholesterol: 80 mg/dL (ref 0–200)
HDL: 53 mg/dL (ref >40)
LDL Cholesterol: 24 mg/dL (ref 0–99)
Total CHOL/HDL Ratio: 1.5 RATIO
Triglycerides: 16 mg/dL (ref <150)
VLDL: 3 mg/dL (ref 0–40)

## 2021-03-26 LAB — BASIC METABOLIC PANEL
Anion gap: 4 — ABNORMAL LOW (ref 5–15)
BUN: 11 mg/dL (ref 8–23)
CO2: 28 mmol/L (ref 22–32)
Calcium: 8.4 mg/dL — ABNORMAL LOW (ref 8.9–10.3)
Chloride: 102 mmol/L (ref 98–111)
Creatinine, Ser: 0.97 mg/dL (ref 0.61–1.24)
GFR, Estimated: >60 mL/min (ref >60)
Glucose, Bld: 97 mg/dL (ref 70–99)
Potassium: 4.1 mmol/L (ref 3.5–5.1)
Sodium: 134 mmol/L — ABNORMAL LOW (ref 135–145)

## 2021-03-26 LAB — CBC
HCT: 31.6 % — ABNORMAL LOW (ref 39.0–52.0)
Hemoglobin: 10.5 g/dL — ABNORMAL LOW (ref 13.0–17.0)
MCH: 29.7 pg (ref 26.0–34.0)
MCHC: 33.2 g/dL (ref 30.0–36.0)
MCV: 89.3 fL (ref 80.0–100.0)
Platelets: 192 10*3/uL (ref 150–400)
RBC: 3.54 MIL/uL — ABNORMAL LOW (ref 4.22–5.81)
RDW: 12.3 % (ref 11.5–15.5)
WBC: 6.7 10*3/uL (ref 4.0–10.5)
nRBC: 0 % (ref 0.0–0.2)

## 2021-03-26 LAB — HEPARIN LEVEL (UNFRACTIONATED)
Heparin Unfractionated: 0.3 IU/mL (ref 0.30–0.70)
Heparin Unfractionated: 0.33 IU/mL (ref 0.30–0.70)

## 2021-03-26 LAB — MAGNESIUM: Magnesium: 1.7 mg/dL (ref 1.7–2.4)

## 2021-03-26 MED ORDER — TECHNETIUM TC 99M TETROFOSMIN IV KIT
31.4000 | PACK | Freq: Once | INTRAVENOUS | Status: AC | PRN
  Administered 2021-03-26: 31.4 via INTRAVENOUS

## 2021-03-26 MED ORDER — REGADENOSON 0.4 MG/5ML IV SOLN
0.4000 mg | Freq: Once | INTRAVENOUS | Status: AC
  Filled 2021-03-26: qty 5

## 2021-03-26 MED ORDER — AMINOPHYLLINE 25 MG/ML IV (NUC MED): 75.0000 mg | Freq: Once | INTRAVENOUS | Status: AC

## 2021-03-26 MED ORDER — REGADENOSON 0.4 MG/5ML IV SOLN
INTRAVENOUS | Status: AC
  Administered 2021-03-26: 0.4 mg via INTRAVENOUS
  Filled 2021-03-26: qty 5

## 2021-03-26 MED ORDER — TAMSULOSIN HCL 0.4 MG PO CAPS
0.4000 mg | ORAL_CAPSULE | Freq: Every day | ORAL | Status: DC
  Administered 2021-03-26 (×2): 0.4 mg via ORAL
  Filled 2021-03-26 (×2): qty 1

## 2021-03-26 MED ORDER — TECHNETIUM TC 99M TETROFOSMIN IV KIT
10.9000 | PACK | Freq: Once | INTRAVENOUS | Status: AC | PRN
  Administered 2021-03-26: 10.9 via INTRAVENOUS

## 2021-03-26 MED ORDER — AMINOPHYLLINE 25 MG/ML IV SOLN
INTRAVENOUS | Status: AC
  Administered 2021-03-26: 75 mg via INTRAVENOUS
  Filled 2021-03-26: qty 10

## 2021-03-26 MED ORDER — HEPARIN SODIUM (PORCINE) 5000 UNIT/ML IJ SOLN
5000.0000 [IU] | Freq: Three times a day (TID) | INTRAMUSCULAR | Status: DC
  Administered 2021-03-26 – 2021-03-27 (×2): 5000 [IU] via SUBCUTANEOUS
  Filled 2021-03-26 (×2): qty 1

## 2021-03-26 MED ORDER — ISOSORBIDE MONONITRATE ER 30 MG PO TB24
30.0000 mg | ORAL_TABLET | Freq: Every day | ORAL | Status: DC
  Administered 2021-03-26: 30 mg via ORAL
  Filled 2021-03-26: qty 1

## 2021-03-26 NOTE — Progress Notes (Deleted)
ANTICOAGULATION CONSULT NOTE   Pharmacy Consult for heparin Indication: chest pain/ACS  Allergies  Allergen Reactions   Shellfish-Derived Products Anaphylaxis, Shortness Of Breath and Other (See Comments)    "My throat goes numb"    Patient Measurements: Height: 5\' 11"  (180.3 cm) Weight: 52.6 kg (116 lb) IBW/kg (Calculated) : 75.3  Vital Signs: Temp: 98 F (36.7 C) (07/09 1050) Temp Source: Oral (07/09 1050) BP: 112/56 (07/09 1500) Pulse Rate: 62 (07/09 1500)  Labs: Recent Labs    03/25/21 1049 03/25/21 1243 03/25/21 2239 03/26/21 0024 03/26/21 0547 03/26/21 1156  HGB 11.2*  --   --  10.5*  --   --   HCT 35.0*  --   --  31.6*  --   --   PLT 200  --   --  192  --   --   HEPARINUNFRC  --   --  0.19*  --  0.30 0.33  CREATININE 0.85  --   --  0.97  --   --   TROPONINIHS 18* 19*  --   --   --   --      Estimated Creatinine Clearance: 55 mL/min (by C-G formula based on SCr of 0.97 mg/dL).   Medical History: Past Medical History:  Diagnosis Date   Arthritis    Cataracts, bilateral    Hyperlipidemia    Hypertension    Seasonal allergies    Sickle cell trait (HCC)     Assessment: 64 YOM presenting with CP and syncopal episode, hx of CAD with PCI, he is not on anticoagulation PTA. Chronic anemia stable, plts 200  Heparin level therapeutic s/p rate increase to 1000 units/hr  Goal of Therapy:  Heparin level 0.3-0.7 units/ml Monitor platelets by anticoagulation protocol: Yes   Plan:  Continue heparin gtt at 1000 units/hr Daily heparin level, CBC, s/s bleeding F/u cards plan  79, PharmD Clinical Pharmacist ED Pharmacist Phone # [phone] 03/26/2021 4:14 PM

## 2021-03-26 NOTE — Progress Notes (Addendum)
Having 3-4/10 chest pressure, starts left neck and works its way down to left chest, with big breath radiates to left stomach area. Dr Sharyn Lull present and aware Nail beds pink and skin warm and dry, denies SOB. Nitro patch removed

## 2021-03-26 NOTE — Plan of Care (Signed)

## 2021-03-26 NOTE — Progress Notes (Signed)
Patient had increased chest pressure after Lexiscan given, 6-7 out of 10, not resolving so  Aminophylline 75mg  reversal given at 0928. Chest pressure down to 4/10 as pretest by 0930. Chest pain resolved completely by 0942. Patient given snack and drink and transferred to Molecular Imaging for repeat scan. Is on monitor from [facility]. 2C RN was called and above information reported to her, also aware nitro patch was removed and can be replaced on return to 2C. Patient VS remained stable, no nausea,vomiting, no SOB. Nail beds pink and skin warm and dry.

## 2021-03-26 NOTE — Plan of Care (Signed)
  Problem: Education: Goal: Knowledge of General Education information will improve Description: Including pain rating scale, medication(s)/side effects and non-pharmacologic comfort measures Outcome: Progressing   Problem: Clinical Measurements: Goal: Ability to maintain clinical measurements within normal limits will improve Outcome: Progressing Goal: Cardiovascular complication will be avoided Outcome: Progressing   Problem: Safety: Goal: Ability to remain free from injury will improve Outcome: Progressing   

## 2021-03-27 DIAGNOSIS — Z20822 Contact with and (suspected) exposure to covid-19: Secondary | ICD-10-CM | POA: Diagnosis not present

## 2021-03-27 DIAGNOSIS — I10 Essential (primary) hypertension: Secondary | ICD-10-CM | POA: Diagnosis not present

## 2021-03-27 DIAGNOSIS — Z87891 Personal history of nicotine dependence: Secondary | ICD-10-CM | POA: Diagnosis not present

## 2021-03-27 DIAGNOSIS — I25118 Atherosclerotic heart disease of native coronary artery with other forms of angina pectoris: Secondary | ICD-10-CM | POA: Diagnosis not present

## 2021-03-27 LAB — CBC
HCT: 31.7 % — ABNORMAL LOW (ref 39.0–52.0)
Hemoglobin: 10.4 g/dL — ABNORMAL LOW (ref 13.0–17.0)
MCH: 29.5 pg (ref 26.0–34.0)
MCHC: 32.8 g/dL (ref 30.0–36.0)
MCV: 89.8 fL (ref 80.0–100.0)
Platelets: 174 10*3/uL (ref 150–400)
RBC: 3.53 MIL/uL — ABNORMAL LOW (ref 4.22–5.81)
RDW: 12.3 % (ref 11.5–15.5)
WBC: 6.6 10*3/uL (ref 4.0–10.5)
nRBC: 0 % (ref 0.0–0.2)

## 2021-03-27 MED ORDER — PANTOPRAZOLE SODIUM 40 MG PO TBEC: 40.0000 mg | DELAYED_RELEASE_TABLET | Freq: Every day | ORAL | 1 refills | Status: AC

## 2021-03-27 MED ORDER — AMLODIPINE BESYLATE 2.5 MG PO TABS: 2.5000 mg | ORAL_TABLET | Freq: Every day | ORAL | 11 refills | Status: DC

## 2021-03-27 NOTE — Discharge Summary (Signed)
NAME: [name] [name], [name] MEDICAL RECORD NO: [id number] ACCOUNT NO: [id number] DATE OF BIRTH: 21-Sep-1950 FACILITY: [facility] LOCATION: [facility] PHYSICIAN: Ladarion Munyon N. Sharyn Lull, MD  Discharge Summary   DATE OF DISCHARGE: 03/27/2021  ADMITTING DIAGNOSES:  1.  Acute coronary syndrome, rule out myocardial infarction. 2.  Syncope, rule out cardiac arrhythmias. 3.  Coronary artery disease, status post percutaneous coronary intervention to left anterior descending with multiple left anterior descending stents in the past at [facility]. 4.  Hypertension. 5.  Hyperlipidemia. 6.  Tobacco abuse. 7.  History of ETOH abuse. 8.  Degenerative joint disease. 9.  History of sickle cell trait.  DISCHARGE DIAGNOSES:    1.  Stable angina.  Myocardial infarction ruled out. Negative nuclear stress test. 2.  Status post syncope, workup negative, will need event monitor as outpatient. 3.  Coronary artery disease, status post percutaneous coronary intervention to left anterior descending with multiple left anterior descending stents in the past, status post left cardiac catheterization approximately a year ago, noted to have patent  stents. 4.  Hypertension. 5.  Hyperlipidemia. 6.  Tobacco abuse. 7.  History of ETOH abuse. 8.  Degenerative joint disease. 9.  History of sickle cell trait.   DISCHARGE MEDICATIONS:  1.  Tylenol 325 mg 2 tablets every 4 hours as needed. 2.  Protonix 40 mg daily. 3.  Albuterol inhaler 2 puffs every 4 hours as needed. 4.  Aspirin 81 mg 1 tablet daily. 5.  Atorvastatin 80 mg daily. 6.  Biofreeze 4% gel 4 times daily as before. 7.  Proscar 5 mg daily. 8.  Fusion Plus capsule 1 capsule daily. 9.  Isosorbide mononitrate 30 mg daily. 10.  Lidocaine 5% one patch onto the skin as needed. 11.  Metoprolol succinate 25 mg 1 tablet daily. 12.  Nitrostat 0.4 mg per spray, use as directed every 5 minutes for anginal chest pain. 13.  Mirapex 0.125 mg daily at bedtime. 14.   Ranexa 500 mg twice daily. 15.  Flomax 0.8 mg by mouth at bedtime. 16.  Thiamine 100 mg daily. 17.  Brilinta 90 mg twice daily. 18.  Vitamin C 500 mg daily. 19.  Amlodipine 2.5 mg 1 tablet daily.  The patient has been advised to stop furosemide, potassium chloride, oxycodone and Tessalon Perles.  DIET:  Low salt, low cholesterol.  ACTIVITY:  As tolerated.  Follow up with me in 1 week.  CONDITION AT DISCHARGE:  Stable.  BRIEF HISTORY:  The patient is a 70 year old male with past medical history significant for coronary artery disease, status post PCI to LAD in the past, hypertension, hyperlipidemia, tobacco abuse, history of alcohol abuse, osteoarthritis, sickle cell  trait, who was transferred from urologist's office as he had syncopal episode lasting 2-3 minutes.  The patient denies any chest pain prior to syncopal episode, but states has been having chest pain off and on for the last 2 days. Has been using nitro  spray with relief of chest pain.  States the pain is grade 5/10, occasionally radiating to the left side, of the left jaw and left arm.  Denies nausea, vomiting, diaphoresis.  Denies PND, orthopnea, leg swelling.  Denies palpitation, lightheadedness  prior to syncopal episode.  Denies any weakness in the arms or legs.  Denies any seizure activity.  The patient presently denies any chest pain.  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  No acute ischemic changes were noted.  High-sensitivity troponin I  were essentially flat.  The patient states he  has been getting chest pain associated with shortness of breath while working in the yard off and on for the last few days and has used nitro spray with relief.  PHYSICAL EXAMINATION: GENERAL:  He was alert, awake, oriented x3, in no acute distress. VITAL SIGNS:  Blood pressure was 140/80, pulse 61.  He was afebrile.  HEENT:  Conjunctivae are pink.  NECK:  Supple, no JVD, no bruit.  LUNGS:  Clear to auscultation, without rhonchi or  rales. CARDIOVASCULAR:  S1, S2 was normal.  There was II/VI systolic murmur.  ABDOMEN:  Soft.  Bowel sounds present, nontender.  EXTREMITIES:  There was no clubbing, cyanosis or edema.  PERTINENT LABORATORY DATA:  Sodium was 140, potassium 3.5 and repeat potassium was 4.1, BUN less than 5, creatinine 0.85.  High-sensitivity troponin I was 18 and 19.  Hemoglobin was 11.2, hematocrit 35.0, white count of 5.9. Chest x-ray showed no active  cardiopulmonary disease.  EKG showed normal sinus rhythm with old septal wall MI.  No acute ischemic changes were noted on multiple EKGs.  His nuclear stress test yesterday showed no reversible ischemia or infarction.  Mild global hypokinesia.  Left  ventricular ejection fraction 41%.  BRIEF HOSPITAL COURSE:  The patient was admitted to cardiac telemetry unit.  MI was ruled out by serial enzymes and EKG.  The patient did have vague chest pain localized on the left side and in the back and left jaw.  Multiple EKGs were done during the  hospital stay, during chest pain, also with no evidence of ischemic changes.  The patient underwent nuclear stress test as above, which showed no evidence of ischemia on the nuclear scan, also did not have any EKG changes during the nuclear stress test.   The patient's chest pain appears to be atypical, noncardiac in origin.  We will add PPI as per discharge summary and we will refer him to GI if continues to have recurrent chest pain.  The patient had significant drop in EF by nuclear stress test.  We  will repeat 2D echo as outpatient to confirm his LV systolic function.  If remains depressed, we will start Entresto as outpatient as blood pressure tolerates.  The patient will be discharged home on above medications and will be followed up in the  office in 1 week.  Also, we will arrange for event monitor as outpatient as the patient had recurrent syncope in the past also, although workup has been negative.   SHW D: 03/27/2021 9:07:44  am T: 03/27/2021 10:23:00 am  JOB: [id number]/ [id number]

## 2021-03-27 NOTE — Discharge Summary (Signed)
Discharge summary dictated on 03/27/2021 discharge summary number is [id number]

## 2021-06-20 ENCOUNTER — Ambulatory Visit: Payer: Medicare HMO | Admitting: Physician Assistant

## 2021-08-10 ENCOUNTER — Other Ambulatory Visit: Payer: Self-pay | Admitting: Internal Medicine

## 2021-08-10 DIAGNOSIS — Z122 Encounter for screening for malignant neoplasm of respiratory organs: Secondary | ICD-10-CM

## 2021-08-16 ENCOUNTER — Encounter: Payer: Self-pay | Admitting: Podiatry

## 2021-08-16 ENCOUNTER — Encounter (INDEPENDENT_AMBULATORY_CARE_PROVIDER_SITE_OTHER): Payer: Self-pay | Admitting: Podiatry

## 2021-08-16 NOTE — Progress Notes (Signed)
This encounter was created in error - please disregard.

## 2021-08-25 NOTE — Addendum Note (Signed)
Addended by: Helane Gunther on: 08/25/2021 12:02 PM   Modules accepted: Level of Service

## 2022-01-23 ENCOUNTER — Other Ambulatory Visit: Payer: Self-pay | Admitting: Family Medicine

## 2022-01-23 DIAGNOSIS — Z136 Encounter for screening for cardiovascular disorders: Secondary | ICD-10-CM

## 2022-02-10 ENCOUNTER — Other Ambulatory Visit: Payer: Self-pay | Admitting: Internal Medicine

## 2022-02-10 DIAGNOSIS — I714 Abdominal aortic aneurysm, without rupture, unspecified: Secondary | ICD-10-CM

## 2022-02-20 ENCOUNTER — Other Ambulatory Visit: Payer: Medicare HMO

## 2022-02-23 ENCOUNTER — Ambulatory Visit
Admission: RE | Admit: 2022-02-23 | Discharge: 2022-02-23 | Disposition: A | Discharge status: Home / Self Care | Payer: Medicare HMO | Source: Ambulatory Visit | Attending: Internal Medicine | Admitting: Internal Medicine

## 2022-02-23 DIAGNOSIS — I714 Abdominal aortic aneurysm, without rupture, unspecified: Secondary | ICD-10-CM

## 2022-04-14 ENCOUNTER — Encounter (INDEPENDENT_AMBULATORY_CARE_PROVIDER_SITE_OTHER): Payer: Medicare HMO | Admitting: Podiatry

## 2022-04-14 NOTE — Progress Notes (Signed)
No Show This encounter was created in error - please disregard. 

## 2022-04-22 IMAGING — CT CT ABD-PELV W/ CM
1 of 3 series · 13 of 32 positions shown, 19 images · IV contrast (APPLIED)
Comparison: 01/28/2009 from [HOSPITAL]

CLINICAL DATA: Abnormal weight loss.

EXAM:
CT ABDOMEN AND PELVIS WITH CONTRAST
TECHNIQUE: Multidetector CT imaging of the abdomen and pelvis was performed
using the standard protocol following bolus administration of
intravenous contrast.
CONTRAST:  100mL YKRHPI-V44 IOPAMIDOL (YKRHPI-V44) INJECTION 61%

[Series 2: abd/pelvis w/cm · axial · 0.69mm/px · z∈[+553,+933]mm · 13 of 90 slices shown, 19 images]
[im 7/90  soft-tissue]
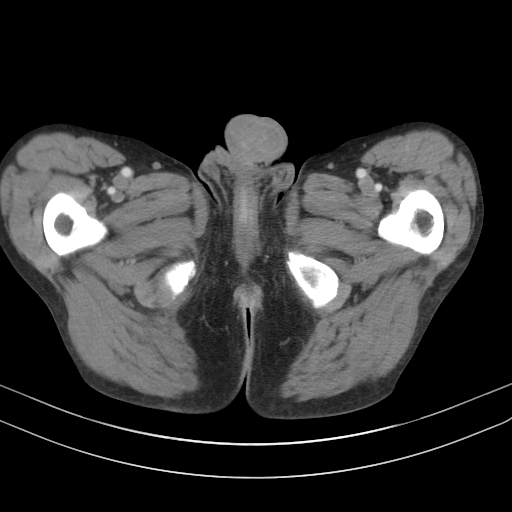
[im 7/90  bone]
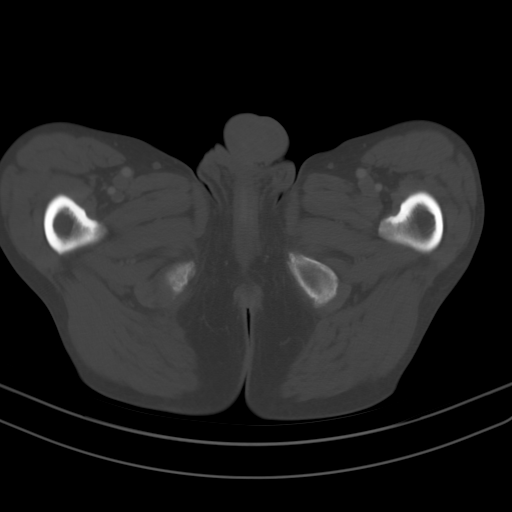
[im 13/90  soft-tissue]
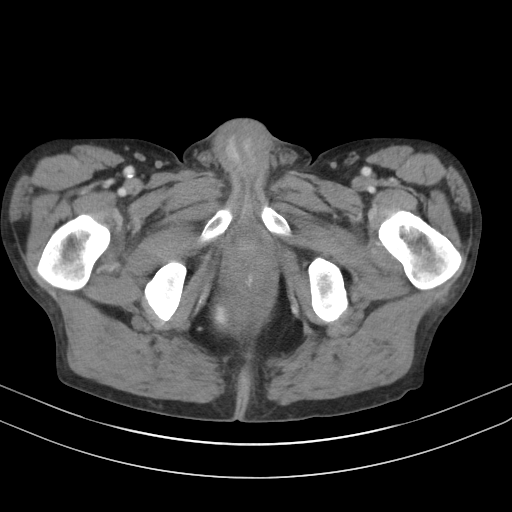
[im 20/90  soft-tissue]
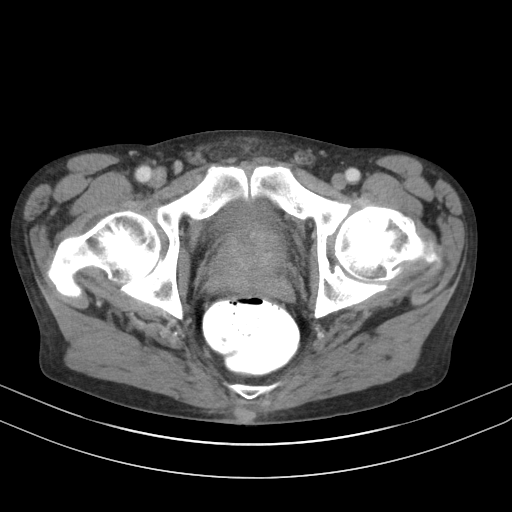
[im 26/90  soft-tissue]
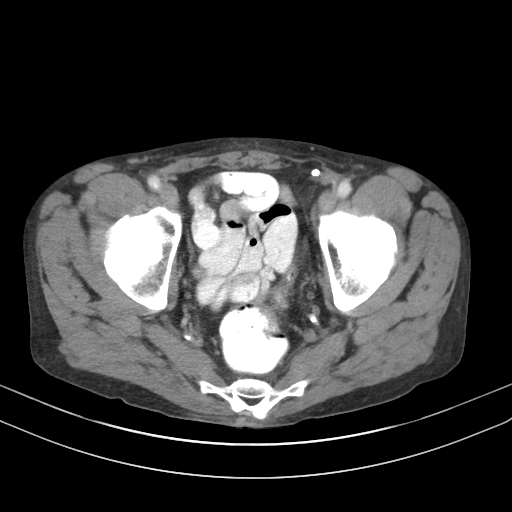
[im 32/90  soft-tissue]
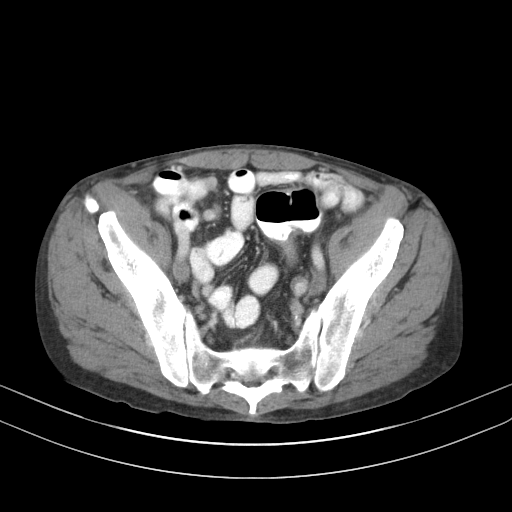
[im 39/90  soft-tissue]
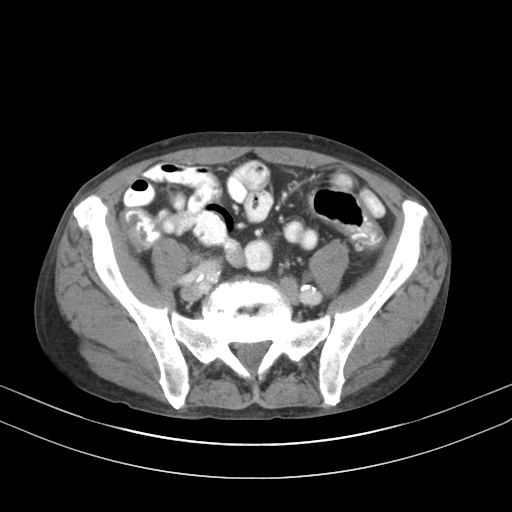
[im 45/90  soft-tissue]
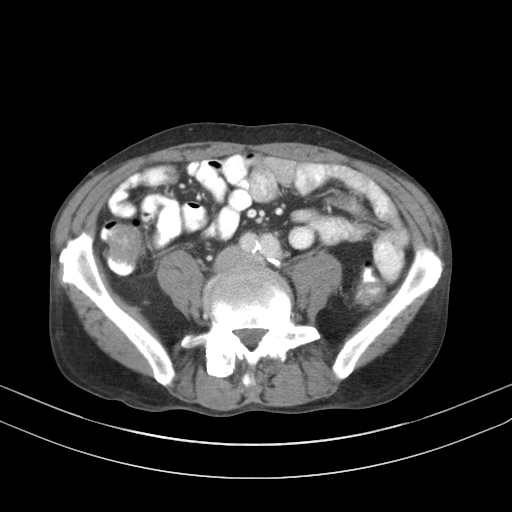
[im 51/90  soft-tissue]
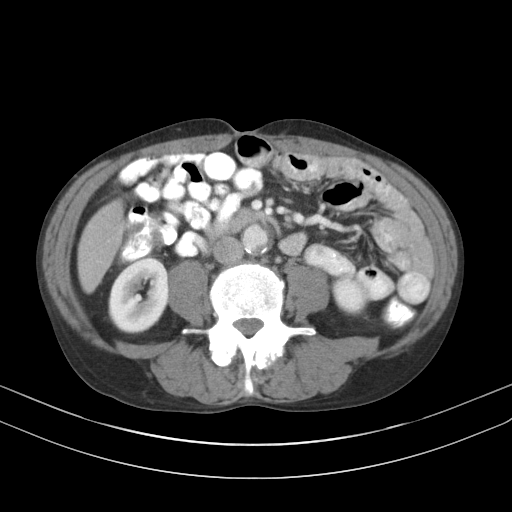
[im 58/90  soft-tissue]
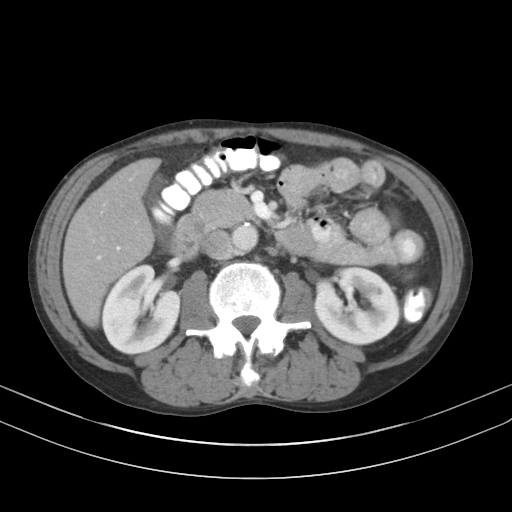
[im 58/90  bone]
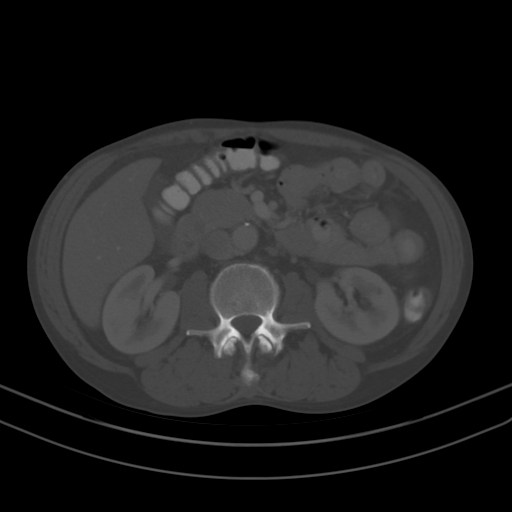
[im 64/90  soft-tissue]
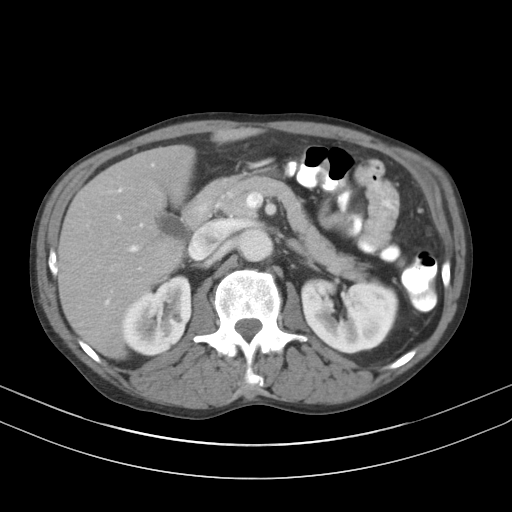
[im 64/90  lung]
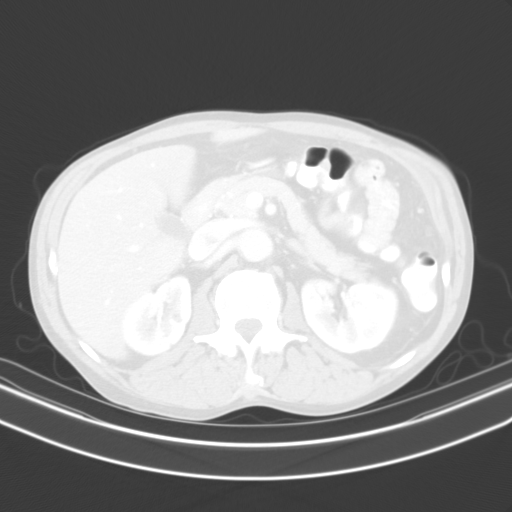
[im 70/90  soft-tissue]
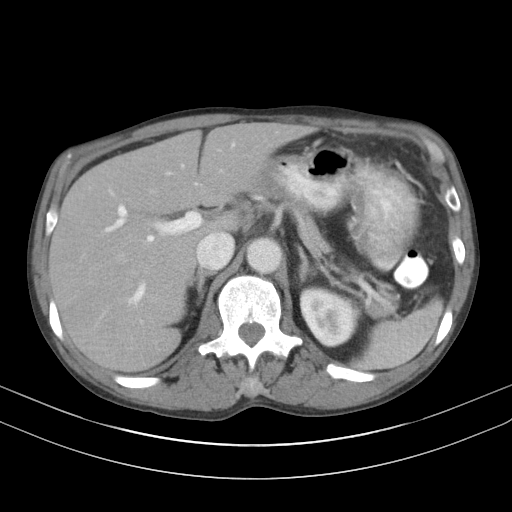
[im 70/90  lung]
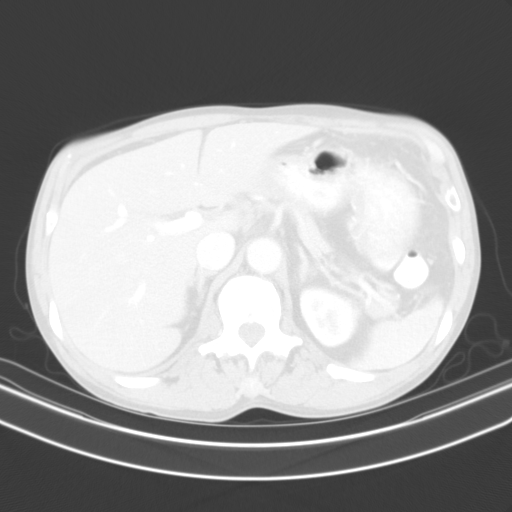
[im 77/90  soft-tissue]
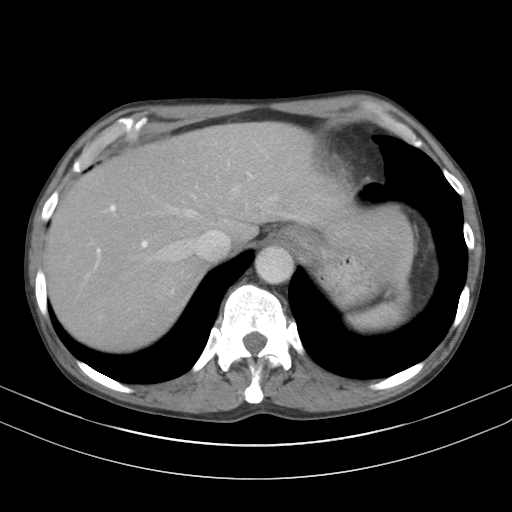
[im 77/90  lung]
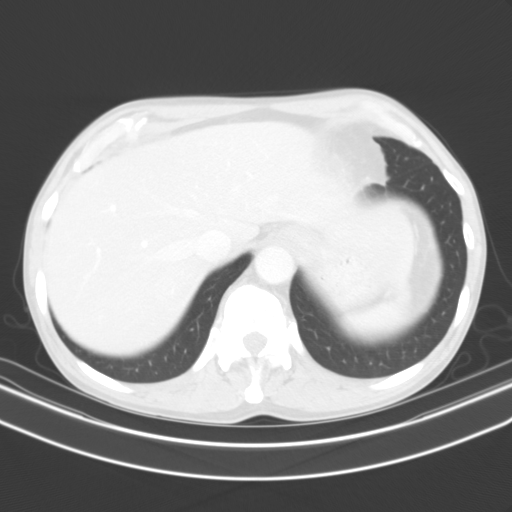
[im 83/90  soft-tissue]
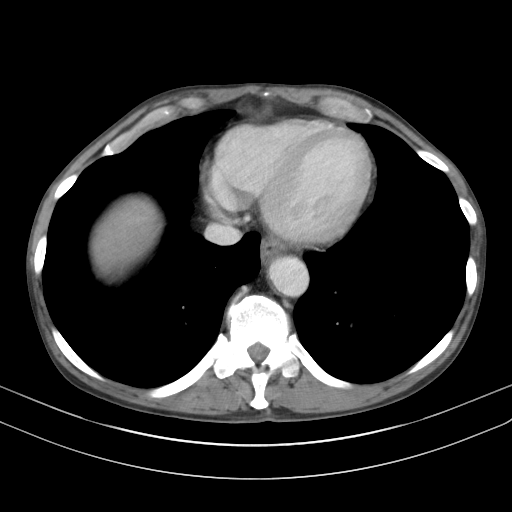
[im 83/90  lung]
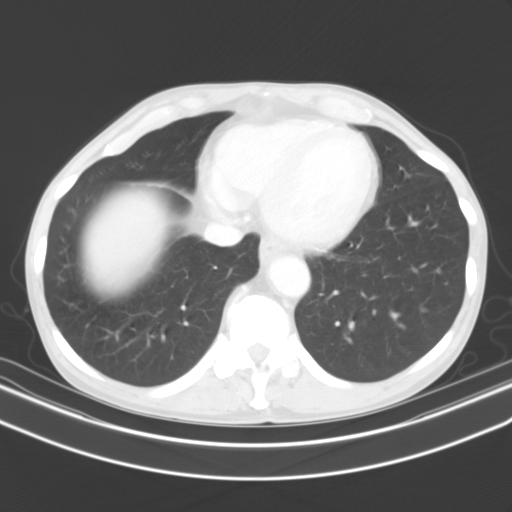

[13 of 32 positions shown; findings below may reference images not displayed]

FINDINGS: Lower Chest: No acute findings.

Hepatobiliary: No hepatic masses identified. Gallbladder is
unremarkable. No evidence of biliary ductal dilatation.

Pancreas:  No mass or inflammatory changes.

Spleen: Within normal limits in size and appearance.

Adrenals/Urinary Tract: No masses identified. No evidence of
ureteral calculi or hydronephrosis.

Stomach/Bowel: No evidence of obstruction, inflammatory process or
abnormal fluid collections.

Vascular/Lymphatic: No pathologically enlarged lymph nodes. A 3.0 cm
infrarenal abdominal aortic aneurysm is new since prior study.

Reproductive: Mildly enlarged prostate gland has increased in size
since previous study.

Other:  None.

Musculoskeletal: No suspicious bone lesions identified. Increased
moderate hip osteoarthritis noted.
IMPRESSION: 1. No evidence of abdominal or pelvic neoplasm, other acute
findings.
2. 3.0 cm infrarenal abdominal aortic aneurysm. Recommend followup
by ultrasound in 3 years. This recommendation follows ACR consensus
guidelines: White Paper of the ACR Incidental Findings Committee II
3. Mildly enlarged prostate gland.

## 2022-04-24 ENCOUNTER — Ambulatory Visit: Payer: Medicare HMO | Admitting: Podiatry

## 2022-06-20 ENCOUNTER — Ambulatory Visit: Payer: Medicare HMO | Admitting: Podiatry

## 2022-06-29 ENCOUNTER — Encounter: Payer: Self-pay | Admitting: Podiatry

## 2022-06-29 ENCOUNTER — Ambulatory Visit (INDEPENDENT_AMBULATORY_CARE_PROVIDER_SITE_OTHER): Payer: Medicare HMO | Admitting: Podiatry

## 2022-06-29 DIAGNOSIS — Q828 Other specified congenital malformations of skin: Secondary | ICD-10-CM

## 2022-07-01 NOTE — Progress Notes (Signed)
Subjective:   Patient ID: [name] [name], male   DOB: 71 y.o.   MRN: [id number]   HPI Patient presents with significant plantar lesion left painful when pressed inability to walk without extreme discomfort   ROS      Objective:  Physical Exam  Neurovascular status unchanged keratotic lesion plantar aspect left painful when pressed     Assessment:  Chronic lesion plantar left     Plan:  Debridement lesion iatrogenic bleeding reappoint routine care

## 2022-07-08 IMAGING — CR DG CHEST 2V
2 series · 2 of 2 positions shown · non-contrast
Comparison: 03/16/2020

CLINICAL DATA: Cough and congestion. Multiple previous coronary
stents.

EXAM:
CHEST - 2 VIEW

[w chest pa]
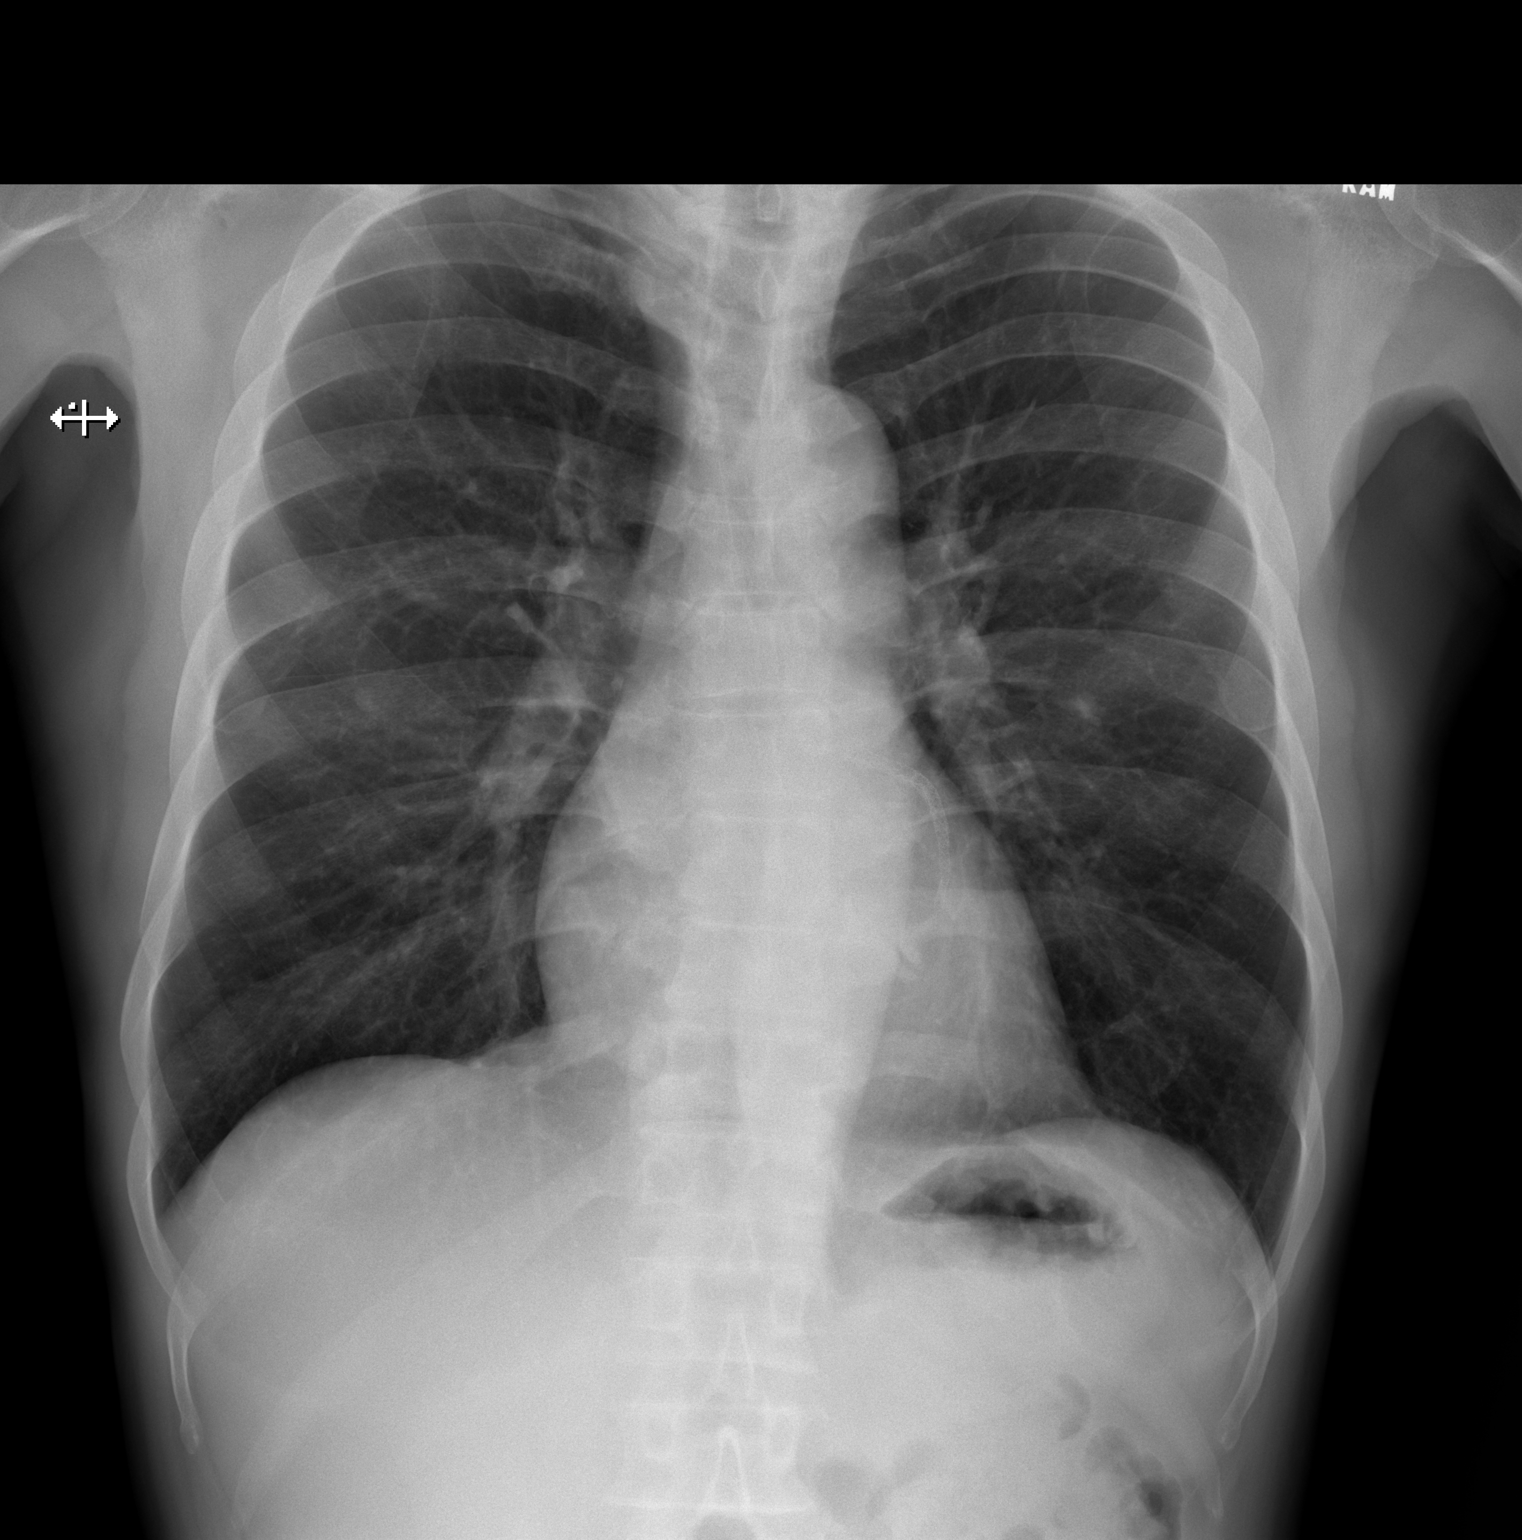

[w chest lat]
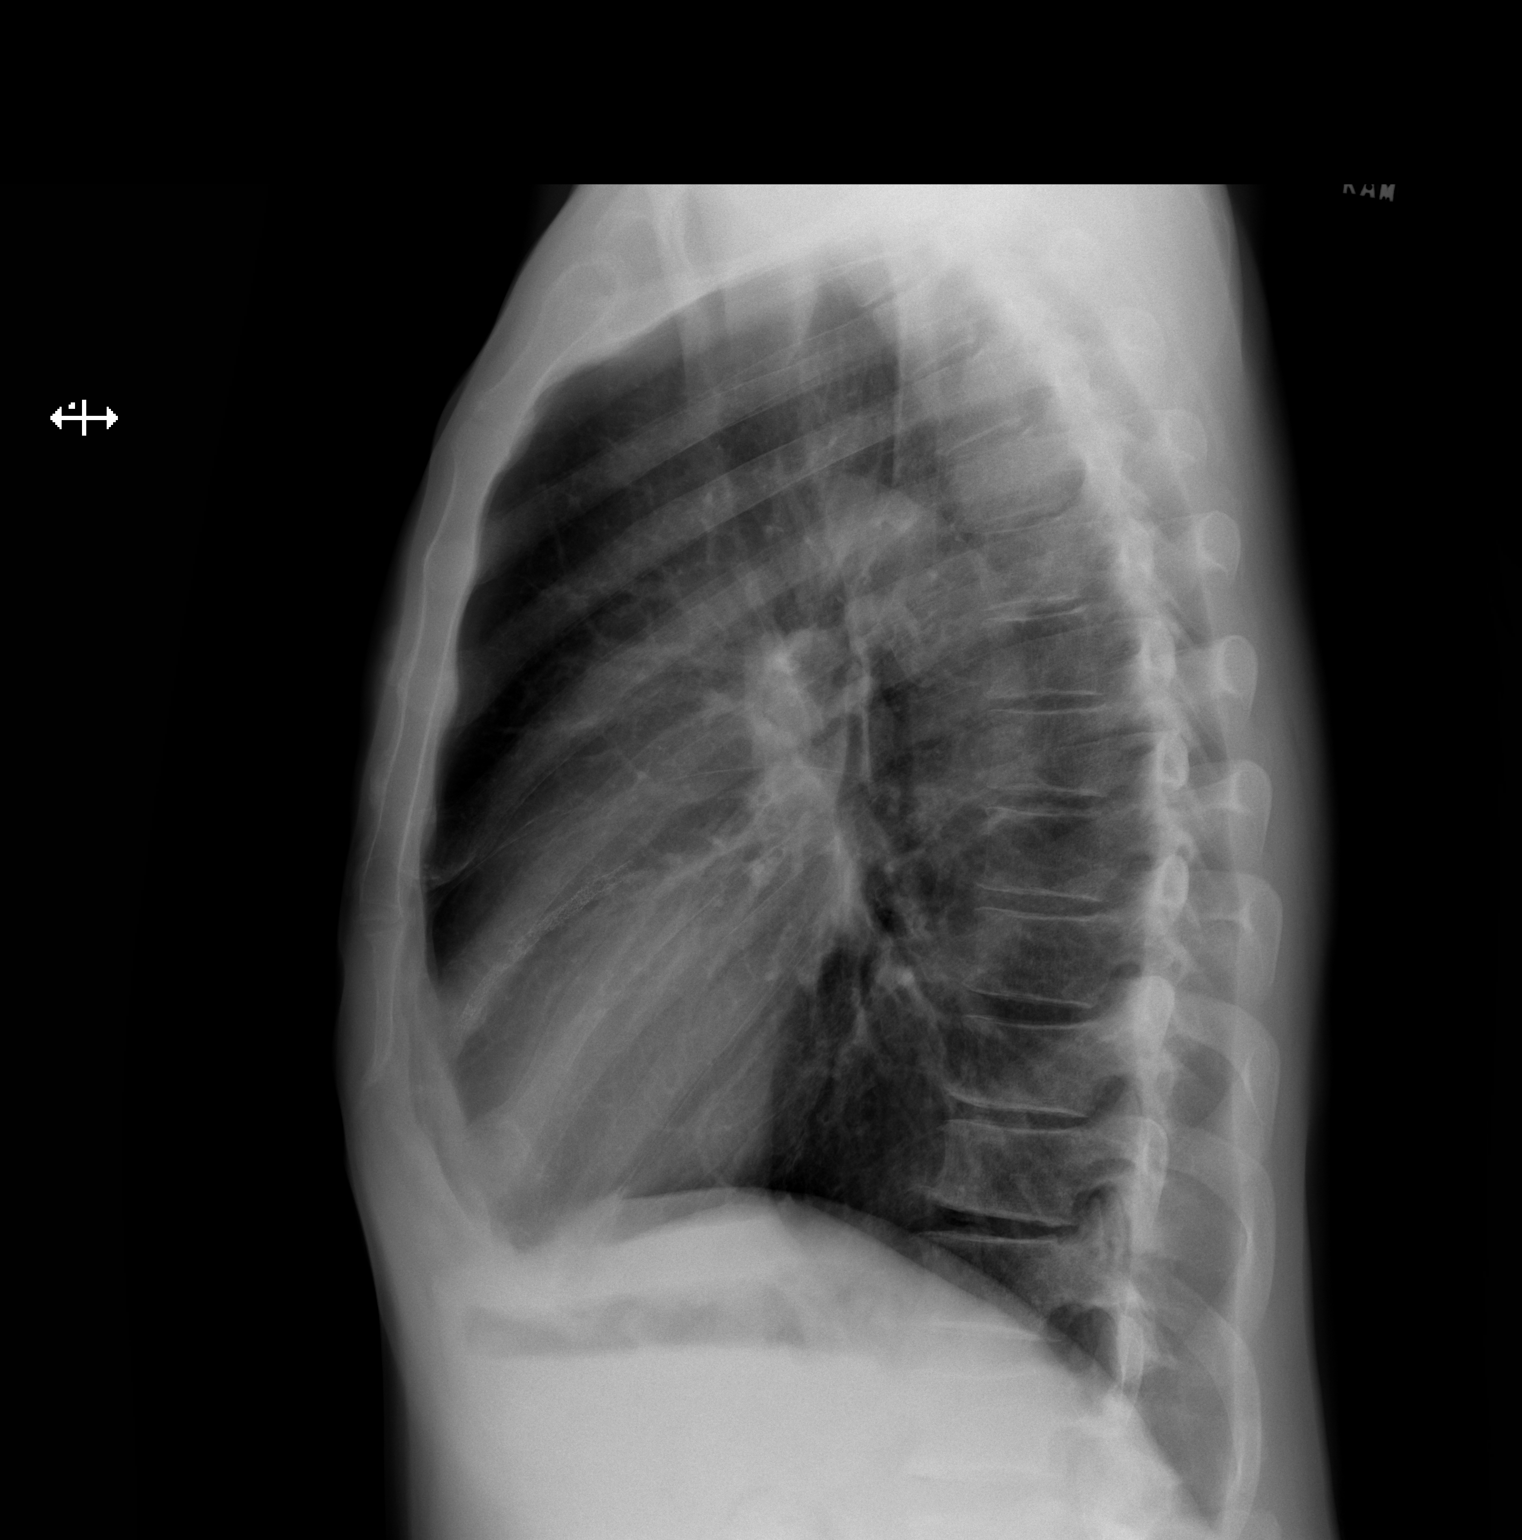

[2 of 2 positions shown; findings below may reference images not displayed]

FINDINGS: Lungs are adequately inflated and otherwise clear. Cardiomediastinal
silhouette and remainder of the exam is unchanged.
IMPRESSION: No active cardiopulmonary disease.

## 2022-11-12 IMAGING — CR DG CHEST 2V
2 series · 2 of 2 positions shown · non-contrast
Comparison: Chest x-ray 07/29/2020, CT chest 06/04/2020.

CLINICAL DATA: Ischemic congestive cardiomyopathy. Shortness of
breath for 6 months. History of hypertension and stents.

EXAM:
CHEST - 2 VIEW

[w chest pa]
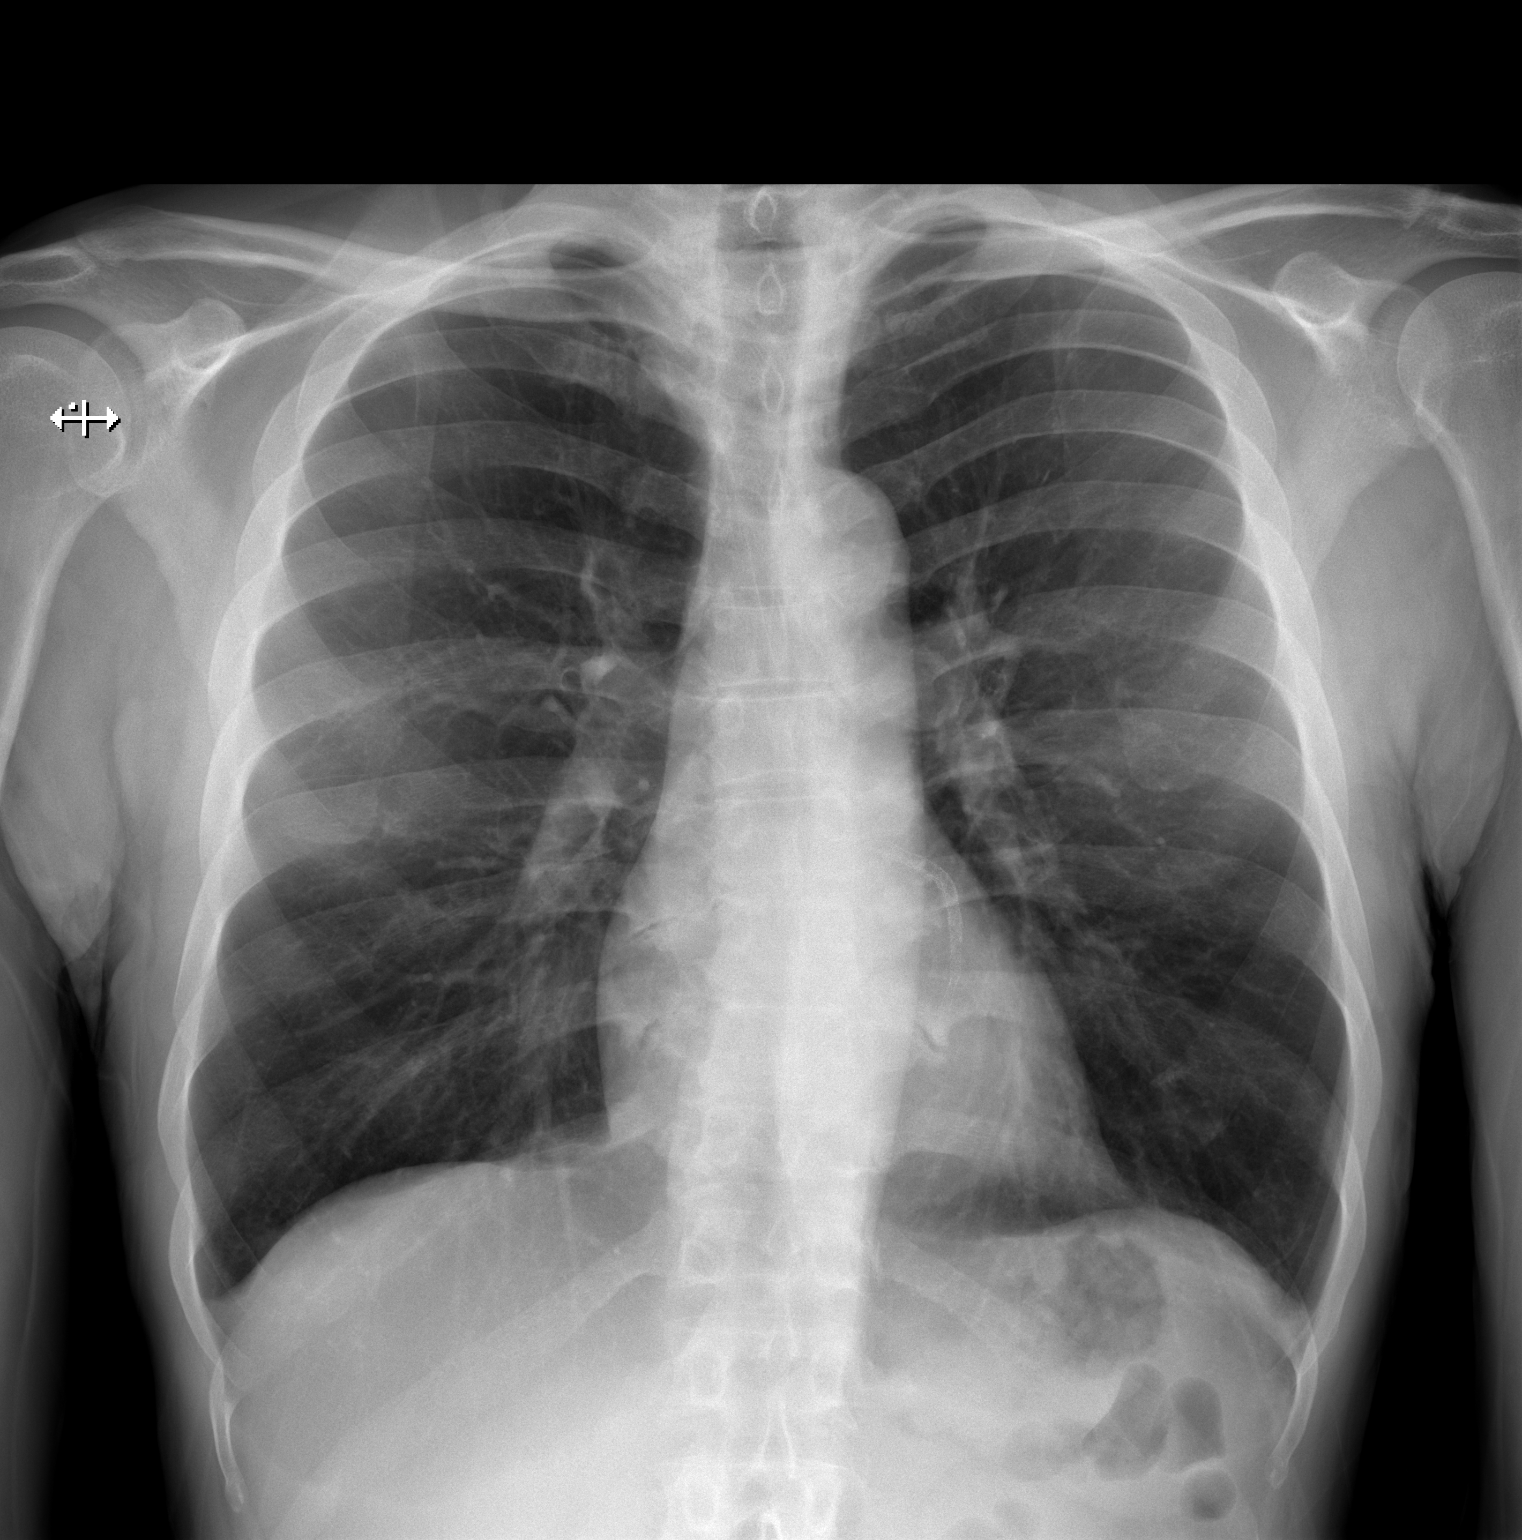

[w chest lat]
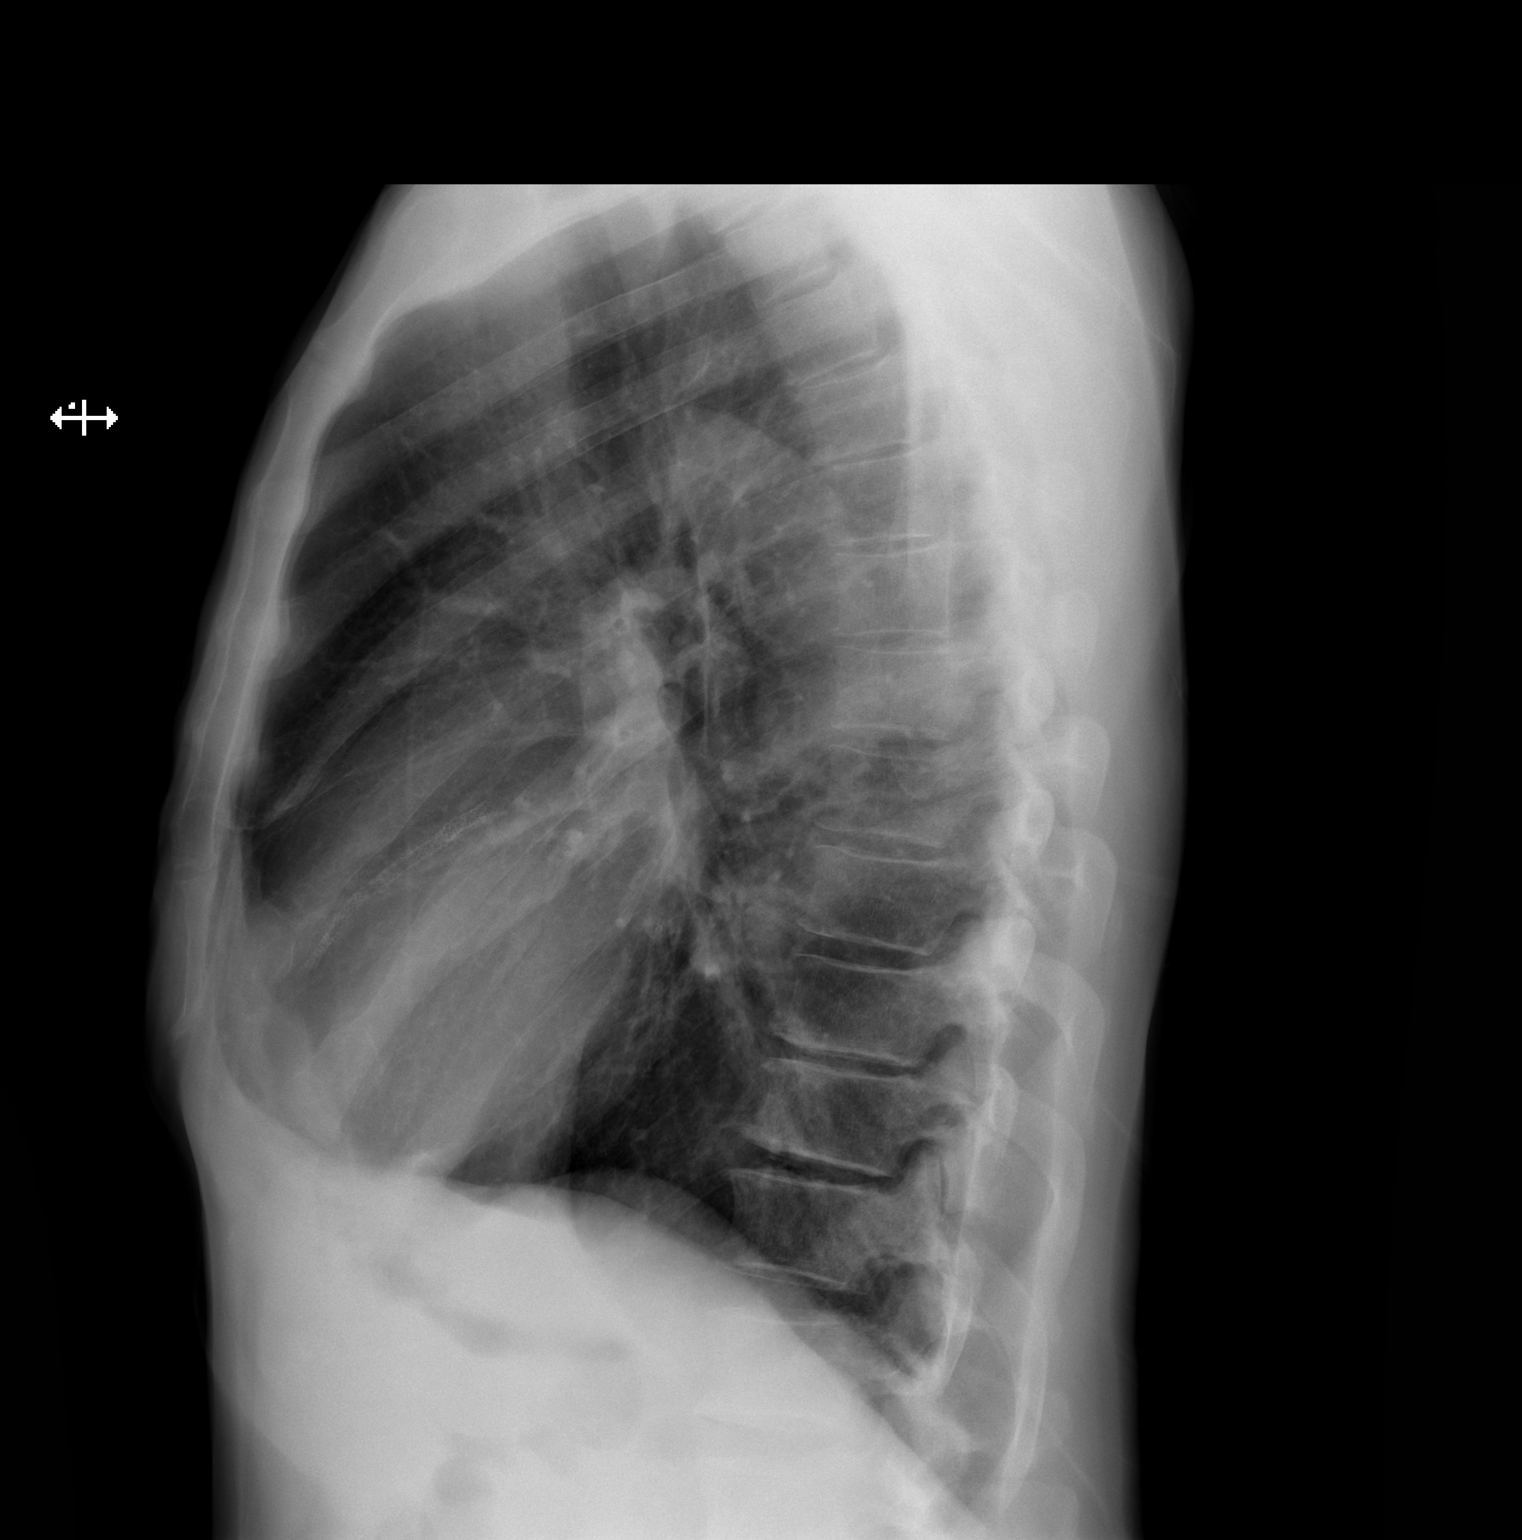

[2 of 2 positions shown; findings below may reference images not displayed]

FINDINGS: The heart size and mediastinal contours are within normal limits.
Coronary artery stent noted.

No focal consolidation. No pulmonary edema. No pleural effusion. No
pneumothorax.

No acute osseous abnormality.
IMPRESSION: No active cardiopulmonary disease.

## 2022-11-15 ENCOUNTER — Emergency Department (HOSPITAL_COMMUNITY): Payer: Medicare HMO

## 2022-11-15 ENCOUNTER — Encounter (HOSPITAL_COMMUNITY): Payer: Self-pay

## 2022-11-15 ENCOUNTER — Inpatient Hospital Stay (HOSPITAL_COMMUNITY)
Admission: EM | Admit: 2022-11-15 | Discharge: 2022-11-16 | DRG: 282 | Disposition: A | Discharge status: Home / Self Care | Payer: Medicare HMO | Attending: Internal Medicine | Admitting: Internal Medicine

## 2022-11-15 ENCOUNTER — Other Ambulatory Visit: Payer: Self-pay

## 2022-11-15 DIAGNOSIS — Z7901 Long term (current) use of anticoagulants: Secondary | ICD-10-CM

## 2022-11-15 DIAGNOSIS — I2510 Atherosclerotic heart disease of native coronary artery without angina pectoris: Secondary | ICD-10-CM

## 2022-11-15 DIAGNOSIS — Z91013 Allergy to seafood: Secondary | ICD-10-CM

## 2022-11-15 DIAGNOSIS — E876 Hypokalemia: Secondary | ICD-10-CM | POA: Diagnosis present

## 2022-11-15 DIAGNOSIS — Z955 Presence of coronary angioplasty implant and graft: Secondary | ICD-10-CM

## 2022-11-15 DIAGNOSIS — F17290 Nicotine dependence, other tobacco product, uncomplicated: Secondary | ICD-10-CM | POA: Diagnosis present

## 2022-11-15 DIAGNOSIS — Z79899 Other long term (current) drug therapy: Secondary | ICD-10-CM

## 2022-11-15 DIAGNOSIS — I214 Non-ST elevation (NSTEMI) myocardial infarction: Secondary | ICD-10-CM | POA: Diagnosis not present

## 2022-11-15 DIAGNOSIS — E7800 Pure hypercholesterolemia, unspecified: Secondary | ICD-10-CM | POA: Diagnosis present

## 2022-11-15 DIAGNOSIS — D649 Anemia, unspecified: Secondary | ICD-10-CM | POA: Diagnosis present

## 2022-11-15 DIAGNOSIS — Z7902 Long term (current) use of antithrombotics/antiplatelets: Secondary | ICD-10-CM

## 2022-11-15 DIAGNOSIS — Z7982 Long term (current) use of aspirin: Secondary | ICD-10-CM

## 2022-11-15 DIAGNOSIS — F1410 Cocaine abuse, uncomplicated: Secondary | ICD-10-CM | POA: Diagnosis present

## 2022-11-15 DIAGNOSIS — R079 Chest pain, unspecified: Secondary | ICD-10-CM | POA: Diagnosis not present

## 2022-11-15 DIAGNOSIS — D573 Sickle-cell trait: Secondary | ICD-10-CM | POA: Diagnosis present

## 2022-11-15 DIAGNOSIS — E785 Hyperlipidemia, unspecified: Secondary | ICD-10-CM | POA: Diagnosis present

## 2022-11-15 DIAGNOSIS — Z720 Tobacco use: Secondary | ICD-10-CM | POA: Diagnosis present

## 2022-11-15 DIAGNOSIS — I25110 Atherosclerotic heart disease of native coronary artery with unstable angina pectoris: Secondary | ICD-10-CM

## 2022-11-15 DIAGNOSIS — M1990 Unspecified osteoarthritis, unspecified site: Secondary | ICD-10-CM | POA: Diagnosis present

## 2022-11-15 DIAGNOSIS — I10 Essential (primary) hypertension: Secondary | ICD-10-CM | POA: Diagnosis present

## 2022-11-15 LAB — TROPONIN I (HIGH SENSITIVITY)
Troponin I (High Sensitivity): 142 ng/L (ref <18)
Troponin I (High Sensitivity): 1026 ng/L (ref <18)

## 2022-11-15 LAB — BASIC METABOLIC PANEL
Anion gap: 10 (ref 5–15)
BUN: 5 mg/dL — ABNORMAL LOW (ref 8–23)
CO2: 22 mmol/L (ref 22–32)
Calcium: 9 mg/dL (ref 8.9–10.3)
Chloride: 102 mmol/L (ref 98–111)
Creatinine, Ser: 0.8 mg/dL (ref 0.61–1.24)
GFR, Estimated: >60 mL/min (ref >60)
Glucose, Bld: 101 mg/dL — ABNORMAL HIGH (ref 70–99)
Potassium: 3.1 mmol/L — ABNORMAL LOW (ref 3.5–5.1)
Sodium: 134 mmol/L — ABNORMAL LOW (ref 135–145)

## 2022-11-15 LAB — CBC
HCT: 36.5 % — ABNORMAL LOW (ref 39.0–52.0)
Hemoglobin: 12.1 g/dL — ABNORMAL LOW (ref 13.0–17.0)
MCH: 29.5 pg (ref 26.0–34.0)
MCHC: 33.2 g/dL (ref 30.0–36.0)
MCV: 89 fL (ref 80.0–100.0)
Platelets: 168 10*3/uL (ref 150–400)
RBC: 4.1 MIL/uL — ABNORMAL LOW (ref 4.22–5.81)
RDW: 13.1 % (ref 11.5–15.5)
WBC: 8.9 10*3/uL (ref 4.0–10.5)
nRBC: 0 % (ref 0.0–0.2)

## 2022-11-15 MED ORDER — TIROFIBAN HCL IN NACL 5-0.9 MG/100ML-% IV SOLN
0.1500 ug/kg/min | INTRAVENOUS | Status: DC
  Administered 2022-11-16 (×2): 0.15 ug/kg/min via INTRAVENOUS
  Filled 2022-11-15 (×5): qty 100

## 2022-11-15 MED ORDER — HEPARIN (PORCINE) 25000 UT/250ML-% IV SOLN
900.0000 [IU]/h | INTRAVENOUS | Status: DC
  Administered 2022-11-16: 900 [IU]/h via INTRAVENOUS
  Filled 2022-11-15: qty 250

## 2022-11-15 MED ORDER — SODIUM CHLORIDE 0.9% FLUSH
3.0000 mL | Freq: Two times a day (BID) | INTRAVENOUS | Status: DC
  Administered 2022-11-16: 3 mL via INTRAVENOUS

## 2022-11-15 MED ORDER — HEPARIN BOLUS VIA INFUSION
4000.0000 [IU] | Freq: Once | INTRAVENOUS | Status: DC
  Filled 2022-11-15: qty 4000

## 2022-11-15 MED ORDER — NITROGLYCERIN IN D5W 200-5 MCG/ML-% IV SOLN: 0.0000 ug/min | INTRAVENOUS | Status: DC

## 2022-11-15 MED ORDER — TIROFIBAN (AGGRASTAT) BOLUS VIA INFUSION
25.0000 ug/kg | Freq: Once | INTRAVENOUS | Status: AC
  Administered 2022-11-16: 1847.5 ug via INTRAVENOUS
  Filled 2022-11-15: qty 37

## 2022-11-15 NOTE — ED Provider Notes (Incomplete)
[place] Provider Note   CSN: [id number] Arrival date & time: 11/15/22  1958     History {Add pertinent medical, surgical, social history, OB history to HPI:1} Chief Complaint  Patient presents with  . Chest Pain    [name] [name] is a 72 y.o. male.  HPI Patient reports he got chest pain at about 630 this evening.  He had a lot of pressure on his chest and on the left side.  He took 3 nitroglycerin and his aspirin and did not get much relief.  He also got sweaty and had pain in the left arm.  He reports he is got 5 stents and so if the nitroglycerin does not relieve his pain he called EMS.    Home Medications Prior to Admission medications   Medication Sig Start Date End Date Taking? Authorizing Provider  acetaminophen (TYLENOL) 325 MG tablet Take 2 tablets (650 mg total) by mouth every 4 (four) hours as needed for headache or mild pain. 05/11/20   Charolette Forward, MD  albuterol (VENTOLIN HFA) 108 (90 Base) MCG/ACT inhaler Inhale 2 puffs into the lungs every 4 (four) hours as needed for wheezing or shortness of breath.  07/02/19   [provider]  amLODipine (NORVASC) 2.5 MG tablet Take 1 tablet (2.5 mg total) by mouth daily. 03/27/21 03/27/22  Charolette Forward, MD  aspirin EC 81 MG EC tablet Take 1 tablet (81 mg total) by mouth daily. Swallow whole. 05/12/20   Charolette Forward, MD  atorvastatin (LIPITOR) 80 MG tablet Take 1 tablet (80 mg total) by mouth daily. 05/12/20   Charolette Forward, MD  finasteride (PROSCAR) 5 MG tablet Take 5 mg by mouth daily. 02/09/21   [provider]  Iron-FA-B Cmp-C-Biot-Probiotic (FUSION PLUS) CAPS Take 1 capsule by mouth daily. 03/17/21   [provider]  isosorbide mononitrate (IMDUR) 30 MG 24 hr tablet Take 30 mg by mouth daily. 04/06/20   [provider]  lidocaine (LIDODERM) 5 % Place 1 patch onto the skin daily as needed (pain). 12/28/20   [provider]  Menthol,  Topical Analgesic, (BIOFREEZE) 4 % GEL Apply 1 application topically 4 (four) times daily as needed (pain).     [provider]  metoprolol succinate (TOPROL-XL) 25 MG 24 hr tablet Take 1 tablet (25 mg total) by mouth daily. 03/02/17   Gildardo Cranker, DO  nitroGLYCERIN (NITROLINGUAL) 0.4 MG/SPRAY spray Place 1 spray under the tongue every 5 (five) minutes as needed for chest pain.     [provider]  pantoprazole (PROTONIX) 40 MG tablet Take 1 tablet (40 mg total) by mouth daily at 6 (six) AM. 03/28/21   Charolette Forward, MD  pramipexole (MIRAPEX) 0.125 MG tablet Take 0.125 mg by mouth at bedtime. 03/29/20   [provider]  ranolazine (RANEXA) 500 MG 12 hr tablet Take 500 mg by mouth 2 (two) times daily. 12/30/20   [provider]  tamsulosin (FLOMAX) 0.4 MG CAPS capsule Take 0.8 mg by mouth at bedtime.  05/23/18   [provider]  thiamine 100 MG tablet Take 1 tablet by mouth daily.    [provider]  ticagrelor (BRILINTA) 90 MG TABS tablet Take 90 mg by mouth daily.    [provider]  vitamin C (ASCORBIC ACID) 500 MG tablet Take 500 mg by mouth daily.    [provider]      Allergies    Shellfish-derived products    Review of  Systems   Review of Systems  Physical Exam Updated Vital Signs BP (!) 159/94   Pulse 72   Temp 98.4 F (36.9 C) (Oral)   Resp (!) 23   Ht '5\' 11"'$  (1.803 m)   Wt 73.9 kg   SpO2 95%   BMI 22.73 kg/m  Physical Exam Constitutional:      Comments: Alert nontoxic clinically well in appearance.  No distress.  HENT:     Mouth/Throat:     Pharynx: Oropharynx is clear.  Cardiovascular:     Rate and Rhythm: Normal rate and regular rhythm.  Pulmonary:     Effort: Pulmonary effort is normal.     Breath sounds: Normal breath sounds.  Abdominal:     General: There is no distension.     Palpations: Abdomen is soft.     Tenderness: There is no abdominal tenderness. There is no guarding.   Musculoskeletal:        General: No swelling. Normal range of motion.     Right lower leg: No edema.     Left lower leg: No edema.  Skin:    General: Skin is warm and dry.  Neurological:     General: No focal deficit present.     Mental Status: He is oriented to person, place, and time.     Motor: No weakness.  Psychiatric:        Mood and Affect: Mood normal.     ED Results / Procedures / Treatments   Labs (all labs ordered are listed, but only abnormal results are displayed) Labs Reviewed  BASIC METABOLIC PANEL - Abnormal; Notable for the following components:      Result Value   Sodium 134 (*)    Potassium 3.1 (*)    Glucose, Bld 101 (*)    BUN 5 (*)    All other components within normal limits  CBC - Abnormal; Notable for the following components:   RBC 4.10 (*)    Hemoglobin 12.1 (*)    HCT 36.5 (*)    All other components within normal limits  TROPONIN I (HIGH SENSITIVITY) - Abnormal; Notable for the following components:   Troponin I (High Sensitivity) 142 (*)    All other components within normal limits  TROPONIN I (HIGH SENSITIVITY) - Abnormal; Notable for the following components:   Troponin I (High Sensitivity) 1,026 (*)    All other components within normal limits  HEPARIN LEVEL (UNFRACTIONATED)  CBC    EKG EKG Interpretation  Date/Time:  Wednesday November 15 2022 20:58:59 EST Ventricular Rate:  71 PR Interval:  134 QRS Duration: 90 QT Interval:  387 QTC Calculation: 421 R Axis:   61 Text Interpretation: Sinus rhythm Probable left atrial enlargement ST elevation, consider anterior injury no sig change from previous Confirmed by Charlesetta Shanks [phone]) on 11/15/2022 9:52:13 PM  Radiology DG Chest 2 View  Result Date: 11/15/2022 CLINICAL DATA:  Chest pain. EXAM: CHEST - 2 VIEW COMPARISON:  March 25, 2021 FINDINGS: The heart size and mediastinal contours are within normal limits. A coronary artery stent is seen. Both lungs are clear. The visualized  skeletal structures are unremarkable. IMPRESSION: No active cardiopulmonary disease. Electronically Signed   By: Virgina Norfolk M.D.   On: 11/15/2022 21:24    Procedures Procedures  {Document cardiac monitor, telemetry assessment procedure when appropriate:1} CRITICAL CARE Performed by: Charlesetta Shanks   Total critical care time: 30 minutes  Critical care time was exclusive of separately billable procedures and treating other  patients.  Critical care was necessary to treat or prevent imminent or life-threatening deterioration.  Critical care was time spent personally by me on the following activities: development of treatment plan with patient and/or surrogate as well as nursing, discussions with consultants, evaluation of patient's response to treatment, examination of patient, obtaining history from patient or surrogate, ordering and performing treatments and interventions, ordering and review of laboratory studies, ordering and review of radiographic studies, pulse oximetry and re-evaluation of patient's condition.  Medications Ordered in ED Medications  tirofiban (AGGRASTAT) bolus via infusion 1,847.5 mcg (has no administration in time range)  tirofiban (AGGRASTAT) infusion 50 mcg/mL 100 mL (has no administration in time range)  sodium chloride flush (NS) 0.9 % injection 3 mL (3 mLs Intravenous Not Given 11/15/22 2334)  heparin ADULT infusion 100 units/mL (25000 units/295m) (has no administration in time range)    ED Course/ Medical Decision Making/ A&P   {   Click here for ABCD2, HEART and other calculatorsREFRESH Note before signing :1}                          Medical Decision Making Amount and/or Complexity of Data Reviewed Labs: ordered. Radiology: ordered.  Risk Prescription drug management.  Presents with chest pain with known history of coronary artery disease.  Typical pain for cardiac ischemia.  Patient is not having active pain at time of arrival, vital signs are  stable.  Will proceed with diagnostic evaluation.  EKG does not show acute ischemic pattern present.  Troponin is elevated greater than 400.  With typical history suspect ACS.  Patient has had aspirin prior to arrival.  Consult: Reviewed with Dr. HTerrence Dupont  Quested patient be started on heparin and nitroglycerin.  He will see the patient in the emergency department.  Dr. HTerrence Dupontsubsequently advised that he is having Dr. GEinar Gipdo consult. Consult: Dr. GEinar Gip  Reviewed patient's case.  He request patient be started on heparin and Aggrastat per pharmacy protocol.  No nitroglycerin as the patient is currently pain-free.  N.p.o. after midnight with plan for catheterization tomorrow.  He requests patient be admitted to medical service.   {Document critical care time when appropriate:1} {Document review of labs and clinical decision tools ie heart score, Chads2Vasc2 etc:1}  {Document your independent review of radiology images, and any outside records:1} {Document your discussion with family members, caretakers, and with consultants:1} {Document social determinants of health affecting pt's care:1} {Document your decision making why or why not admission, treatments were needed:1} Final Clinical Impression(s) / ED Diagnoses Final diagnoses:  [place]    Rx / DC Orders ED Discharge Orders     None

## 2022-11-15 NOTE — Subjective & Objective (Signed)
Patient presents with chest pain radiates down her left arm associated shortness of breath and diaphoresis started at 630 tonight she took 4 baby aspirin's and sublingual nitro which has resolved her pain she has known history of CAD with cardiac stents

## 2022-11-15 NOTE — ED Provider Notes (Signed)
[facility] EMERGENCY DEPARTMENT AT [facility] Provider Note   CSN: [id number] Arrival date & time: 11/15/22  1958     History  Chief Complaint  Patient presents with   Chest Pain    [name] [name] is a 72 y.o. male.  HPI Patient reports he got chest pain at about 630 this evening.  He had a lot of pressure on his chest and on the left side.  He took 3 nitroglycerin and his aspirin and did not get much relief.  He also got sweaty and had pain in the left arm.  He reports he is got 5 stents and so if the nitroglycerin does not relieve his pain he called EMS.    Home Medications Prior to Admission medications   Medication Sig Start Date End Date Taking? Authorizing Provider  acetaminophen (TYLENOL) 325 MG tablet Take 2 tablets (650 mg total) by mouth every 4 (four) hours as needed for headache or mild pain. 05/11/20   Rinaldo Cloud, MD  albuterol (VENTOLIN HFA) 108 (90 Base) MCG/ACT inhaler Inhale 2 puffs into the lungs every 4 (four) hours as needed for wheezing or shortness of breath.  07/02/19   [provider]  amLODipine (NORVASC) 2.5 MG tablet Take 1 tablet (2.5 mg total) by mouth daily. 03/27/21 03/27/22  Rinaldo Cloud, MD  aspirin EC 81 MG EC tablet Take 1 tablet (81 mg total) by mouth daily. Swallow whole. 05/12/20   Rinaldo Cloud, MD  atorvastatin (LIPITOR) 80 MG tablet Take 1 tablet (80 mg total) by mouth daily. 05/12/20   Rinaldo Cloud, MD  finasteride (PROSCAR) 5 MG tablet Take 5 mg by mouth daily. 02/09/21   [provider]  Iron-FA-B Cmp-C-Biot-Probiotic (FUSION PLUS) CAPS Take 1 capsule by mouth daily. 03/17/21   [provider]  isosorbide mononitrate (IMDUR) 30 MG 24 hr tablet Take 30 mg by mouth daily. 04/06/20   [provider]  lidocaine (LIDODERM) 5 % Place 1 patch onto the skin daily as needed (pain). 12/28/20   [provider]  Menthol, Topical Analgesic, (BIOFREEZE) 4 % GEL Apply 1 application topically 4  (four) times daily as needed (pain).     [provider]  metoprolol succinate (TOPROL-XL) 25 MG 24 hr tablet Take 1 tablet (25 mg total) by mouth daily. 03/02/17   Kirt Boys, DO  nitroGLYCERIN (NITROLINGUAL) 0.4 MG/SPRAY spray Place 1 spray under the tongue every 5 (five) minutes as needed for chest pain.     [provider]  pantoprazole (PROTONIX) 40 MG tablet Take 1 tablet (40 mg total) by mouth daily at 6 (six) AM. 03/28/21   Rinaldo Cloud, MD  pramipexole (MIRAPEX) 0.125 MG tablet Take 0.125 mg by mouth at bedtime. 03/29/20   [provider]  ranolazine (RANEXA) 500 MG 12 hr tablet Take 500 mg by mouth 2 (two) times daily. 12/30/20   [provider]  tamsulosin (FLOMAX) 0.4 MG CAPS capsule Take 0.8 mg by mouth at bedtime.  05/23/18   [provider]  thiamine 100 MG tablet Take 1 tablet by mouth daily.    [provider]  ticagrelor (BRILINTA) 90 MG TABS tablet Take 90 mg by mouth daily.    [provider]  vitamin C (ASCORBIC ACID) 500 MG tablet Take 500 mg by mouth daily.    [provider]      Allergies    Shellfish-derived products    Review of Systems   Review of Systems  Physical Exam  Updated Vital Signs BP (!) 159/94   Pulse 72   Temp 98.4 F (36.9 C) (Oral)   Resp (!) 23   Ht 5\' 11"  (1.803 m)   Wt 73.9 kg   SpO2 95%   BMI 22.73 kg/m  Physical Exam Constitutional:      Comments: Alert nontoxic clinically well in appearance.  No distress.  HENT:     Mouth/Throat:     Pharynx: Oropharynx is clear.  Cardiovascular:     Rate and Rhythm: Normal rate and regular rhythm.  Pulmonary:     Effort: Pulmonary effort is normal.     Breath sounds: Normal breath sounds.  Abdominal:     General: There is no distension.     Palpations: Abdomen is soft.     Tenderness: There is no abdominal tenderness. There is no guarding.  Musculoskeletal:        General: No swelling. Normal range of motion.     Right  lower leg: No edema.     Left lower leg: No edema.  Skin:    General: Skin is warm and dry.  Neurological:     General: No focal deficit present.     Mental Status: He is oriented to person, place, and time.     Motor: No weakness.  Psychiatric:        Mood and Affect: Mood normal.     ED Results / Procedures / Treatments   Labs (all labs ordered are listed, but only abnormal results are displayed) Labs Reviewed  BASIC METABOLIC PANEL - Abnormal; Notable for the following components:      Result Value   Sodium 134 (*)    Potassium 3.1 (*)    Glucose, Bld 101 (*)    BUN 5 (*)    All other components within normal limits  CBC - Abnormal; Notable for the following components:   RBC 4.10 (*)    Hemoglobin 12.1 (*)    HCT 36.5 (*)    All other components within normal limits  TROPONIN I (HIGH SENSITIVITY) - Abnormal; Notable for the following components:   Troponin I (High Sensitivity) 142 (*)    All other components within normal limits  TROPONIN I (HIGH SENSITIVITY) - Abnormal; Notable for the following components:   Troponin I (High Sensitivity) 1,026 (*)    All other components within normal limits  HEPARIN LEVEL (UNFRACTIONATED)  CBC    EKG EKG Interpretation  Date/Time:  Wednesday November 15 2022 20:58:59 EST Ventricular Rate:  71 PR Interval:  134 QRS Duration: 90 QT Interval:  387 QTC Calculation: 421 R Axis:   61 Text Interpretation: Sinus rhythm Probable left atrial enlargement ST elevation, consider anterior injury no sig change from previous Confirmed by Arby Barrette [phone]) on 11/15/2022 9:52:13 PM  Radiology DG Chest 2 View  Result Date: 11/15/2022 CLINICAL DATA:  Chest pain. EXAM: CHEST - 2 VIEW COMPARISON:  March 25, 2021 FINDINGS: The heart size and mediastinal contours are within normal limits. A coronary artery stent is seen. Both lungs are clear. The visualized skeletal structures are unremarkable. IMPRESSION: No active cardiopulmonary disease.  Electronically Signed   By: Aram Candela M.D.   On: 11/15/2022 21:24    Procedures Procedures   CRITICAL CARE Performed by: Arby Barrette   Total critical care time: 30 minutes  Critical care time was exclusive of separately billable procedures and treating other patients.  Critical care was necessary to treat or prevent imminent or life-threatening deterioration.  Critical  care was time spent personally by me on the following activities: development of treatment plan with patient and/or surrogate as well as nursing, discussions with consultants, evaluation of patient's response to treatment, examination of patient, obtaining history from patient or surrogate, ordering and performing treatments and interventions, ordering and review of laboratory studies, ordering and review of radiographic studies, pulse oximetry and re-evaluation of patient's condition.  Medications Ordered in ED Medications  tirofiban (AGGRASTAT) bolus via infusion 1,847.5 mcg (has no administration in time range)  tirofiban (AGGRASTAT) infusion 50 mcg/mL 100 mL (has no administration in time range)  sodium chloride flush (NS) 0.9 % injection 3 mL (3 mLs Intravenous Not Given 11/15/22 2334)  heparin ADULT infusion 100 units/mL (25000 units/221mL) (has no administration in time range)    ED Course/ Medical Decision Making/ A&P                             Medical Decision Making Amount and/or Complexity of Data Reviewed Labs: ordered. Radiology: ordered.  Risk Prescription drug management.  Presents with chest pain with known history of coronary artery disease.  Typical pain for cardiac ischemia.  Patient is not having active pain at time of arrival, vital signs are stable.  Will proceed with diagnostic evaluation.  EKG does not show acute ischemic pattern present.  Troponin is elevated greater than 400.  With typical history suspect ACS.  Patient has had aspirin prior to arrival.  Consult: Reviewed  with Dr. Sharyn Lull.  Quested patient be started on heparin and nitroglycerin.  He will see the patient in the emergency department.  Dr. Sharyn Lull subsequently advised that he is having Dr. Jacinto Halim do consult. Consult: Dr. Jacinto Halim.  Reviewed patient's case.  He request patient be started on heparin and Aggrastat per pharmacy protocol.  No nitroglycerin as the patient is currently pain-free.  N.p.o. after midnight with plan for catheterization tomorrow.  He requests patient be admitted to medical service. Consult: Dr. Felipa Furnace for admission.         Final Clinical Impression(s) / ED Diagnoses Final diagnoses:  NSTEMI (non-ST elevated myocardial infarction) [facility])    Rx / DC Orders ED Discharge Orders     None         Arby Barrette, MD 11/16/22 0005

## 2022-11-15 NOTE — ED Notes (Signed)
Patient transported to X-ray 

## 2022-11-15 NOTE — Progress Notes (Signed)
ANTICOAGULATION CONSULT NOTE - Initial Consult  Pharmacy Consult for heparin Indication: chest pain/ACS  Allergies  Allergen Reactions   Shellfish-Derived Products Anaphylaxis, Shortness Of Breath and Other (See Comments)    "My throat goes numb"    Patient Measurements: Height: '5\' 11"'$  (180.3 cm) Weight: 73.9 kg (163 lb) IBW/kg (Calculated) : 75.3 Heparin Dosing Weight: TBW  Vital Signs: Temp: 98.4 F (36.9 C) (02/28 2006) Temp Source: Oral (02/28 2006) BP: 119/79 (02/28 2006) Pulse Rate: 95 (02/28 2006)  Labs: Recent Labs    11/15/22 2029 11/15/22 2207  HGB 12.1*  --   HCT 36.5*  --   PLT 168  --   CREATININE 0.80  --   TROPONINIHS 142* 1,026*    Estimated Creatinine Clearance: 91.1 mL/min (by C-G formula based on SCr of 0.8 mg/dL).   Medical History: Past Medical History:  Diagnosis Date   Arthritis    Cataracts, bilateral    Hyperlipidemia    Hypertension    Seasonal allergies    Sickle cell trait (HCC)     Assessment: 15 YOM presenting with CP and SOB, hx CAD, he is not on anticoagulation PTA, chronic anemia stable, plts wnl  Goal of Therapy:  Heparin level 0.3-0.7 units/ml Monitor platelets by anticoagulation protocol: Yes   Plan:  Heparin 4000 units IV x 1, then gtt at 900 units/hr F/u heparin level with AM labs F/u cards eval and recs  Bertis Ruddy, PharmD, [place] Pharmacist ED Pharmacist Phone # [phone] 11/15/2022 11:17 PM

## 2022-11-15 NOTE — ED Triage Notes (Signed)
PER EMS: pt is from home with c/o generalized chest pain described as pressure that radiates down his left arm associated with SOB and an episode of diaphoresis with EMS, onset tonight at 1830. He took 4 baby aspirin at home. EMS adm 1 SL nitro which resolved his pain. Pt reports he has a hx of cardiac stents.  BP- 158/96, HR- 86, O2-100%, cbg-86

## 2022-11-16 ENCOUNTER — Encounter (HOSPITAL_COMMUNITY)
Admission: EM | Disposition: A | Discharge status: Home / Self Care | Payer: Self-pay | Source: Home / Self Care | Attending: Internal Medicine

## 2022-11-16 ENCOUNTER — Inpatient Hospital Stay (HOSPITAL_COMMUNITY): Payer: Medicare HMO

## 2022-11-16 ENCOUNTER — Other Ambulatory Visit (HOSPITAL_COMMUNITY): Payer: Self-pay

## 2022-11-16 ENCOUNTER — Encounter (HOSPITAL_COMMUNITY): Payer: Self-pay | Admitting: Internal Medicine

## 2022-11-16 DIAGNOSIS — Z720 Tobacco use: Secondary | ICD-10-CM | POA: Diagnosis present

## 2022-11-16 DIAGNOSIS — Z7902 Long term (current) use of antithrombotics/antiplatelets: Secondary | ICD-10-CM | POA: Diagnosis not present

## 2022-11-16 DIAGNOSIS — E782 Mixed hyperlipidemia: Secondary | ICD-10-CM | POA: Diagnosis not present

## 2022-11-16 DIAGNOSIS — Z7982 Long term (current) use of aspirin: Secondary | ICD-10-CM | POA: Diagnosis not present

## 2022-11-16 DIAGNOSIS — I10 Essential (primary) hypertension: Secondary | ICD-10-CM | POA: Diagnosis present

## 2022-11-16 DIAGNOSIS — I25110 Atherosclerotic heart disease of native coronary artery with unstable angina pectoris: Secondary | ICD-10-CM

## 2022-11-16 DIAGNOSIS — I214 Non-ST elevation (NSTEMI) myocardial infarction: Principal | ICD-10-CM | POA: Diagnosis present

## 2022-11-16 DIAGNOSIS — F1410 Cocaine abuse, uncomplicated: Secondary | ICD-10-CM | POA: Diagnosis present

## 2022-11-16 DIAGNOSIS — Z79899 Other long term (current) drug therapy: Secondary | ICD-10-CM | POA: Diagnosis not present

## 2022-11-16 DIAGNOSIS — Z7901 Long term (current) use of anticoagulants: Secondary | ICD-10-CM | POA: Diagnosis not present

## 2022-11-16 DIAGNOSIS — M1990 Unspecified osteoarthritis, unspecified site: Secondary | ICD-10-CM | POA: Diagnosis present

## 2022-11-16 DIAGNOSIS — Z91013 Allergy to seafood: Secondary | ICD-10-CM | POA: Diagnosis not present

## 2022-11-16 DIAGNOSIS — R079 Chest pain, unspecified: Secondary | ICD-10-CM | POA: Diagnosis present

## 2022-11-16 DIAGNOSIS — D573 Sickle-cell trait: Secondary | ICD-10-CM | POA: Diagnosis present

## 2022-11-16 DIAGNOSIS — E7800 Pure hypercholesterolemia, unspecified: Secondary | ICD-10-CM | POA: Diagnosis present

## 2022-11-16 DIAGNOSIS — E785 Hyperlipidemia, unspecified: Secondary | ICD-10-CM | POA: Diagnosis present

## 2022-11-16 DIAGNOSIS — Z955 Presence of coronary angioplasty implant and graft: Secondary | ICD-10-CM | POA: Diagnosis not present

## 2022-11-16 DIAGNOSIS — E876 Hypokalemia: Secondary | ICD-10-CM | POA: Diagnosis present

## 2022-11-16 DIAGNOSIS — I2510 Atherosclerotic heart disease of native coronary artery without angina pectoris: Secondary | ICD-10-CM

## 2022-11-16 DIAGNOSIS — D649 Anemia, unspecified: Secondary | ICD-10-CM | POA: Diagnosis present

## 2022-11-16 DIAGNOSIS — F17290 Nicotine dependence, other tobacco product, uncomplicated: Secondary | ICD-10-CM | POA: Diagnosis present

## 2022-11-16 HISTORY — PX: LEFT HEART CATH AND CORONARY ANGIOGRAPHY: CATH118249

## 2022-11-16 LAB — D-DIMER, QUANTITATIVE: D-Dimer, Quant: 1.61 ug/mL-FEU — ABNORMAL HIGH (ref 0.00–0.50)

## 2022-11-16 LAB — CBC WITH DIFFERENTIAL/PLATELET
Abs Immature Granulocytes: 0.03 10*3/uL (ref 0.00–0.07)
Basophils Absolute: 0.1 10*3/uL (ref 0.0–0.1)
Basophils Relative: 1 %
Eosinophils Absolute: 0.4 10*3/uL (ref 0.0–0.5)
Eosinophils Relative: 5 %
HCT: 35.5 % — ABNORMAL LOW (ref 39.0–52.0)
Hemoglobin: 11.8 g/dL — ABNORMAL LOW (ref 13.0–17.0)
Immature Granulocytes: 0 %
Lymphocytes Relative: 20 %
Lymphs Abs: 1.7 10*3/uL (ref 0.7–4.0)
MCH: 28.6 pg (ref 26.0–34.0)
MCHC: 33.2 g/dL (ref 30.0–36.0)
MCV: 86 fL (ref 80.0–100.0)
Monocytes Absolute: 0.7 10*3/uL (ref 0.1–1.0)
Monocytes Relative: 9 %
Neutro Abs: 5.4 10*3/uL (ref 1.7–7.7)
Neutrophils Relative %: 65 %
Platelets: 169 10*3/uL (ref 150–400)
RBC: 4.13 MIL/uL — ABNORMAL LOW (ref 4.22–5.81)
RDW: 12.9 % (ref 11.5–15.5)
WBC: 8.2 10*3/uL (ref 4.0–10.5)
nRBC: 0 % (ref 0.0–0.2)

## 2022-11-16 LAB — RAPID URINE DRUG SCREEN, HOSP PERFORMED
Amphetamines: NOT DETECTED
Barbiturates: NOT DETECTED
Benzodiazepines: NOT DETECTED
Cocaine: POSITIVE — AB
Opiates: NOT DETECTED
Tetrahydrocannabinol: NOT DETECTED

## 2022-11-16 LAB — HEPATIC FUNCTION PANEL
ALT: 21 U/L (ref 0–44)
AST: 38 U/L (ref 15–41)
Albumin: 3.7 g/dL (ref 3.5–5.0)
Alkaline Phosphatase: 80 U/L (ref 38–126)
Bilirubin, Direct: 0.1 mg/dL (ref 0.0–0.2)
Indirect Bilirubin: 0.7 mg/dL (ref 0.3–0.9)
Total Bilirubin: 0.8 mg/dL (ref 0.3–1.2)
Total Protein: 7.4 g/dL (ref 6.5–8.1)

## 2022-11-16 LAB — COMPREHENSIVE METABOLIC PANEL
ALT: 20 U/L (ref 0–44)
AST: 39 U/L (ref 15–41)
Albumin: 3.7 g/dL (ref 3.5–5.0)
Alkaline Phosphatase: 77 U/L (ref 38–126)
Anion gap: 9 (ref 5–15)
BUN: 8 mg/dL (ref 8–23)
CO2: 25 mmol/L (ref 22–32)
Calcium: 8.9 mg/dL (ref 8.9–10.3)
Chloride: 104 mmol/L (ref 98–111)
Creatinine, Ser: 0.84 mg/dL (ref 0.61–1.24)
GFR, Estimated: >60 mL/min (ref >60)
Glucose, Bld: 109 mg/dL — ABNORMAL HIGH (ref 70–99)
Potassium: 3.7 mmol/L (ref 3.5–5.1)
Sodium: 138 mmol/L (ref 135–145)
Total Bilirubin: 1 mg/dL (ref 0.3–1.2)
Total Protein: 6.9 g/dL (ref 6.5–8.1)

## 2022-11-16 LAB — TROPONIN I (HIGH SENSITIVITY)
Troponin I (High Sensitivity): 3023 ng/L (ref <18)
Troponin I (High Sensitivity): 3784 ng/L (ref <18)

## 2022-11-16 LAB — ECHOCARDIOGRAM COMPLETE
AR max vel: 3.2 cm2
AV Area VTI: 3.23 cm2
AV Area mean vel: 2.95 cm2
AV Mean grad: 4 mmHg
AV Peak grad: 8.5 mmHg
Ao pk vel: 1.46 m/s
Area-P 1/2: 3.23 cm2
Height: 71 in
MV VTI: 3.45 cm2
S' Lateral: 3.1 cm
Weight: 2608 oz

## 2022-11-16 LAB — HEPARIN LEVEL (UNFRACTIONATED): Heparin Unfractionated: 0.1 IU/mL — ABNORMAL LOW (ref 0.30–0.70)

## 2022-11-16 LAB — LIPID PANEL
Cholesterol: 85 mg/dL (ref 0–200)
HDL: 42 mg/dL (ref >40)
LDL Cholesterol: 30 mg/dL (ref 0–99)
Total CHOL/HDL Ratio: 2 RATIO
Triglycerides: 67 mg/dL (ref <150)
VLDL: 13 mg/dL (ref 0–40)

## 2022-11-16 LAB — PROTIME-INR
INR: 1.1 (ref 0.8–1.2)
Prothrombin Time: 13.9 seconds (ref 11.4–15.2)

## 2022-11-16 LAB — MAGNESIUM
Magnesium: 1.9 mg/dL (ref 1.7–2.4)
Magnesium: 2 mg/dL (ref 1.7–2.4)

## 2022-11-16 LAB — TSH: TSH: 3.249 u[IU]/mL (ref 0.350–4.500)

## 2022-11-16 LAB — CK: Total CK: 230 U/L (ref 49–397)

## 2022-11-16 LAB — PHOSPHORUS: Phosphorus: 4.1 mg/dL (ref 2.5–4.6)

## 2022-11-16 LAB — HIV ANTIBODY (ROUTINE TESTING W REFLEX): HIV Screen 4th Generation wRfx: NONREACTIVE

## 2022-11-16 SURGERY — LEFT HEART CATH AND CORONARY ANGIOGRAPHY
Anesthesia: LOCAL

## 2022-11-16 MED ORDER — HYDRALAZINE HCL 20 MG/ML IJ SOLN: 10.0000 mg | INTRAMUSCULAR | Status: DC | PRN

## 2022-11-16 MED ORDER — VERAPAMIL HCL 2.5 MG/ML IV SOLN
INTRAVENOUS | Status: AC
  Filled 2022-11-16: qty 2

## 2022-11-16 MED ORDER — MORPHINE SULFATE (PF) 2 MG/ML IV SOLN: 1.0000 mg | INTRAVENOUS | Status: DC | PRN

## 2022-11-16 MED ORDER — METOPROLOL SUCCINATE ER 25 MG PO TB24
25.0000 mg | ORAL_TABLET | Freq: Every day | ORAL | Status: DC
  Administered 2022-11-16: 25 mg via ORAL
  Filled 2022-11-16: qty 1

## 2022-11-16 MED ORDER — SODIUM CHLORIDE 0.9% FLUSH: 3.0000 mL | Freq: Two times a day (BID) | INTRAVENOUS | Status: DC

## 2022-11-16 MED ORDER — ALPRAZOLAM 0.25 MG PO TABS: 0.2500 mg | ORAL_TABLET | Freq: Two times a day (BID) | ORAL | Status: DC | PRN

## 2022-11-16 MED ORDER — SODIUM CHLORIDE 0.9 % WEIGHT BASED INFUSION
3.0000 mL/kg/h | INTRAVENOUS | Status: DC
  Administered 2022-11-16: 3 mL/kg/h via INTRAVENOUS

## 2022-11-16 MED ORDER — CLOPIDOGREL BISULFATE 75 MG PO TABS
75.0000 mg | ORAL_TABLET | Freq: Every day | ORAL | 3 refills | Status: DC
  Filled 2022-11-16: qty 30, 30d supply, fill #0

## 2022-11-16 MED ORDER — SODIUM CHLORIDE 0.9 % WEIGHT BASED INFUSION: 1.0000 mL/kg/h | INTRAVENOUS | Status: DC

## 2022-11-16 MED ORDER — ASPIRIN 81 MG PO TBEC
81.0000 mg | DELAYED_RELEASE_TABLET | Freq: Every day | ORAL | Status: DC
  Administered 2022-11-16: 81 mg via ORAL
  Filled 2022-11-16: qty 1

## 2022-11-16 MED ORDER — TICAGRELOR 90 MG PO TABS: 90.0000 mg | ORAL_TABLET | Freq: Every day | ORAL | Status: DC

## 2022-11-16 MED ORDER — RANOLAZINE ER 500 MG PO TB12
500.0000 mg | ORAL_TABLET | Freq: Two times a day (BID) | ORAL | Status: DC
  Administered 2022-11-16: 500 mg via ORAL
  Filled 2022-11-16: qty 1

## 2022-11-16 MED ORDER — SODIUM CHLORIDE 0.9% FLUSH: 3.0000 mL | INTRAVENOUS | Status: DC | PRN

## 2022-11-16 MED ORDER — RIVAROXABAN 10 MG PO TABS
10.0000 mg | ORAL_TABLET | Freq: Every day | ORAL | Status: DC
  Filled 2022-11-16: qty 1

## 2022-11-16 MED ORDER — NITROGLYCERIN 0.4 MG SL SUBL
0.4000 mg | SUBLINGUAL_TABLET | SUBLINGUAL | Status: DC | PRN
  Administered 2022-11-16: 0.4 mg via SUBLINGUAL
  Filled 2022-11-16: qty 1

## 2022-11-16 MED ORDER — CLOPIDOGREL BISULFATE 75 MG PO TABS
75.0000 mg | ORAL_TABLET | Freq: Every day | ORAL | Status: DC
  Administered 2022-11-16: 75 mg via ORAL
  Filled 2022-11-16: qty 1

## 2022-11-16 MED ORDER — POTASSIUM CHLORIDE 10 MEQ/100ML IV SOLN
10.0000 meq | INTRAVENOUS | Status: AC
  Administered 2022-11-16 (×4): 10 meq via INTRAVENOUS
  Filled 2022-11-16 (×2): qty 100

## 2022-11-16 MED ORDER — ACETAMINOPHEN 325 MG PO TABS: 650.0000 mg | ORAL_TABLET | ORAL | Status: DC | PRN

## 2022-11-16 MED ORDER — MIDAZOLAM HCL 2 MG/2ML IJ SOLN
INTRAMUSCULAR | Status: DC | PRN
  Administered 2022-11-16: 2 mg via INTRAVENOUS

## 2022-11-16 MED ORDER — SODIUM CHLORIDE 0.9 % IV SOLN: 250.0000 mL | INTRAVENOUS | Status: DC | PRN

## 2022-11-16 MED ORDER — ASPIRIN 81 MG PO TBEC: 81.0000 mg | DELAYED_RELEASE_TABLET | Freq: Every day | ORAL | Status: AC

## 2022-11-16 MED ORDER — ONDANSETRON HCL 4 MG/2ML IJ SOLN: 4.0000 mg | Freq: Four times a day (QID) | INTRAMUSCULAR | Status: DC | PRN

## 2022-11-16 MED ORDER — AMLODIPINE BESYLATE 10 MG PO TABS
10.0000 mg | ORAL_TABLET | Freq: Every day | ORAL | 0 refills | Status: AC
  Filled 2022-11-16: qty 90, 90d supply, fill #0

## 2022-11-16 MED ORDER — RIVAROXABAN 10 MG PO TABS
10.0000 mg | ORAL_TABLET | Freq: Every day | ORAL | 3 refills | Status: AC
  Filled 2022-11-16: qty 30, 30d supply, fill #0

## 2022-11-16 MED ORDER — SODIUM CHLORIDE 0.9% FLUSH
3.0000 mL | Freq: Two times a day (BID) | INTRAVENOUS | Status: DC
  Administered 2022-11-16: 3 mL via INTRAVENOUS

## 2022-11-16 MED ORDER — FENTANYL CITRATE (PF) 100 MCG/2ML IJ SOLN
INTRAMUSCULAR | Status: DC | PRN
  Administered 2022-11-16 (×2): 25 ug via INTRAVENOUS

## 2022-11-16 MED ORDER — LIDOCAINE HCL (PF) 1 % IJ SOLN
INTRAMUSCULAR | Status: AC
  Filled 2022-11-16: qty 30

## 2022-11-16 MED ORDER — FENTANYL CITRATE (PF) 100 MCG/2ML IJ SOLN
INTRAMUSCULAR | Status: AC
  Filled 2022-11-16: qty 2

## 2022-11-16 MED ORDER — LIDOCAINE HCL (PF) 1 % IJ SOLN
INTRAMUSCULAR | Status: DC | PRN
  Administered 2022-11-16: 2 mL

## 2022-11-16 MED ORDER — SODIUM CHLORIDE 0.9 % WEIGHT BASED INFUSION: 3.0000 mL/kg/h | INTRAVENOUS | Status: DC

## 2022-11-16 MED ORDER — IOHEXOL 350 MG/ML SOLN
INTRAVENOUS | Status: DC | PRN
  Administered 2022-11-16: 65 mL

## 2022-11-16 MED ORDER — PANTOPRAZOLE SODIUM 40 MG PO TBEC
40.0000 mg | DELAYED_RELEASE_TABLET | Freq: Every day | ORAL | Status: DC
  Administered 2022-11-16: 40 mg via ORAL
  Filled 2022-11-16: qty 1

## 2022-11-16 MED ORDER — SODIUM CHLORIDE 0.9 % WEIGHT BASED INFUSION
1.0000 mL/kg/h | INTRAVENOUS | Status: DC
  Administered 2022-11-16: 1 mL/kg/h via INTRAVENOUS

## 2022-11-16 MED ORDER — FENTANYL CITRATE PF 50 MCG/ML IJ SOSY: 50.0000 ug | PREFILLED_SYRINGE | Freq: Once | INTRAMUSCULAR | Status: DC

## 2022-11-16 MED ORDER — SODIUM CHLORIDE 0.9 % IV SOLN: INTRAVENOUS | Status: AC

## 2022-11-16 MED ORDER — HEPARIN SODIUM (PORCINE) 1000 UNIT/ML IJ SOLN
INTRAMUSCULAR | Status: DC | PRN
  Administered 2022-11-16: 8000 [IU] via INTRAVENOUS

## 2022-11-16 MED ORDER — NITROGLYCERIN 0.4 MG/SPRAY TL SOLN
1.0000 | Status: DC | PRN
  Filled 2022-11-16: qty 4.9

## 2022-11-16 MED ORDER — HEPARIN (PORCINE) IN NACL 1000-0.9 UT/500ML-% IV SOLN
INTRAVENOUS | Status: DC | PRN
  Administered 2022-11-16 (×2): 500 mL

## 2022-11-16 MED ORDER — ASPIRIN 81 MG PO CHEW
81.0000 mg | CHEWABLE_TABLET | ORAL | Status: AC
  Administered 2022-11-16: 81 mg via ORAL
  Filled 2022-11-16: qty 1

## 2022-11-16 MED ORDER — HEPARIN SODIUM (PORCINE) 1000 UNIT/ML IJ SOLN
INTRAMUSCULAR | Status: AC
  Filled 2022-11-16: qty 10

## 2022-11-16 MED ORDER — MIDAZOLAM HCL 2 MG/2ML IJ SOLN
INTRAMUSCULAR | Status: AC
  Filled 2022-11-16: qty 2

## 2022-11-16 MED ORDER — ATORVASTATIN CALCIUM 80 MG PO TABS
80.0000 mg | ORAL_TABLET | Freq: Every day | ORAL | Status: DC
  Administered 2022-11-16: 80 mg via ORAL
  Filled 2022-11-16: qty 1

## 2022-11-16 MED ORDER — VERAPAMIL HCL 2.5 MG/ML IV SOLN
INTRAVENOUS | Status: DC | PRN
  Administered 2022-11-16: 10 mL via INTRA_ARTERIAL

## 2022-11-16 MED ORDER — POTASSIUM CHLORIDE 10 MEQ/100ML IV SOLN
10.0000 meq | INTRAVENOUS | Status: DC
  Filled 2022-11-16 (×2): qty 100

## 2022-11-16 SURGICAL SUPPLY — 8 items
CATH OPTITORQUE TIG 4.0 5F (CATHETERS) IMPLANT
GLIDESHEATH SLEND A-KIT 6F 22G (SHEATH) IMPLANT
GUIDEWIRE INQWIRE 1.5J.035X260 (WIRE) IMPLANT
INQWIRE 1.5J .035X260CM (WIRE) ×1
KIT HEART LEFT (KITS) ×1 IMPLANT
PACK CARDIAC CATHETERIZATION (CUSTOM PROCEDURE TRAY) ×1 IMPLANT
TRANSDUCER W/STOPCOCK (MISCELLANEOUS) ×1 IMPLANT
TUBING CIL FLEX 10 FLL-RA (TUBING) ×1 IMPLANT

## 2022-11-16 NOTE — Interval H&P Note (Signed)
History and Physical Interval Note:  11/16/2022 7:49 AM  [name] [name]  has presented today for surgery, with the diagnosis of Non-ST elevation myocardial infarction.  The various methods of treatment have been discussed with the patient and family. After consideration of risks, benefits and other options for treatment, the patient has consented to  Procedure(s) with comments: LEFT HEART CATH AND CORONARY ANGIOGRAPHY (N/A) - FIrst case, 7:30 AM and possible coronary angioplasty as a surgical intervention.  The patient's history has been reviewed, patient examined, no change in status, stable for surgery.  I have reviewed the patient's chart and labs.  Questions were answered to the patient's satisfaction.     Adrian Prows

## 2022-11-16 NOTE — Assessment & Plan Note (Signed)
Spoke about importance of quitting

## 2022-11-16 NOTE — ED Notes (Addendum)
ED TO INPATIENT HANDOFF REPORT  ED Nurse Name and Phone #: Minor Iden/ [phone]  S Name/Age/Gender [name] [name] 72 y.o. male Room/Bed: 008C/008C  Code Status   Code Status: Full Code  Home/SNF/Other Home Patient oriented to: self, place, time, and situation Is this baseline? Yes   Triage Complete: Triage complete  Chief Complaint NSTEMI (non-ST elevated myocardial infarction) ([place]) [I21.4]  Triage Note PER EMS: pt is from home with c/o generalized chest pain described as pressure that radiates down his left arm associated with SOB and an episode of diaphoresis with EMS, onset tonight at 1830. He took 4 baby aspirin at home. EMS adm 1 SL nitro which resolved his pain. Pt reports he has a hx of cardiac stents.  BP- 158/96, HR- 86, O2-100%, cbg-86   Allergies Allergies  Allergen Reactions   Shellfish-Derived Products Anaphylaxis, Shortness Of Breath and Other (See Comments)    "My throat goes numb"    Level of Care/Admitting Diagnosis ED Disposition     ED Disposition  Admit   Condition  --   [place]: [place] [[id number]]  Level of Care: Progressive [102]  Admit to Progressive based on following criteria: CARDIOVASCULAR & THORACIC of moderate stability with acute coronary syndrome symptoms/low risk myocardial infarction/hypertensive urgency/arrhythmias/heart failure potentially compromising stability and stable post cardiovascular intervention patients.  May admit patient to [facility] or [facility] if equivalent level of care is available:: No  Covid Evaluation: Asymptomatic - no recent exposure (last 10 days) testing not required  Diagnosis: NSTEMI (non-ST elevated myocardial infarction) [id number]  Admitting Physician: Toy Baker [[id number]]  Attending Physician: Toy Baker [id number]  Certification:: I certify this patient will need inpatient services for at least 2 midnights  Estimated Length of Stay: 2           B Medical/Surgery History Past Medical History:  Diagnosis Date   Arthritis    Cataracts, bilateral    Hyperlipidemia    Hypertension    Seasonal allergies    Sickle cell trait ([place])    Past Surgical History:  Procedure Laterality Date   APPENDECTOMY     KNEE ARTHROSCOPY WITH ANTERIOR CRUCIATE LIGAMENT (ACL) REPAIR Left    LEFT HEART CATH AND CORONARY ANGIOGRAPHY N/A 05/11/2020   Procedure: LEFT HEART CATH AND CORONARY ANGIOGRAPHY;  Surgeon: Charolette Forward, MD;  Location: [place] [facility];  Service: Cardiovascular;  Laterality: N/A;   STENT PLACEMENT VASCULAR ([place] HX)       A IV Location/Drains/Wounds Patient Lines/Drains/Airways Status     Active Line/Drains/Airways     Name Placement date Placement time Site Days   Peripheral IV 11/15/22 20 G Anterior;Right Forearm 11/15/22  2007  Forearm  1   Wound / Incision (Open or Dehisced) 02/14/17 Laceration Finger (Comment which one) Left LACERATION UNDER 2ND FINGER AND LACERATION TO 3RD FINGER KNUCKLE  02/14/17  2220  Finger (Comment which one)  2101            Intake/Output Last 24 hours  Intake/Output Summary (Last 24 hours) at 11/16/2022 0057 Last data filed at 11/15/2022 2214 Gross per 24 hour  Intake --  Output 200 ml  Net -200 ml    Labs/Imaging Results for orders placed or performed during the hospital encounter of 11/15/22 (from the past 48 hour(s))  Basic metabolic panel     Status: Abnormal   Collection Time: 11/15/22  8:29 PM  Result Value Ref Range   Sodium 134 (L) 135 -  145 mmol/L   Potassium 3.1 (L) 3.5 - 5.1 mmol/L   Chloride 102 98 - 111 mmol/L   CO2 22 22 - 32 mmol/L   Glucose, Bld 101 (H) 70 - 99 mg/dL    Comment: Glucose reference range applies only to samples taken after fasting for at least 8 hours.   BUN 5 (L) 8 - 23 mg/dL   Creatinine, Ser 0.80 0.61 - 1.24 mg/dL   Calcium 9.0 8.9 - 10.3 mg/dL   GFR, Estimated >60 >60 mL/min    Comment: (NOTE) Calculated using the CKD-EPI Creatinine  Equation (2021)    Anion gap 10 5 - 15    Comment: Performed at [facility] [address], [place], [place] [address]  CBC     Status: Abnormal   Collection Time: 11/15/22  8:29 PM  Result Value Ref Range   WBC 8.9 4.0 - 10.5 K/uL   RBC 4.10 (L) 4.22 - 5.81 MIL/uL   Hemoglobin 12.1 (L) 13.0 - 17.0 g/dL   HCT 36.5 (L) 39.0 - 52.0 %   MCV 89.0 80.0 - 100.0 fL   MCH 29.5 26.0 - 34.0 pg   MCHC 33.2 30.0 - 36.0 g/dL   RDW 13.1 11.5 - 15.5 %   Platelets 168 150 - 400 K/uL   nRBC 0.0 0.0 - 0.2 %    Comment: Performed at [facility], [place] [address], [place], [place] [address]  Troponin I (High Sensitivity)     Status: Abnormal   Collection Time: 11/15/22  8:29 PM  Result Value Ref Range   Troponin I (High Sensitivity) 142 (HH) <18 ng/L    Comment: CRITICAL RESULT CALLED TO, READ BACK BY AND VERIFIED WITH Thurmond Butts TERRY,RN 2123 11/15/2022 WBOND (NOTE) Elevated high sensitivity troponin I (hsTnI) values and significant  changes across serial measurements may suggest ACS but many other  chronic and acute conditions are known to elevate hsTnI results.  Refer to the "Links" section for chest pain algorithms and additional  guidance. Performed at [facility], [place] [address], [place], [place] [address]   Troponin I (High Sensitivity)     Status: Abnormal   Collection Time: 11/15/22 10:07 PM  Result Value Ref Range   Troponin I (High Sensitivity) 1,026 (HH) <18 ng/L    Comment: CRITICAL VALUE NOTED. VALUE IS CONSISTENT WITH PREVIOUSLY REPORTED/CALLED VALUE (NOTE) Elevated high sensitivity troponin I (hsTnI) values and significant  changes across serial measurements may suggest ACS but many other  chronic and acute conditions are known to elevate hsTnI results.  Refer to the "Links" section for chest pain algorithms and additional  guidance. Performed at [facility], [place] [address], [place], [place] [address]    DG Chest 2 View  Result Date:  11/15/2022 CLINICAL DATA:  Chest pain. EXAM: CHEST - 2 VIEW COMPARISON:  March 25, 2021 FINDINGS: The heart size and mediastinal contours are within normal limits. A coronary artery stent is seen. Both lungs are clear. The visualized skeletal structures are unremarkable. IMPRESSION: No active cardiopulmonary disease. Electronically Signed   By: Virgina Norfolk M.D.   On: 11/15/2022 21:24    Pending Labs Unresulted Labs (From admission, onward)     Start     Ordered   11/16/22 0500  Heparin level (unfractionated)  Daily,   R     See Hyperspace for full Linked Orders Report.   11/15/22 2336   11/16/22 0500  CBC  Daily,   R  See Hyperspace for full Linked Orders Report.   11/15/22 2336   11/16/22 0049  CK  Add-on,   AD        11/16/22 0048   11/16/22 0049  Hepatic function panel  Add-on,   AD       Question:  Release to patient  Answer:  Immediate   11/16/22 0048   11/16/22 0049  Magnesium  Add-on,   AD        11/16/22 0048   11/16/22 0049  Phosphorus  Add-on,   AD        11/16/22 0048   11/16/22 0049  TSH  Add-on,   AD        11/16/22 0048   11/16/22 0047  Rapid urine drug screen (hospital performed)  ONCE - STAT,   STAT        11/16/22 0046   11/16/22 0045  D-dimer, quantitative  Once,   R        11/16/22 0044   Signed and Held  HIV Antibody (routine testing w rflx)  (HIV Antibody (Routine testing w reflex) panel)  Once,   R        Signed and Held   Signed and Held  Lipoprotein A (LPA)  Tomorrow morning,   R        Signed and Held   Signed and Held  Comprehensive metabolic panel  Tomorrow morning,   R        Signed and Held   Signed and Held  Magnesium  Tomorrow morning,   R        Signed and Held   Signed and Held  Hemoglobin A1c  Tomorrow morning,   R        Signed and Held   Signed and Held  CBC with Differential/Platelet  Tomorrow morning,   R        Signed and Held   Signed and Held  Protime-INR  Tomorrow morning,   R        Signed and Held   Signed and Held  Lipid  panel  Tomorrow morning,   R        Signed and Held            Vitals/Pain Today's Vitals   11/15/22 2230 11/15/22 2245 11/15/22 2300 11/15/22 2315  BP: (!) 151/94 (!) 142/87 (!) 155/90 (!) 159/94  Pulse: 65 68 68 72  Resp: 20 (!) 21 19 (!) 23  Temp:      TempSrc:      SpO2: 97% 95% 95% 95%  Weight:      Height:      PainSc:        Isolation Precautions No active isolations  Medications Medications  tirofiban (AGGRASTAT) infusion 50 mcg/mL 100 mL (0.15 mcg/kg/min  73.9 kg Intravenous New Bag/Given 11/16/22 0053)  sodium chloride flush (NS) 0.9 % injection 3 mL (3 mLs Intravenous Not Given 11/15/22 2334)  heparin ADULT infusion 100 units/mL (25000 units/238m) (900 Units/hr Intravenous New Bag/Given 11/16/22 0052)  potassium chloride 10 mEq in 100 mL IVPB (has no administration in time range)  morphine (PF) 2 MG/ML injection 1-2 mg (has no administration in time range)  tirofiban (AGGRASTAT) bolus via infusion 1,847.5 mcg (1,847.5 mcg Intravenous Bolus from Bag 11/16/22 0053)    Mobility walks     Focused Assessments     R Recommendations: See Admitting Provider Note  Report given to:   Additional Notes: Pt came in with CP  and took Nitro spray and aspirin at home but had no relief. His first trop was 142 and 2nd trop was 1,026. I just sent his third trop down. His potassium is 3.1. He has tirofiban going at 13.3 ml/hr and heparin going at 45m/hr. He's A&Ox 4 and ambulatory but uses a urinal at bedside.

## 2022-11-16 NOTE — TOC Progression Note (Addendum)
Transition of Care [facility]) - Progression Note    Patient Details  Name: [name] [name] MRN: [id number] Date of Birth: 06-17-1951  [facility]) CM/SW Contact  Zenon Mayo, RN Phone Number: 11/16/2022, 10:43 AM  Clinical Narrative:    From home alone, presents with NSTEMI, s/p cath has radial hematoma.  Patient states his son will transport him home when he is discharged. Having chest pain today.         Expected Discharge Plan and Services                                               Social Determinants of Health (SDOH) Interventions SDOH Screenings   Tobacco Use: Medium Risk (11/16/2022)    Readmission Risk Interventions     No data to display

## 2022-11-16 NOTE — Assessment & Plan Note (Signed)
Will replace 

## 2022-11-16 NOTE — Assessment & Plan Note (Signed)
Continue Lipitor at 80 mg p.o. daily

## 2022-11-16 NOTE — Plan of Care (Signed)

## 2022-11-16 NOTE — Progress Notes (Signed)
Explained discharge instructions to patient. Reviewed follow up appointment and next medication administration times. Also reviewed education. Patient verbalized having an understanding for instructions given. All belongings are in the patient's possession to include TOC meds. IV and telemetry were removed. CCMD was notified. No other needs verbalized. Transported downstairs to discharge lounge to await his ride home.

## 2022-11-16 NOTE — Progress Notes (Signed)
Pt arrived from cath lab stable. Around 915 RN assessed swelling above radial site. Cath lab RN arrived to assess and held pressure. Pt TR band now @ 1cc, stable. Have to educate pt on keeping arm in one position. Will cont to monitor

## 2022-11-16 NOTE — Hospital Course (Addendum)
Mr. Paone is a 72 yo male with PMH CAD, HTN, HLD, [place] trait, arthritis who presented with chest pain. He was admitted for further workup.  Of note, UDS was positive for cocaine.  When asked about this, he states he had intended to smoke marijuana and was unaware that there was cocaine present. Troponin was 142 on admission which up trended to over 3000.  He was started on a heparin drip and Aggrastat; cardiology was consulted. He underwent catheterization on 11/16/2022 which showed patent vessels with no significant stenosis requiring stenting however there was mild neointimal hyperplasia concerning for presence of thrombus formation.  He was continued on aspirin for 30 days with addition of Plavix for 1 year or indefinitely per cardiology and also started on Xarelto 10 mg daily to prevent thrombus formation. Amlodipine was also increased and may need readjustment at follow-up if cocaine was contributing to hypertension on admission. Patient improved faster than anticipated after procedures and was considered stable for discharging home.

## 2022-11-16 NOTE — H&P (View-Only) (Signed)
CARDIOLOGY CONSULT NOTE  Patient ID: [name] [name] MRN: [id number] DOB/AGE: October 15, 1950 72 y.o.  Admit date: 11/15/2022 Referring Physician  Dwyane Dee Primary Physician:  Audley Hose, MD Reason for Consultation  NSTEMI  Patient ID: [name] [name], male    DOB: April 19, 1951, 72 y.o.   MRN: [id number]  Chief Complaint  Patient presents with   Chest Pain   HPI:    [name] [name]  is a 72 y.o. coronary artery disease status post PCI to LAD in the past, hypertension, hyperlipidemia, tobacco abuse, osteoarthritis, sickle cell trait, last cardiac catheterization and angioplasty to the LAD in 2021 and had thrombotic restenosis in the proximal LAD leading to repeat stenting and also stenting to the distal LAD, patient has been on chronic Brilinta.  I was requested by Dr. Terrence Dupont evaluate the patient for possible angioplasty as well.  Over the past few days he has been having occasional episodes of chest pain, however yesterday started having chest discomfort similar to his angina and he took sublingual nitroglycerin without much relief and also had diaphoresis associated with with radiation of the chest pain to the left arm, hence presented to the emergency room.  Upon presentation, chest pain symptoms have improved, his cardiac markers were positive for non-STEMI.  Patient admitted overnight with heparin also Aggrastat.  Patient states that this morning chest pain is better, had mild chest discomfort last night as well but states that since being in the hospital overall he feels better.  No other associated symptoms presently.  States that he has been compliant with all his medications.  Past Medical History:  Diagnosis Date   Arthritis    Cataracts, bilateral    Hyperlipidemia    Hypertension    Seasonal allergies    Sickle cell trait ([place])    Past Surgical History:  Procedure Laterality Date   APPENDECTOMY     KNEE ARTHROSCOPY WITH ANTERIOR CRUCIATE LIGAMENT (ACL) REPAIR  Left    LEFT HEART CATH AND CORONARY ANGIOGRAPHY N/A 05/11/2020   Procedure: LEFT HEART CATH AND CORONARY ANGIOGRAPHY;  Surgeon: Charolette Forward, MD;  Location: [place] [facility];  Service: Cardiovascular;  Laterality: N/A;   STENT PLACEMENT VASCULAR ([place] HX)     Social History   Tobacco Use   Smoking status: Former   Smokeless tobacco: Former    Types: Chew    Quit date: 09/18/1970  Substance Use Topics   Alcohol use: Yes    Comment: occas    Family History  Problem Relation Age of Onset   Colon cancer Neg Hx    Esophageal cancer Neg Hx    Rectal cancer Neg Hx    Stomach cancer Neg Hx     Marital Status: Legally Separated  ROS  Review of Systems  Cardiovascular:  Positive for chest pain. Negative for dyspnea on exertion and leg swelling.   Objective      11/16/2022    6:09 AM 11/16/2022    1:33 AM 11/15/2022   11:15 PM  Vitals with BMI  Systolic [id number]  Diastolic 97 81 94  Pulse 72 80 72    Blood pressure (!) 169/97, pulse 72, temperature 98.2 F (36.8 C), temperature source Oral, resp. rate 20, height '5\' 11"'$  (1.803 m), weight 73.9 kg, SpO2 99 %.   Physical Exam Neck:     Vascular: Carotid bruit (bilateral carotid bruit, L>R) present. No JVD.  Cardiovascular:     Rate and Rhythm: Normal rate and regular rhythm.  Pulses: Intact distal pulses.     Heart sounds: Normal heart sounds. No murmur heard.    No gallop.  Pulmonary:     Effort: Pulmonary effort is normal.     Breath sounds: Normal breath sounds.  Abdominal:     General: Bowel sounds are normal.     Palpations: Abdomen is soft.  Musculoskeletal:     Right lower leg: No edema.     Left lower leg: No edema.    Laboratory examination:   Recent Labs    11/15/22 2029 11/16/22 0240  NA 134* 138  K 3.1* 3.7  CL 102 104  CO2 22 25  GLUCOSE 101* 109*  BUN 5* 8  CREATININE 0.80 0.84  CALCIUM 9.0 8.9  GFRNONAA >60 >60      Latest Ref Rng & Units 11/16/2022    2:40 AM 11/16/2022   12:46 AM  11/15/2022    8:29 PM  CMP  Glucose 70 - 99 mg/dL 109   101   BUN 8 - 23 mg/dL 8   5   Creatinine 0.61 - 1.24 mg/dL 0.84   0.80   Sodium 135 - 145 mmol/L 138   134   Potassium 3.5 - 5.1 mmol/L 3.7   3.1   Chloride 98 - 111 mmol/L 104   102   CO2 22 - 32 mmol/L 25   22   Calcium 8.9 - 10.3 mg/dL 8.9   9.0   Total Protein 6.5 - 8.1 g/dL 6.9  7.4    Total Bilirubin 0.3 - 1.2 mg/dL 1.0  0.8    Alkaline Phos 38 - 126 U/L 77  80    AST 15 - 41 U/L 39  38    ALT 0 - 44 U/L 20  21        Latest Ref Rng & Units 11/16/2022    2:40 AM 11/15/2022    8:29 PM 03/27/2021    5:31 AM  CBC  WBC 4.0 - 10.5 K/uL 8.2  8.9  6.6   Hemoglobin 13.0 - 17.0 g/dL 11.8  12.1  10.4   Hematocrit 39.0 - 52.0 % 35.5  36.5  31.7   Platelets 150 - 400 K/uL 169  168  174    Lipid Panel Recent Labs    11/16/22 0240  CHOL 85  TRIG 67  LDLCALC 30  VLDL 13  HDL 42  CHOLHDL 2.0    HEMOGLOBIN A1C No results found for: "HGBA1C", "MPG" TSH Recent Labs    11/16/22 0046  TSH 3.249   BNP (last 3 results) No results for input(s): "BNP" in the last 8760 hours. Cardiac Panel (last 3 results) Recent Labs    11/15/22 2207 11/16/22 0046 11/16/22 0240  CKTOTAL  --  230  --   TROPONINIHS 1,026* 3,023* 3,784*     Medications and allergies   Allergies  Allergen Reactions   Shellfish-Derived Products Anaphylaxis, Shortness Of Breath and Other (See Comments)    "My throat goes numb"     No outpatient medications have been marked as taking for the 11/15/22 encounter [facility] Encounter).    Scheduled Meds:  aspirin  81 mg Oral Pre-Cath   aspirin EC  81 mg Oral Daily   atorvastatin  80 mg Oral Daily   metoprolol succinate  25 mg Oral Daily   pantoprazole  40 mg Oral Q0600   ranolazine  500 mg Oral BID   sodium chloride flush  3 mL Intravenous Q12H   sodium  chloride flush  3 mL Intravenous Q12H   Continuous Infusions:  sodium chloride     sodium chloride 1 mL/kg/hr (11/16/22 0521)   heparin 900  Units/hr (11/16/22 0052)   potassium chloride 10 mEq (11/16/22 0521)   tirofiban 0.15 mcg/kg/min (11/16/22 0520)   PRN Meds:.sodium chloride, acetaminophen, ALPRAZolam, morphine injection, nitroGLYCERIN, ondansetron (ZOFRAN) IV, sodium chloride flush   No intake/output data recorded. Total I/O In: -  Out: 900 [Urine:900]  Net IO Since Admission: -900 mL [11/16/22 0618]   Radiology:   DG Chest 2 View  Result Date: 11/15/2022 CLINICAL DATA:  Chest pain. EXAM: CHEST - 2 VIEW COMPARISON:  March 25, 2021 FINDINGS: The heart size and mediastinal contours are within normal limits. A coronary artery stent is seen. Both lungs are clear. The visualized skeletal structures are unremarkable. IMPRESSION: No active cardiopulmonary disease. Electronically Signed   By: Virgina Norfolk M.D.   On: 11/15/2022 21:24    Cardiac Studies:   Echocardiogram 03/30/2019:   1. The left ventricle has normal systolic function, with an ejection fraction of 55-60%. The cavity size was normal. No evidence of left ventricular regional wall motion abnormalities.  2. The right ventricle has normal systolc function. There is no increase in right ventricular wall thickness. Right ventricular systolic pressure is normal.  3. The aortic valve is tricuspid Mild thickening of the aortic valve. No stenosis of the aortic valve.   FINDINGS  Left Ventricle: The left ventricle has normal systolic function, with an ejection fraction of 55-60%. The cavity size was normal. There is no increase in left ventricular wall thickness. No evidence of left ventricular regional wall motion  abnormalities.  Left Heart Catheterization 05/11/2020:  Left Anterior Descending  Prox LAD to Mid LAD lesion is 20% stenosed. The lesion was previously treated using a drug eluting stent between 1-5 months ago.    Ramus Intermedius  Ramus lesion is 20% stenosed.    Left Circumflex  Ost Cx to Prox Cx lesion is 20% stenosed.    First Obtuse Marginal  Branch  Vessel is small in size.    Right Coronary Artery  Prox RCA to Mid RCA lesion is 40% stenosed.        Left Heart Catheterization 03/17/2020: 3.0 mm, X 15 mm Resolute Onyx DES was placed in the Proximal left anterior descending, inside previously placed stents   2.5 mm, X 8 mm, & 15 mm Resolute Onyx DES were placed in the Distal left  anterior descending, distal to previously placed stents.  A second stent had to be deployed due to geographic miss from balloon migration   Regadenoson Nuclear stress test 03/26/2021: 1. No reversible ischemia or infarction. 2. Mild global hypokinesis 3. Left ventricular ejection fraction 41% 4. Non invasive risk stratification*: Intermediate-based on ejection fraction.  EKG:  EKG 11/15/2022: Normal sinus rhythm at rate of 71 bpm, normal axis, early repolarization.  Compared to 03/27/2021, no change.  Assessment   1.  NSTEMI 2.  Coronary artery disease native vessel with unstable angina, history of LAD proximal stenting for in-stent thrombotic lesion in June 2021, LAD distal lesion stented as well. 3.  Hypercholesteremia 4.  Primary hypertension 5.  Tobacco use disorder, patient smokes cigars. 6.  Bilateral Carotid bruit left greater than the right, will need follow-up Dopplers.  Recommendations:   Patient is compliant with all his medications, presented with NSTEMI.  Concerned about either recurrent restenosis in the LAD or new lesions, I reviewed his external cardiac catheterization report from  [facility].  Will schedule him for cardiac catheterization, patient is willing to proceed. Schedule for cardiac catheterization, and possible angioplasty. We discussed regarding risks, benefits, alternatives to this including stress testing, CTA and continued medical therapy. Patient wants to proceed. Understands <1-2% risk of death, stroke, MI, urgent CABG, bleeding, infection, renal failure but not limited to these.    Patient has been compliant with all his medications, presently on dual antiplatelet therapy with aspirin and Brilinta.  Nuclear topical ischemia, he is on high intensity statins with LDL goal achieved.  Blood pressure is elevated, I will address this after cardiac catheterization.  I discussed with the patient regarding smoking cessation as well.   Adrian Prows, MD, [facility] 11/16/2022, 6:18 AM Office: [phone]

## 2022-11-16 NOTE — Progress Notes (Signed)
*  PRELIMINARY RESULTS* Echocardiogram 2D Echocardiogram has been performed.  Elpidio Anis 11/16/2022, 2:10 PM

## 2022-11-16 NOTE — Assessment & Plan Note (Addendum)
Cardiology aware Dr. Einar Gip is following closely.  Recommend heparin Aggrastat drip and beta-blocker np.o. for midnight For cardiac catheterization in the morning.  If patient develops severe chest pain again please call cardiology immediately Currently hemodynamically stable Reports chest pain is mostly gone

## 2022-11-16 NOTE — TOC Transition Note (Addendum)
Transition of Care [facility]) - CM/SW Discharge Note   Patient Details  Name: EVERETTE VIRGINIA MRN: [id number] Date of Birth: 16-Aug-1951  [facility]) CM/SW Contact:  Zenon Mayo, RN Phone Number: 11/16/2022, 11:21 AM   Clinical Narrative:    Patient is for dc today, he states his son will transport him home. He will be on xarelto, the copay for xarelto for the refill will be 45.00.  TOC to fill meds and they will fill there first 30 days free for xarelto also.          Patient Goals and CMS Choice      Discharge Placement                         Discharge Plan and Services Additional resources added to the After Visit Summary for                                       Social Determinants of Health (SDOH) Interventions SDOH Screenings   Tobacco Use: Medium Risk (11/16/2022)     Readmission Risk Interventions     No data to display

## 2022-11-16 NOTE — Discharge Instructions (Signed)
Information on my medicine - XARELTO (Rivaroxaban)  This medication education was reviewed with me or my healthcare representative as part of my discharge preparation.   Why was Xarelto prescribed for you? Xarelto was prescribed for you to reduce the risk of blood clots forming in your heart. The medical term for this type of abnormal blood clot is thrombus.  What do you need to know about xarelto ? Take your Xarelto ONCE DAILY at the same time every day. You may take it either with or without food.  If you have difficulty swallowing the tablet whole, you may crush it and mix in applesauce just prior to taking your dose.  Take Xarelto exactly as prescribed by your doctor and DO NOT stop taking Xarelto without talking to the doctor who prescribed the medication.  Stopping without other VTE prevention medication to take the place of Xarelto may increase your risk of developing a clot.  After discharge, you should have regular check-up appointments with your healthcare provider that is prescribing your Xarelto.    What do you do if you miss a dose? If you miss a dose, take it as soon as you remember on the same day then continue your regularly scheduled once daily regimen the next day. Do not take two doses of Xarelto on the same day.   Important Safety Information A possible side effect of Xarelto is bleeding. You should call your healthcare provider right away if you experience any of the following: Bleeding from an injury or your nose that does not stop. Unusual colored urine (red or dark brown) or unusual colored stools (red or black). Unusual bruising for unknown reasons. A serious fall or if you hit your head (even if there is no bleeding).  Some medicines may interact with Xarelto and might increase your risk of bleeding while on Xarelto. To help avoid this, consult your healthcare provider or pharmacist prior to using any new prescription or non-prescription medications,  including herbals, vitamins, non-steroidal anti-inflammatory drugs (NSAIDs) and supplements.  This website has more information on Xarelto: [email or web address]

## 2022-11-16 NOTE — H&P (Signed)
[name] [name] [id number] DOB: 1951-04-18 DOA: 11/15/2022     PCP: Audley Hose, MD   Outpatient Specialists:   CARDS:   Dr. Terrence Dupont    Patient arrived to ER on 11/15/22 at 1958 Referred by Attending Toy Baker, MD   Patient coming from:    home Lives alone,     Chief Complaint:   Chief Complaint  Patient presents with   Chest Pain    HPI: [name] [name] is a 72 y.o. male with medical history significant of CAD sp stents ,HTN, HLD, tobacco abuse, history of alcohol abuse sickle cell trait    Presented with chest pain Patient presents with chest pain radiates down her left arm associated shortness of breath and diaphoresis started at 41 tonight she took 4 baby aspirin's and sublingual nitro which has resolved her pain she has known history of CAD with cardiac stents   Still smokes cigars States he has a very mild discomfort now feels like a pebble Reports he drank etoh yesterday he had 1 beer but had not had any all January   The intensity of CP has greatly improved    Regarding pertinent Chronic problems:     Hyperlipidemia -  on statins Lipitor (atorvastatin)  Lipid Panel     Component Value Date/Time   CHOL 80 03/26/2021 0024   TRIG 16 03/26/2021 0024   HDL 53 03/26/2021 0024   CHOLHDL 1.5 03/26/2021 0024   VLDL 3 03/26/2021 0024   LDLCALC 24 03/26/2021 0024     HTN on amlodipine metoprolol      CAD  - On Aspirin, statin, betablocker, Brilinta Ranexa                -  followed by cardiology                - last cardiac cath  August 2021 Ost Cx to Prox Cx lesion is 20% stenosed. Prox LAD to Mid LAD lesion is 20% stenosed. Prox RCA to Mid RCA lesion is 40% stenosed. Ramus lesion is 20% stenosed. The left ventricular systolic function is normal. LV end diastolic pressure is mildly elevated. The left ventricular ejection fraction is 50-55% by visual estimate.     Chronic anemia - baseline hg Hemoglobin & Hematocrit  Recent Labs     11/15/22 2029  HGB 12.1*     While in ER:   Noted elevated troponin cardiology was consulted recommend patient to be started on Aggrastat and medicine to admit    CXR -  NON acute    Following Medications were ordered in ER: Medications  tirofiban (AGGRASTAT) bolus via infusion 1,847.5 mcg (has no administration in time range)  tirofiban (AGGRASTAT) infusion 50 mcg/mL 100 mL (has no administration in time range)  sodium chloride flush (NS) 0.9 % injection 3 mL (3 mLs Intravenous Not Given 11/15/22 2334)  heparin ADULT infusion 100 units/mL (25000 units/263m) (has no administration in time range)    _______________________________________________________ ER Provider Called:  Cardiology   Dr. GEinar GipThey Recommend admit to medicine   Will see in AM     ED Triage Vitals  Enc Vitals Group     BP 11/15/22 2006 119/79     Pulse Rate 11/15/22 2006 95     Resp 11/15/22 2006 17     Temp 11/15/22 2006 98.4 F (36.9 C)     Temp Source 11/15/22 2006 Oral     SpO2 11/15/22 2006 97 %  Weight 11/15/22 2001 163 lb (73.9 kg)     Height 11/15/22 2001 '5\' 11"'$  (1.803 m)     Head Circumference --      Peak Flow --      Pain Score 11/15/22 2001 0     Pain Loc --      Pain Edu? --      Excl. in [place]? --   TMAX(24)@     _________________________________________ Significant initial  Findings: Abnormal Labs Reviewed  BASIC METABOLIC PANEL - Abnormal; Notable for the following components:      Result Value   Sodium 134 (*)    Potassium 3.1 (*)    Glucose, Bld 101 (*)    BUN 5 (*)    All other components within normal limits  CBC - Abnormal; Notable for the following components:   RBC 4.10 (*)    Hemoglobin 12.1 (*)    HCT 36.5 (*)    All other components within normal limits  TROPONIN I (HIGH SENSITIVITY) - Abnormal; Notable for the following components:   Troponin I (High Sensitivity) 142 (*)    All other components within normal limits  TROPONIN I (HIGH SENSITIVITY) - Abnormal;  Notable for the following components:   Troponin I (High Sensitivity) 1,026 (*)    All other components within normal limits     _________________________ Troponin 142 - 1026 ECG: Ordered Personally reviewed and interpreted by me showing: HR : 71 Rhythm: Sinus rhythm Probable left atrial enlargement ST elevation, consider anterior injury no sig change from previous QTC 421   ____________________ This patient meets SIRS Criteria and may be septic.     The recent clinical data is shown below. Vitals:   11/15/22 2230 11/15/22 2245 11/15/22 2300 11/15/22 2315  BP: (!) 151/94 (!) 142/87 (!) 155/90 (!) 159/94  Pulse: 65 68 68 72  Resp: 20 (!) 21 19 (!) 23  Temp:      TempSrc:      SpO2: 97% 95% 95% 95%  Weight:      Height:           WBC     Component Value Date/Time   WBC 8.9 11/15/2022 2029   LYMPHSABS 0.9 03/25/2021 1049   MONOABS 0.5 03/25/2021 1049   EOSABS 0.2 03/25/2021 1049   BASOSABS 0.0 03/25/2021 1049       _______________________________________________ Hospitalist was called for admission for   NSTEMI (non-ST elevated myocardial infarction) ([place])     The following Work up has been ordered so far:  Orders Placed This Encounter  Procedures   Procedural/ Surgical Case Request: CORONARY ANGIOGRAPHY   DG Chest 2 View   Basic metabolic panel   CBC   Heparin level (unfractionated)   CBC   Document Height and Actual Weight   Cardiac monitoring   Saline Lock IV, Maintain IV access   Cardiac Monitoring - Continuous Indefinite   Inpatient consult to Cardiology   heparin per pharmacy consult   Consult for [facility] Admission   Pulse oximetry, continuous   EKG 12-Lead   ED EKG within 10 minutes   EKG 12-Lead   Admit to Inpatient (patient's expected length of stay will be greater than 2 midnights or inpatient only procedure)     OTHER Significant initial  Findings:  labs showing:    Recent Labs  Lab 11/15/22 2029  NA 134*  K 3.1*   CO2 22  GLUCOSE 101*  BUN 5*  CREATININE 0.80  CALCIUM 9.0  Cr stable,    Lab Results  Component Value Date   CREATININE 0.80 11/15/2022   CREATININE 0.97 03/26/2021   CREATININE 0.85 03/25/2021     No results for input(s): "AST", "ALT", "ALKPHOS", "BILITOT", "PROT", "ALBUMIN" in the last 168 hours. Lab Results  Component Value Date   CALCIUM 9.0 11/15/2022        Plt: Lab Results  Component Value Date   PLT 168 11/15/2022     COVID-19 Labs  No results for input(s): "DDIMER", "FERRITIN", "LDH", "CRP" in the last 72 hours.  Lab Results  Component Value Date   SARSCOV2NAA NEGATIVE 03/25/2021   [place] NEGATIVE 05/09/2020   [place] Not Detected 08/22/2019   [place] NEGATIVE 07/29/2019      Recent Labs  Lab 11/15/22 2029  WBC 8.9  HGB 12.1*  HCT 36.5*  MCV 89.0  PLT 168    HG/HCT  stable,       Component Value Date/Time   HGB 12.1 (L) 11/15/2022 2029   HCT 36.5 (L) 11/15/2022 2029   MCV 89.0 11/15/2022 2029    Cardiac Panel (last 3 results) No results for input(s): "CKTOTAL", "CKMB", "TROPONINI", "RELINDX" in the last 72 hours.    Cultures:    Component Value Date/Time   SDES URINE, RANDOM 03/28/2019 1206   SPECREQUEST NONE 03/28/2019 1206   CULT  03/28/2019 1206    NO GROWTH Performed at [facility], [place] [address], [place], [place] [address]    REPTSTATUS 03/29/2019 FINAL 03/28/2019 1206     Radiological Exams on Admission: DG Chest 2 View  Result Date: 11/15/2022 CLINICAL DATA:  Chest pain. EXAM: CHEST - 2 VIEW COMPARISON:  March 25, 2021 FINDINGS: The heart size and mediastinal contours are within normal limits. A coronary artery stent is seen. Both lungs are clear. The visualized skeletal structures are unremarkable. IMPRESSION: No active cardiopulmonary disease. Electronically Signed   By: Virgina Norfolk M.D.   On: 11/15/2022 21:24    _______________________________________________________________________________________________________ Latest  Blood pressure (!) 159/94, pulse 72, temperature 98.4 F (36.9 C), temperature source Oral, resp. rate (!) 23, height '5\' 11"'$  (1.803 m), weight 73.9 kg, SpO2 95 %.   Vitals  labs and radiology finding personally reviewed  Review of Systems:    Pertinent positives include:   shortness of breath at rest.   chest pain, Constitutional:  No weight loss, night sweats, Fevers, chills, fatigue, weight loss  HEENT:  No headaches, Difficulty swallowing,Tooth/dental problems,Sore throat,  No sneezing, itching, ear ache, nasal congestion, post nasal drip,  Cardio-vascular:  No  Orthopnea, PND, anasarca, dizziness, palpitations.no Bilateral lower extremity swelling  GI:  No heartburn, indigestion, abdominal pain, nausea, vomiting, diarrhea, change in bowel habits, loss of appetite, melena, blood in stool, hematemesis Resp:  noNo dyspnea on exertion, No excess mucus, no productive cough, No non-productive cough, No coughing up of blood.No change in color of mucus.No wheezing. Skin:  no rash or lesions. No jaundice GU:  no dysuria, change in color of urine, no urgency or frequency. No straining to urinate.  No flank pain.  Musculoskeletal:  No joint pain or no joint swelling. No decreased range of motion. No back pain.  Psych:  No change in mood or affect. No depression or anxiety. No memory loss.  Neuro: no localizing neurological complaints, no tingling, no weakness, no double vision, no gait abnormality, no slurred speech, no confusion  All systems reviewed and apart from [place] all are negative _______________________________________________________________________________________________ Past Medical History:   Past  Medical History:  Diagnosis Date   Arthritis    Cataracts, bilateral    Hyperlipidemia    Hypertension    Seasonal allergies    Sickle cell trait (HCC)        Past Surgical History:  Procedure Laterality Date   APPENDECTOMY     KNEE ARTHROSCOPY WITH ANTERIOR CRUCIATE LIGAMENT (ACL) REPAIR Left    LEFT HEART CATH AND CORONARY ANGIOGRAPHY N/A 05/11/2020   Procedure: LEFT HEART CATH AND CORONARY ANGIOGRAPHY;  Surgeon: Charolette Forward, MD;  Location: [place];  Service: Cardiovascular;  Laterality: N/A;   STENT PLACEMENT VASCULAR ([place] HX)      Social History:  Ambulatory   independently       reports that he has quit smoking. He quit smokeless tobacco use about 52 years ago.  His smokeless tobacco use included chew. He reports current alcohol use. He reports that he does not use drugs.     Family History:  Family History  Problem Relation Age of Onset   Colon cancer Neg Hx    Esophageal cancer Neg Hx    Rectal cancer Neg Hx    Stomach cancer Neg Hx    ______________________________________________________________________________________________ Allergies: Allergies  Allergen Reactions   Shellfish-Derived Products Anaphylaxis, Shortness Of Breath and Other (See Comments)    "My throat goes numb"     Prior to Admission medications   Medication Sig Start Date End Date Taking? Authorizing Provider  acetaminophen (TYLENOL) 325 MG tablet Take 2 tablets (650 mg total) by mouth every 4 (four) hours as needed for headache or mild pain. 05/11/20   Charolette Forward, MD  albuterol (VENTOLIN HFA) 108 (90 Base) MCG/ACT inhaler Inhale 2 puffs into the lungs every 4 (four) hours as needed for wheezing or shortness of breath.  07/02/19   [provider]  amLODipine (NORVASC) 2.5 MG tablet Take 1 tablet (2.5 mg total) by mouth daily. 03/27/21 03/27/22  Charolette Forward, MD  aspirin EC 81 MG EC tablet Take 1 tablet (81 mg total) by mouth daily. Swallow whole. 05/12/20   Charolette Forward, MD  atorvastatin (LIPITOR) 80 MG tablet Take 1 tablet (80 mg total) by mouth daily. 05/12/20   Charolette Forward, MD  finasteride (PROSCAR) 5 MG tablet Take 5  mg by mouth daily. 02/09/21   [provider]  Iron-FA-B Cmp-C-Biot-Probiotic (FUSION PLUS) CAPS Take 1 capsule by mouth daily. 03/17/21   [provider]  isosorbide mononitrate (IMDUR) 30 MG 24 hr tablet Take 30 mg by mouth daily. 04/06/20   [provider]  lidocaine (LIDODERM) 5 % Place 1 patch onto the skin daily as needed (pain). 12/28/20   [provider]  Menthol, Topical Analgesic, (BIOFREEZE) 4 % GEL Apply 1 application topically 4 (four) times daily as needed (pain).     [provider]  metoprolol succinate (TOPROL-XL) 25 MG 24 hr tablet Take 1 tablet (25 mg total) by mouth daily. 03/02/17   Gildardo Cranker, DO  nitroGLYCERIN (NITROLINGUAL) 0.4 MG/SPRAY spray Place 1 spray under the tongue every 5 (five) minutes as needed for chest pain.     [provider]  pantoprazole (PROTONIX) 40 MG tablet Take 1 tablet (40 mg total) by mouth daily at 6 (six) AM. 03/28/21   Charolette Forward, MD  pramipexole (MIRAPEX) 0.125 MG tablet Take 0.125 mg by mouth at bedtime. 03/29/20   [provider]  ranolazine (RANEXA) 500 MG 12 hr tablet Take 500 mg by mouth 2 (two) times daily. 12/30/20  [provider]  tamsulosin (FLOMAX) 0.4 MG CAPS capsule Take 0.8 mg by mouth at bedtime.  05/23/18   [provider]  thiamine 100 MG tablet Take 1 tablet by mouth daily.    [provider]  ticagrelor (BRILINTA) 90 MG TABS tablet Take 90 mg by mouth daily.    [provider]  vitamin C (ASCORBIC ACID) 500 MG tablet Take 500 mg by mouth daily.    [provider]    ___________________________________________________________________________________________________ Physical Exam:    11/15/2022   11:15 PM 11/15/2022   11:00 PM 11/15/2022   10:45 PM  Vitals with BMI  Systolic [id number]  Diastolic 94 90 87  Pulse 72 68 68     1. General:  in No  Acute distress   Chronically ill   -appearing 2. Psychological: Alert  and   Oriented 3. Head/ENT:   Dry Mucous Membranes                          Head Non traumatic, neck supple                        Poor Dentition 4. SKIN:  decreased Skin turgor,  Skin clean Dry and intact no rash 5. Heart: Regular rate and rhythm no  Murmur, no Rub or gallop 6. Lungs: , no wheezes or crackles   7. Abdomen: Soft,  non-tender, Non distended bowel sounds present 8. Lower extremities: no clubbing, cyanosis, no  edema 9. Neurologically Grossly intact, moving all 4 extremities equally   10. MSK: Normal range of motion    Chart has been reviewed  ______________________________________________________________________________________________  Assessment/Plan 72 y.o. male with medical history significant of CAD sp stents ,HTN, HLD, tobacco abuse, history of alcohol abuse sickle cell trait    Admitted for   NSTEMI (non-ST elevated myocardial infarction) ([place])     Present on Admission:  NSTEMI (non-ST elevated myocardial infarction) ([place])  Essential hypertension  Hyperlipidemia  Tobacco abuse  Hypokalemia     NSTEMI (non-ST elevated myocardial infarction) [facility]) Cardiology aware Dr. Einar Gip is following closely.  Recommend heparin Aggrastat drip and beta-blocker np.o. for midnight For cardiac catheterization in the morning.  If patient develops severe chest pain again please call cardiology immediately Currently hemodynamically stable Reports chest pain is mostly gone  Essential hypertension Continue   Toprol 25 mg p.o. daily  Hyperlipidemia Continue Lipitor at 80 mg p.o. daily  Tobacco abuse Spoke about importance of quitting   Hypokalemia Will replace    Other plan as per orders.  DVT prophylaxis: heparin    Code Status:    Code Status: Prior FULL CODE  as per patient   I had personally discussed CODE STATUS with patient      Family Communication:   Family not at  Bedside    Disposition Plan:    To home once workup is complete and patient is stable    Following barriers for discharge:                                                       Will need consultants to evaluate patient prior to discharge  Consults called:  cardiology is aware    Admission status:  ED Disposition     ED Disposition  Admit   Condition  --   [place]: [place] [[id number]]  Level of Care: Progressive [102]  Admit to Progressive based on following criteria: CARDIOVASCULAR & THORACIC of moderate stability with acute coronary syndrome symptoms/low risk myocardial infarction/hypertensive urgency/arrhythmias/heart failure potentially compromising stability and stable post cardiovascular intervention patients.  May admit patient to [facility] or [facility] if equivalent level of care is available:: No  Covid Evaluation: Asymptomatic - no recent exposure (last 10 days) testing not required  Diagnosis: NSTEM[facility] (non-ST elevated myocardial infarction) [id number]  Admitting Physician: [place], [place]  Attending Physician: Toy Baker [id number]  Certification:: I certify this patient will need inpatient services for at least 2 midnights  Estimated Length of Stay: 2            inpatient     I Expect 2 midnight stay secondary to severity of patient's current illness need for inpatient interventions justified by the following:     Severe lab/radiological/exam abnormalities including:   NSTEMI and extensive comorbidities including:   CAD   That are currently affecting medical management.   I expect  patient to be hospitalized for 2 midnights requiring inpatient medical care.  Patient is at high risk for adverse outcome (such as loss of life or disability) if not treated.  Indication for inpatient stay as follows:    Need for operative/procedural  intervention     Need for IV heparin IV Aggrastat   Level of care        progressive tele indefinitely please discontinue once  patient no longer qualifies COVID-19 Labs     Zaveon Gillen 11/16/2022, 1:01 AM    [facility]     after 2 AM please page floor coverage PA If 7AM-7PM, please contact the day team taking care of the patient using [email or web address]   Patient was evaluated in the context of the global COVID-19 pandemic, which necessitated consideration that the patient might be at risk for infection with the SARS-CoV-2 virus that causes COVID-19. Institutional protocols and algorithms that pertain to the evaluation of patients at risk for COVID-19 are in a state of rapid change based on information released by regulatory bodies including the CDC and federal and state organizations. These policies and algorithms were followed during the patient's care.

## 2022-11-16 NOTE — Consult Note (Addendum)
CARDIOLOGY CONSULT NOTE  Patient ID: [name] [name] MRN: [id number] DOB/AGE: 70-Sep-1952 70 y.o.  Admit date: 11/15/2022 Referring Physician  Dwyane Dee Primary Physician:  Audley Hose, MD Reason for Consultation  NSTEMI  Patient ID: [name] [name], male    DOB: Jun 10, 1951, 72 y.o.   MRN: [id number]  Chief Complaint  Patient presents with   Chest Pain   HPI:    [name] [name]  is a 72 y.o. coronary artery disease status post PCI to LAD in the past, hypertension, hyperlipidemia, tobacco abuse, osteoarthritis, sickle cell trait, last cardiac catheterization and angioplasty to the LAD in 2021 and had thrombotic restenosis in the proximal LAD leading to repeat stenting and also stenting to the distal LAD, patient has been on chronic Brilinta.  I was requested by Dr. Terrence Dupont evaluate the patient for possible angioplasty as well.  Over the past few days he has been having occasional episodes of chest pain, however yesterday started having chest discomfort similar to his angina and he took sublingual nitroglycerin without much relief and also had diaphoresis associated with with radiation of the chest pain to the left arm, hence presented to the emergency room.  Upon presentation, chest pain symptoms have improved, his cardiac markers were positive for non-STEMI.  Patient admitted overnight with heparin also Aggrastat.  Patient states that this morning chest pain is better, had mild chest discomfort last night as well but states that since being in the hospital overall he feels better.  No other associated symptoms presently.  States that he has been compliant with all his medications.  Past Medical History:  Diagnosis Date   Arthritis    Cataracts, bilateral    Hyperlipidemia    Hypertension    Seasonal allergies    Sickle cell trait ([place])    Past Surgical History:  Procedure Laterality Date   APPENDECTOMY     KNEE ARTHROSCOPY WITH ANTERIOR CRUCIATE LIGAMENT (ACL) REPAIR  Left    LEFT HEART CATH AND CORONARY ANGIOGRAPHY N/A 05/11/2020   Procedure: LEFT HEART CATH AND CORONARY ANGIOGRAPHY;  Surgeon: Charolette Forward, MD;  Location: [facility];  Service: Cardiovascular;  Laterality: N/A;   STENT PLACEMENT VASCULAR ([place] HX)     Social History   Tobacco Use   Smoking status: Former   Smokeless tobacco: Former    Types: Chew    Quit date: 09/18/1970  Substance Use Topics   Alcohol use: Yes    Comment: occas    Family History  Problem Relation Age of Onset   Colon cancer Neg Hx    Esophageal cancer Neg Hx    Rectal cancer Neg Hx    Stomach cancer Neg Hx     Marital Status: Legally Separated  ROS  Review of Systems  Cardiovascular:  Positive for chest pain. Negative for dyspnea on exertion and leg swelling.   Objective      11/16/2022    6:09 AM 11/16/2022    1:33 AM 11/15/2022   11:15 PM  Vitals with BMI  Systolic [id number]  Diastolic 97 81 94  Pulse 72 80 72    Blood pressure (!) 169/97, pulse 72, temperature 98.2 F (36.8 C), temperature source Oral, resp. rate 20, height '5\' 11"'$  (1.803 m), weight 73.9 kg, SpO2 99 %.   Physical Exam Neck:     Vascular: Carotid bruit (bilateral carotid bruit, L>R) present. No JVD.  Cardiovascular:     Rate and Rhythm: Normal rate and regular rhythm.  Pulses: Intact distal pulses.     Heart sounds: Normal heart sounds. No murmur heard.    No gallop.  Pulmonary:     Effort: Pulmonary effort is normal.     Breath sounds: Normal breath sounds.  Abdominal:     General: Bowel sounds are normal.     Palpations: Abdomen is soft.  Musculoskeletal:     Right lower leg: No edema.     Left lower leg: No edema.    Laboratory examination:   Recent Labs    11/15/22 2029 11/16/22 0240  NA 134* 138  K 3.1* 3.7  CL 102 104  CO2 22 25  GLUCOSE 101* 109*  BUN 5* 8  CREATININE 0.80 0.84  CALCIUM 9.0 8.9  GFRNONAA >60 >60      Latest Ref Rng & Units 11/16/2022    2:40 AM 11/16/2022   12:46 AM  11/15/2022    8:29 PM  CMP  Glucose 70 - 99 mg/dL 109   101   BUN 8 - 23 mg/dL 8   5   Creatinine 0.61 - 1.24 mg/dL 0.84   0.80   Sodium 135 - 145 mmol/L 138   134   Potassium 3.5 - 5.1 mmol/L 3.7   3.1   Chloride 98 - 111 mmol/L 104   102   CO2 22 - 32 mmol/L 25   22   Calcium 8.9 - 10.3 mg/dL 8.9   9.0   Total Protein 6.5 - 8.1 g/dL 6.9  7.4    Total Bilirubin 0.3 - 1.2 mg/dL 1.0  0.8    Alkaline Phos 38 - 126 U/L 77  80    AST 15 - 41 U/L 39  38    ALT 0 - 44 U/L 20  21        Latest Ref Rng & Units 11/16/2022    2:40 AM 11/15/2022    8:29 PM 03/27/2021    5:31 AM  CBC  WBC 4.0 - 10.5 K/uL 8.2  8.9  6.6   Hemoglobin 13.0 - 17.0 g/dL 11.8  12.1  10.4   Hematocrit 39.0 - 52.0 % 35.5  36.5  31.7   Platelets 150 - 400 K/uL 169  168  174    Lipid Panel Recent Labs    11/16/22 0240  CHOL 85  TRIG 67  LDLCALC 30  VLDL 13  HDL 42  CHOLHDL 2.0    HEMOGLOBIN A1C No results found for: "HGBA1C", "MPG" TSH Recent Labs    11/16/22 0046  TSH 3.249   BNP (last 3 results) No results for input(s): "BNP" in the last 8760 hours. Cardiac Panel (last 3 results) Recent Labs    11/15/22 2207 11/16/22 0046 11/16/22 0240  CKTOTAL  --  230  --   TROPONINIHS 1,026* 3,023* 3,784*     Medications and allergies   Allergies  Allergen Reactions   Shellfish-Derived Products Anaphylaxis, Shortness Of Breath and Other (See Comments)    "My throat goes numb"     No outpatient medications have been marked as taking for the 11/15/22 encounter [facility] Encounter).    Scheduled Meds:  aspirin  81 mg Oral Pre-Cath   aspirin EC  81 mg Oral Daily   atorvastatin  80 mg Oral Daily   metoprolol succinate  25 mg Oral Daily   pantoprazole  40 mg Oral Q0600   ranolazine  500 mg Oral BID   sodium chloride flush  3 mL Intravenous Q12H   sodium  chloride flush  3 mL Intravenous Q12H   Continuous Infusions:  sodium chloride     sodium chloride 1 mL/kg/hr (11/16/22 0521)   heparin 900  Units/hr (11/16/22 0052)   potassium chloride 10 mEq (11/16/22 0521)   tirofiban 0.15 mcg/kg/min (11/16/22 0520)   PRN Meds:.sodium chloride, acetaminophen, ALPRAZolam, morphine injection, nitroGLYCERIN, ondansetron (ZOFRAN) IV, sodium chloride flush   No intake/output data recorded. Total I/O In: -  Out: 900 [Urine:900]  Net IO Since Admission: -900 mL [11/16/22 0618]   Radiology:   DG Chest 2 View  Result Date: 11/15/2022 CLINICAL DATA:  Chest pain. EXAM: CHEST - 2 VIEW COMPARISON:  March 25, 2021 FINDINGS: The heart size and mediastinal contours are within normal limits. A coronary artery stent is seen. Both lungs are clear. The visualized skeletal structures are unremarkable. IMPRESSION: No active cardiopulmonary disease. Electronically Signed   By: Virgina Norfolk M.D.   On: 11/15/2022 21:24    Cardiac Studies:   Echocardiogram 03/30/2019:   1. The left ventricle has normal systolic function, with an ejection fraction of 55-60%. The cavity size was normal. No evidence of left ventricular regional wall motion abnormalities.  2. The right ventricle has normal systolc function. There is no increase in right ventricular wall thickness. Right ventricular systolic pressure is normal.  3. The aortic valve is tricuspid Mild thickening of the aortic valve. No stenosis of the aortic valve.   FINDINGS  Left Ventricle: The left ventricle has normal systolic function, with an ejection fraction of 55-60%. The cavity size was normal. There is no increase in left ventricular wall thickness. No evidence of left ventricular regional wall motion  abnormalities.  Left Heart Catheterization 05/11/2020:  Left Anterior Descending  Prox LAD to Mid LAD lesion is 20% stenosed. The lesion was previously treated using a drug eluting stent between 1-5 months ago.    Ramus Intermedius  Ramus lesion is 20% stenosed.    Left Circumflex  Ost Cx to Prox Cx lesion is 20% stenosed.    First Obtuse Marginal  Branch  Vessel is small in size.    Right Coronary Artery  Prox RCA to Mid RCA lesion is 40% stenosed.        Left Heart Catheterization 03/17/2020: 3.0 mm, X 15 mm Resolute Onyx DES was placed in the Proximal left anterior descending, inside previously placed stents   2.5 mm, X 8 mm, & 15 mm Resolute Onyx DES were placed in the Distal left  anterior descending, distal to previously placed stents.  A second stent had to be deployed due to geographic miss from balloon migration   Regadenoson Nuclear stress test 03/26/2021: 1. No reversible ischemia or infarction. 2. Mild global hypokinesis 3. Left ventricular ejection fraction 41% 4. Non invasive risk stratification*: Intermediate-based on ejection fraction.  EKG:  EKG 11/15/2022: Normal sinus rhythm at rate of 71 bpm, normal axis, early repolarization.  Compared to 03/27/2021, no change.  Assessment   1.  NSTEMI 2.  Coronary artery disease native vessel with unstable angina, history of LAD proximal stenting for in-stent thrombotic lesion in June 2021, LAD distal lesion stented as well. 3.  Hypercholesteremia 4.  Primary hypertension 5.  Tobacco use disorder, patient smokes cigars. 6.  Bilateral Carotid bruit left greater than the right, will need follow-up Dopplers.  Recommendations:   Patient is compliant with all his medications, presented with NSTEMI.  Concerned about either recurrent restenosis in the LAD or new lesions, I reviewed his external cardiac catheterization report from  [facility].  Will schedule him for cardiac catheterization, patient is willing to proceed. Schedule for cardiac catheterization, and possible angioplasty. We discussed regarding risks, benefits, alternatives to this including stress testing, CTA and continued medical therapy. Patient wants to proceed. Understands <1-2% risk of death, stroke, MI, urgent CABG, bleeding, infection, renal failure but not limited to these.    Patient has been compliant with all his medications, presently on dual antiplatelet therapy with aspirin and Brilinta.  Nuclear topical ischemia, he is on high intensity statins with LDL goal achieved.  Blood pressure is elevated, I will address this after cardiac catheterization.  I discussed with the patient regarding smoking cessation as well.   Adrian Prows, MD, [facility] 11/16/2022, 6:18 AM Office: [phone]

## 2022-11-16 NOTE — Discharge Summary (Addendum)
Physician Discharge Summary   [name] [name] [id number] DOB: 05/18/1951 DOA: 11/15/2022  PCP: Audley Hose, MD  Admit date: 11/15/2022 Discharge date: 11/16/2022  Admitted From: Home Disposition:  Home Discharging physician: Dwyane Dee, MD Barriers to discharge:   Recommendations for Outpatient Follow-up:  Followup with Dr. Jonathon Bellows up BP. Amlodipine increased, but may be due to cocaine presence   Discharge Condition: stable CODE STATUS: Full Diet recommendation:  Diet Orders (From admission, onward)     Start     Ordered   11/16/22 0858  Diet Heart Room service appropriate? Yes; Fluid consistency: Thin  Diet effective now       Question Answer Comment  Room service appropriate? Yes   Fluid consistency: Thin      11/16/22 0857   11/16/22 0000  Diet - low sodium heart healthy        11/16/22 1104            Hospital Course: [name] [name] is a 72 yo male with PMH CAD, HTN, HLD, [place] trait, arthritis who presented with chest pain. He was admitted for further workup.  Of note, UDS was positive for cocaine.  When asked about this, he states he had intended to smoke marijuana and was unaware that there was cocaine present. Troponin was 142 on admission which up trended to over 3000.  He was started on a heparin drip and Aggrastat; cardiology was consulted. He underwent catheterization on 11/16/2022 which showed patent vessels with no significant stenosis requiring stenting however there was mild neointimal hyperplasia concerning for presence of thrombus formation.  He was continued on aspirin for 30 days with addition of Plavix for 1 year or indefinitely per cardiology and also started on Xarelto 10 mg daily to prevent thrombus formation. Amlodipine was also increased and may need readjustment at follow-up if cocaine was contributing to hypertension on admission. Patient improved faster than anticipated after procedures and was considered stable for discharging  home.    The patient's chronic medical conditions were treated accordingly per the patient's home medication regimen except as noted.  On day of discharge, patient was felt deemed stable for discharge. Patient/family member advised to call PCP or come back to ER if needed.   Principal Diagnosis: NSTEMI (non-ST elevated myocardial infarction) [facility])  Discharge Diagnoses: Active Hospital Problems   Diagnosis Date Noted   NSTEMI (non-ST elevated myocardial infarction) ([place]) 11/16/2022   Essential hypertension 11/16/2022   Hyperlipidemia 11/16/2022   Tobacco abuse 11/16/2022   Hypokalemia 11/16/2022   Coronary artery disease involving native coronary artery of native heart with unstable angina pectoris ([place]) 11/16/2022   Coronary arteriosclerosis after percutaneous transluminal coronary angioplasty (PTCA) 11/16/2022    Resolved Hospital Problems  No resolved problems to display.     Discharge Instructions     AMB referral to Phase II Cardiac Rehabilitation   Complete by: As directed    Diagnosis: NSTEMI   After initial evaluation and assessments completed: Virtual Based Care may be provided alone or in conjunction with Phase 2 Cardiac Rehab based on patient barriers.: Yes   Intensive Cardiac Rehabilitation (ICR) [place] location only OR Traditional Cardiac Rehabilitation (TCR) *If criteria for ICR are not met will enroll in TCR [facility] only): Yes   Diet - low sodium heart healthy   Complete by: As directed    Increase activity slowly   Complete by: As directed       Allergies as of 11/16/2022       Reactions  Shellfish-derived Products Anaphylaxis, Shortness Of Breath, Other (See Comments)   "My throat goes numb"        Medication List     STOP taking these medications    Jardiance 10 MG Tabs tablet Generic drug: empagliflozin   ranolazine 500 MG 12 hr tablet Commonly known as: RANEXA   ticagrelor 90 MG Tabs tablet Commonly known as: BRILINTA       TAKE these  medications    acetaminophen 325 MG tablet Commonly known as: TYLENOL Take 2 tablets (650 mg total) by mouth every 4 (four) hours as needed for headache or mild pain.   albuterol 108 (90 Base) MCG/ACT inhaler Commonly known as: VENTOLIN HFA Inhale 2 puffs into the lungs every 4 (four) hours as needed for wheezing or shortness of breath.   amLODipine 10 MG tablet Commonly known as: NORVASC Take 1 tablet (10 mg total) by mouth daily. What changed:  medication strength how much to take   ascorbic acid 500 MG tablet Commonly known as: VITAMIN C Take 500 mg by mouth daily.   aspirin EC 81 MG tablet Take 1 tablet (81 mg total) by mouth daily. Swallow whole.   atorvastatin 80 MG tablet Commonly known as: LIPITOR Take 1 tablet (80 mg total) by mouth daily.   Biofreeze 4 % Gel Generic drug: Menthol (Topical Analgesic) Apply 1 application topically 4 (four) times daily as needed (pain).   clopidogrel 75 MG tablet Commonly known as: Plavix Take 1 tablet (75 mg total) by mouth daily.   finasteride 5 MG tablet Commonly known as: PROSCAR Take 5 mg by mouth daily.   Fusion Plus Caps Take 1 capsule by mouth daily.   isosorbide mononitrate 30 MG 24 hr tablet Commonly known as: IMDUR Take 30 mg by mouth daily.   lidocaine 5 % Commonly known as: LIDODERM Place 1 patch onto the skin daily as needed (pain).   metoprolol succinate 25 MG 24 hr tablet Commonly known as: TOPROL-XL Take 1 tablet (25 mg total) by mouth daily.   nitroGLYCERIN 0.4 MG/SPRAY spray Commonly known as: NITROLINGUAL Place 1 spray under the tongue every 5 (five) minutes as needed for chest pain.   pantoprazole 40 MG tablet Commonly known as: PROTONIX Take 1 tablet (40 mg total) by mouth daily at 6 (six) AM.   pramipexole 0.125 MG tablet Commonly known as: MIRAPEX Take 0.125 mg by mouth at bedtime.   rivaroxaban 10 MG Tabs tablet Commonly known as: XARELTO Take 1 tablet (10 mg total) by mouth daily.    tamsulosin 0.4 MG Caps capsule Commonly known as: FLOMAX Take 0.8 mg by mouth at bedtime.   thiamine 100 MG tablet Commonly known as: VITAMIN B1 Take 1 tablet by mouth daily.        Follow-up Information     Bakare, Larey Dresser, MD Follow up.   Specialty: Internal Medicine Contact information: [place] [address] [phone]                Allergies  Allergen Reactions   Shellfish-Derived Products Anaphylaxis, Shortness Of Breath and Other (See Comments)    "My throat goes numb"    Consultations: Cardiology  Procedures: 11/16/22: [place]  Discharge Exam: BP (!) 153/81   Pulse 61   Temp 98.1 F (36.7 C) (Oral)   Resp 18   Ht '5\' 11"'$  (1.803 m)   Wt 73.9 kg   SpO2 98%   BMI 22.73 kg/m  Physical Exam Constitutional:  General: He is not in acute distress.    Appearance: Normal appearance.  HENT:     Head: Normocephalic and atraumatic.     Mouth/Throat:     Mouth: Mucous membranes are moist.  Eyes:     Extraocular Movements: Extraocular movements intact.  Cardiovascular:     Rate and Rhythm: Normal rate and regular rhythm.     Heart sounds: Normal heart sounds.  Pulmonary:     Effort: Pulmonary effort is normal. No respiratory distress.     Breath sounds: Normal breath sounds. No wheezing.  Abdominal:     General: Bowel sounds are normal. There is no distension.     Palpations: Abdomen is soft.     Tenderness: There is no abdominal tenderness.  Musculoskeletal:        General: Normal range of motion.     Cervical back: Normal range of motion and neck supple.  Skin:    General: Skin is warm and dry.  Neurological:     General: No focal deficit present.     Mental Status: He is alert.  Psychiatric:        Mood and Affect: Mood normal.        Behavior: Behavior normal.      The results of significant diagnostics from this hospitalization (including imaging, microbiology, ancillary and laboratory) are listed  below for reference.   Microbiology: No results found for this or any previous visit (from the past 240 hour(s)).   Labs: BNP (last 3 results) No results for input(s): "BNP" in the last 8760 hours. Basic Metabolic Panel: Recent Labs  Lab 11/15/22 2029 11/16/22 0046 11/16/22 0240  NA 134*  --  138  K 3.1*  --  3.7  CL 102  --  104  CO2 22  --  25  GLUCOSE 101*  --  109*  BUN 5*  --  8  CREATININE 0.80  --  0.84  CALCIUM 9.0  --  8.9  MG  --  1.9 2.0  PHOS  --  4.1  --    Liver Function Tests: Recent Labs  Lab 11/16/22 0046 11/16/22 0240  AST 38 39  ALT 21 20  ALKPHOS 80 77  BILITOT 0.8 1.0  PROT 7.4 6.9  ALBUMIN 3.7 3.7   No results for input(s): "LIPASE", "AMYLASE" in the last 168 hours. No results for input(s): "AMMONIA" in the last 168 hours. CBC: Recent Labs  Lab 11/15/22 2029 11/16/22 0240  WBC 8.9 8.2  NEUTROABS  --  5.4  HGB 12.1* 11.8*  HCT 36.5* 35.5*  MCV 89.0 86.0  PLT 168 169   Cardiac Enzymes: Recent Labs  Lab 11/16/22 0046  CKTOTAL 230   BNP: Invalid input(s): "POCBNP" CBG: No results for input(s): "GLUCAP" in the last 168 hours. D-Dimer Recent Labs    11/16/22 0240  DDIMER 1.61*   Hgb A1c No results for input(s): "HGBA1C" in the last 72 hours. Lipid Profile Recent Labs    11/16/22 0240  CHOL 85  HDL 42  LDLCALC 30  TRIG 67  CHOLHDL 2.0   Thyroid function studies Recent Labs    11/16/22 0046  TSH 3.249   Anemia work up No results for input(s): "VITAMINB12", "FOLATE", "FERRITIN", "TIBC", "IRON", "RETICCTPCT" in the last 72 hours. Urinalysis    Component Value Date/Time   COLORURINE YELLOW 03/28/2019 1338   APPEARANCEUR CLEAR 03/28/2019 1338   LABSPEC 1.012 03/28/2019 1338   PHURINE 6.0 03/28/2019 1338   GLUCOSEU NEGATIVE 03/28/2019  [place] (A) 03/28/2019 [place] 03/28/2019 1338   KETONESUR 5 (A) 03/28/2019 [place] 03/28/2019 1338   NITRITE NEGATIVE  03/28/2019 1338   LEUKOCYTESUR NEGATIVE 03/28/2019 1338   Sepsis Labs Recent Labs  Lab 11/15/22 2029 11/16/22 0240  WBC 8.9 8.2   Microbiology No results found for this or any previous visit (from the past 240 hour(s)).  Procedures/Studies: CARDIAC CATHETERIZATION  Addendum Date: 11/16/2022   Left Heart Catheterization 11/16/22: LV: 143/1, EDP 9 mmHg.  Ao 09/18/1934/68, mean 100 mmHg.  No pressure gradient across the aortic valve. LVEF 60 to 65% without significant mitral regurgitation.  No wall motion abnormality. LM: Short, smooth and normal. LAD: Ostium widely patent.  Proximal, mid and mid to distal segment has multiple overlapping stents with mild neointimal hyperplasia.  At the mid to distal segment where there is stent within the stent, lumen is slightly hazy and irregular.  Overall patent stents with minimal 10 to 20% luminal narrowing.  Multiple small diagonals. RI: Moderate caliber vessel with a proximal 20% stenosis. Cx: Proximal CX has a 20 to 30% stenosis.  Gives origin to large OM1 which has a proximal 20% stenosis.  OM 2 is small and OM 3 is large. RCA: Large-caliber vessel.  Mid segment has a 40% stenosis.  This lesion is unchanged from 2021. Impression: Widely patent LAD stent placed in 2021, proximal and mid to distal segment of the LAD has a stent within the stent.  There is mild neointimal hyperplasia.  Suspect thrombus formation in 1 of these areas which is resolved with heparin and Aggrastat. Recommendation: Aspirin for 30 days, Plavix for 1 year or indefinitely, will add Xarelto 10 mg daily to prevent thrombus formation.  Amlodipine increased to 10 mg daily for hypertension and also angina pectoris, discontinued Ranexa and also Jardiance. Patient can follow-up with his primary cardiologist Dr. Terrence Dupont.  70 mL contrast utilized. Can be discharged today. D/W Dr. Jacqlyn Larsen, MD, [facility] 11/16/2022, 8:38 AM Office: [phone] Fax: [phone] Pager: [phone]   Result  Date: 11/16/2022 Images from the original result were not included. Left Heart Catheterization 11/16/22: LV: 143/1, EDP 9 mmHg.  Ao 09/18[birth year]1936/68, mean 100 mmHg.  No pressure gradient across the aortic valve. LVEF 60 to 65% without significant mitral regurgitation.  No wall motion abnormality. LM: Short, smooth and normal. LAD: Ostium widely patent.  Proximal, mid and mid to distal segment has multiple overlapping stents with mild neointimal hyperplasia.  At the mid to distal segment where there is stent within the stent, lumen is slightly hazy and irregular.  Overall patent stents with minimal 10 to 20% luminal narrowing.  Multiple small diagonals. RI: Moderate caliber vessel with a proximal 20% stenosis. Cx: Proximal CX has a 20 to 30% stenosis.  Gives origin to large OM1 which has a proximal 20% stenosis.  OM 2 is small and OM 3 is large. RCA: Large-caliber vessel.  Mid segment has a 40% stenosis.  This lesion is unchanged from 2021. Impression: Widely patent LAD stent placed in 2021, proximal and mid to distal segment of the LAD has a stent within the stent.  There is mild neointimal hyperplasia.  Suspect thrombus formation in 1 of these areas which is resolved with heparin and Aggrastat. Recommendation: Aspirin for 30 days, Plavix for 1 year or indefinitely, will add Xarelto 10 mg daily to prevent thrombus formation.  Amlodipine increased to 10 mg daily for hypertension and also angina pectoris,  discontinued phrynolysin and also Jardiance. Patient can follow-up with his primary cardiologist in [facility].  70 mL contrast utilized. Adrian Prows, MD, [facility] 11/16/2022, 8:38 AM Office: [phone] Fax: [phone] Pager: [phone]   DG Chest 2 View  Result Date: 11/15/2022 CLINICAL DATA:  Chest pain. EXAM: CHEST - 2 VIEW COMPARISON:  March 25, 2021 FINDINGS: The heart size and mediastinal contours are within normal limits. A coronary artery stent is seen. Both lungs are clear. The visualized skeletal structures are  unremarkable. IMPRESSION: No active cardiopulmonary disease. Electronically Signed   By: Virgina Norfolk M.D.   On: 11/15/2022 21:24     Time coordinating discharge: Over 30 minutes    Dwyane Dee, MD  [facility] 11/16/2022, 12:27 PM

## 2022-11-16 NOTE — Assessment & Plan Note (Signed)
Continue Toprol 25 mg p.o. daily 

## 2022-11-16 NOTE — Progress Notes (Signed)
Upon review of his chart, patient has chronic chest pain syndrome as well.  Today he is complaining of 6 out of 10 chest pain post catheterization, but still appears very comfortable as per nursing, will give 1 dose of fentanyl, sublingual nitroglycerin also be given, I will repeat EKG.  I do not anticipate recurrence of ACS.

## 2022-11-17 LAB — HEMOGLOBIN A1C
Hgb A1c MFr Bld: 4.9 % (ref 4.8–5.6)
Mean Plasma Glucose: 94 mg/dL

## 2022-11-18 LAB — LIPOPROTEIN A (LPA): Lipoprotein (a): 310.2 nmol/L — ABNORMAL HIGH (ref <75.0)

## 2022-11-21 ENCOUNTER — Telehealth (HOSPITAL_COMMUNITY): Payer: Self-pay | Admitting: *Deleted

## 2022-11-21 NOTE — Telephone Encounter (Signed)
Unable to reach pt via telephone. Will mail post nstemi written education materials to address on file.    [name] [name]

## 2022-11-22 ENCOUNTER — Other Ambulatory Visit: Payer: Self-pay | Admitting: Internal Medicine

## 2022-11-22 DIAGNOSIS — I6529 Occlusion and stenosis of unspecified carotid artery: Secondary | ICD-10-CM

## 2022-12-20 ENCOUNTER — Inpatient Hospital Stay: Admission: RE | Admit: 2022-12-20 | Payer: Medicare HMO | Source: Ambulatory Visit

## 2022-12-20 ENCOUNTER — Other Ambulatory Visit: Payer: Medicare HMO

## 2023-01-10 ENCOUNTER — Ambulatory Visit
Admission: RE | Admit: 2023-01-10 | Discharge: 2023-01-10 | Disposition: A | Discharge status: Home / Self Care | Payer: Medicare HMO | Source: Ambulatory Visit | Attending: Internal Medicine | Admitting: Internal Medicine

## 2023-01-10 DIAGNOSIS — I6529 Occlusion and stenosis of unspecified carotid artery: Secondary | ICD-10-CM

## 2023-06-11 ENCOUNTER — Other Ambulatory Visit: Payer: Self-pay | Admitting: Internal Medicine

## 2023-06-11 DIAGNOSIS — I6523 Occlusion and stenosis of bilateral carotid arteries: Secondary | ICD-10-CM

## 2023-06-21 ENCOUNTER — Ambulatory Visit
Admission: RE | Admit: 2023-06-21 | Discharge: 2023-06-21 | Disposition: A | Discharge status: Home / Self Care | Payer: Medicare HMO | Source: Ambulatory Visit | Attending: Internal Medicine | Admitting: Internal Medicine

## 2023-06-21 DIAGNOSIS — I6523 Occlusion and stenosis of bilateral carotid arteries: Secondary | ICD-10-CM

## 2023-06-27 NOTE — Progress Notes (Unsigned)
Office Note     CC: Left-sided common carotid artery occlusion Requesting Provider:  Harvest Forest, MD  HPI: [name] [name] is a 71 y.o. 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  In July of this year he presented to [facility] ED with concern for acute aortic syndrome.  CT angio chest abdomen pelvis followed demonstrating incidental finding of left-sided common carotid artery occlusion.  Initially, the carotid was widely patent and April.  On exam, [name] [name] was doing well.  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  [name] [name] has CAD with 6 coronary stents.  He states his main complaint is intermittent chest pain for which he takes nitroglycerin spray.  When probed further, [name] [name] notes episodes of chest pressure which occur daily with pain and numbness that radiate into the left neck and left arm.  All symptoms resolved with the use of nitroglycerin spray.  In some cases, [name] [name] states that he is using the spray up to 3 times daily.  [name] [name] denies classic symptoms of TIA, stroke, amaurosis.  He has had syncopal episodes in the past that he says occur when he stands up too quickly.  The last episode occurred 3 months ago.  Chart check revealed prior stenting with anterior wall STEMI ~ 6/21 with DES to prox/distal LAD (3 stents), EF > 70%. LHC in August 2021 which showed 20 to 30% in-stent restenosis in the proximal LAD, no intervention performed. Negative NMST in 2022. Recent admission at [facility] due to NSTEMI in the setting of cocaine use with LHC on 10/2022 showing patent vessels, no significant stenosis requiring stenting with mild neointimal hyperplasia concerning for presence of thrombus formation---started on [name] [name]. "    Past Medical History:  Diagnosis Date   Arthritis    Cataracts, bilateral    Hyperlipidemia    Hypertension    Seasonal allergies     Sickle cell trait (HCC)     Past Surgical History:  Procedure Laterality Date   APPENDECTOMY     KNEE ARTHROSCOPY WITH ANTERIOR CRUCIATE LIGAMENT (ACL) REPAIR Left    LEFT HEART CATH AND CORONARY ANGIOGRAPHY N/A 05/11/2020   Procedure: LEFT HEART CATH AND CORONARY ANGIOGRAPHY;  Surgeon: Rinaldo Cloud, MD;  Location: [facility] INVASIVE CV LAB;  Service: Cardiovascular;  Laterality: N/A;   LEFT HEART CATH AND CORONARY ANGIOGRAPHY N/A 11/16/2022   Procedure: LEFT HEART CATH AND CORONARY ANGIOGRAPHY;  Surgeon: Yates Decamp, MD;  Location: [facility] INVASIVE CV LAB;  Service: Cardiovascular;  Laterality: N/A;  FIrst case, 7:30 AM   STENT PLACEMENT VASCULAR ([facility] HX)      Social History   Socioeconomic History   Marital status: Legally Separated    Spouse name: Not on file   Number of children: Not on file   Years of education: Not on file   Highest education level: Not on file  Occupational History   Occupation: RN at [employer]  Tobacco Use   Smoking status: Former   Smokeless tobacco: Former    Types: Chew    Quit date: 09/18/1970  Vaping Use   Vaping status: Never Used  Substance and Sexual Activity   Alcohol use: Yes    Comment: occas   Drug use: No   Sexual activity: Never  Other Topics Concern   Not on file  Social History Narrative   Social History      Diet? regular  Do you drink/eat things with caffeine?      Marital status?               separated                     What year were you married? 1982      Do you live in a house, apartment, assisted living, condo, trailer, etc.?  (house circled on new patient packet)      Is it one or more stories? yes      How many persons live in your home? 1      Do you have any pets in your home? (please list) no      Highest level of education completed?      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         no                             Type & how often?      Do you  have a living will? Yes      Do you have a DNR form?      Yes                            If not, do you want to discuss one?      Do you have signed POA/HPOA for forms? yes      Functional Status      Do you have difficulty bathing or dressing yourself? no      Do you have difficulty preparing food or eating?  no      Do you have difficulty managing your medications? no      Do you have difficulty managing your finances? no      Do you have difficulty affording your medications? no   Social Determinants of Health   Financial Resource Strain: Not on file  Food Insecurity: Not on file  Transportation Needs: Not on file  Physical Activity: Not on file  Stress: Not on file  Social Connections: Not on file  Intimate Partner Violence: Not on file   Family History  Problem Relation Age of Onset   Colon cancer Neg Hx    Esophageal cancer Neg Hx    Rectal cancer Neg Hx    Stomach cancer Neg Hx     Current Outpatient Medications  Medication Sig Dispense Refill   acetaminophen (TYLENOL) 325 MG tablet Take 2 tablets (650 mg total) by mouth every 4 (four) hours as needed for headache or mild pain. 60 tablet 1   albuterol (VENTOLIN HFA) 108 (90 Base) MCG/ACT inhaler Inhale 2 puffs into the lungs every 4 (four) hours as needed for wheezing or shortness of breath.      amLODipine (NORVASC) 10 MG tablet Take 1 tablet (10 mg total) by mouth daily. 90 tablet 0   atorvastatin (LIPITOR) 80 MG tablet Take 1 tablet (80 mg total) by mouth daily. 30 tablet 3   clopidogrel (PLAVIX) 75 MG tablet Take 1 tablet (75 mg total) by mouth daily. 30 tablet 3   finasteride (PROSCAR) 5 MG tablet Take 5 mg by mouth daily.     Iron-FA-B Cmp-C-Biot-Probiotic (FUSION PLUS) CAPS Take 1 capsule by mouth daily.  isosorbide mononitrate (IMDUR) 30 MG 24 hr tablet Take 30 mg by mouth daily.     lidocaine (LIDODERM) 5 % Place 1 patch onto the skin daily as needed (pain).     Menthol, Topical Analgesic, (BIOFREEZE)  4 % GEL Apply 1 application topically 4 (four) times daily as needed (pain).      metoprolol succinate (TOPROL-XL) 25 MG 24 hr tablet Take 1 tablet (25 mg total) by mouth daily. 30 tablet 3   nitroGLYCERIN (NITROLINGUAL) 0.4 MG/SPRAY spray Place 1 spray under the tongue every 5 (five) minutes as needed for chest pain.      pantoprazole (PROTONIX) 40 MG tablet Take 1 tablet (40 mg total) by mouth daily at 6 (six) AM. 30 tablet 1   pramipexole (MIRAPEX) 0.125 MG tablet Take 0.125 mg by mouth at bedtime.     rivaroxaban ([name] [name]) 10 MG TABS tablet Take 1 tablet (10 mg total) by mouth daily. 30 tablet 3   tamsulosin (FLOMAX) 0.4 MG CAPS capsule Take 0.8 mg by mouth at bedtime.   1   thiamine 100 MG tablet Take 1 tablet by mouth daily.     vitamin C (ASCORBIC ACID) 500 MG tablet Take 500 mg by mouth daily.     No current facility-administered medications for this visit.    Allergies  Allergen Reactions   Shellfish-Derived Products Anaphylaxis, Shortness Of Breath and Other (See Comments)    "My throat goes numb"     REVIEW OF SYSTEMS:  [X]  denotes positive finding, [ ]  denotes negative finding Cardiac  Comments:  Chest pain or chest pressure:    Shortness of breath upon exertion:    Short of breath when lying flat:    Irregular heart rhythm:        Vascular    Pain in calf, thigh, or hip brought on by ambulation:    Pain in feet at night that wakes you up from your sleep:     Blood clot in your veins:    Leg swelling:         Pulmonary    Oxygen at home:    Productive cough:     Wheezing:         Neurologic    Sudden weakness in arms or legs:     Sudden numbness in arms or legs:     Sudden onset of difficulty speaking or slurred speech:    Temporary loss of vision in one eye:     Problems with dizziness:         Gastrointestinal    Blood in stool:     Vomited blood:         Genitourinary    Burning when urinating:     Blood in urine:        Psychiatric    Major  depression:         Hematologic    Bleeding problems:    Problems with blood clotting too easily:        Skin    Rashes or ulcers:        Constitutional    Fever or chills:      PHYSICAL EXAMINATION:  There were no vitals filed for this visit.  General:  WDWN in NAD; vital signs documented above Gait: Not observed HENT: WNL, normocephalic Pulmonary: normal non-labored breathing , without wheezing Cardiac: regular HR, complaint of acute, intermittent episodes of chest pain which resolved in seconds Abdomen: soft, NT, no masses Skin: without rashes Vascular  Exam/Pulses:  Right Left  Radial 2+ (normal) 2+ (normal)  Ulnar    Femoral    Popliteal    DP 2+ (normal) 2+ (normal)  PT     Extremities: without ischemic changes, without Gangrene , without cellulitis; without open wounds;  Musculoskeletal: no muscle wasting or atrophy  Neurologic: A&O X 3;  No focal weakness or paresthesias are detected Psychiatric:  The pt has Normal affect.   Non-Invasive Vascular Imaging:    RIGHT CAROTID ARTERY: No significant calcifications of the right common carotid artery. Intermediate waveform maintained. Moderate heterogeneous plaque at the right carotid bifurcation. No significant lumen shadowing. Low resistance waveform of the right ICA. No significant tortuosity.   RIGHT VERTEBRAL ARTERY: Antegrade flow with low resistance waveform.   LEFT CAROTID ARTERY: Occlusion of the common carotid artery. Atherosclerotic plaque with calcified plaque at the carotid bulb. Waveform of the cervical ICA potentially within vaso vasorum.   LEFT VERTEBRAL ARTERY:  Antegrade flow with low resistance waveform.   IMPRESSION: Right:   Color duplex indicates minimal heterogeneous plaque, with no hemodynamically significant stenosis by duplex criteria in the extracranial cerebrovascular circulation.   Left:   Interval development of left common carotid and cervical ICA occlusion, indeterminate  chronicity.    ASSESSMENT/PLAN: NATHIN SARAN is a 71 y.o. male presenting with asymptomatic occlusion of the left common carotid artery with patent internal carotid artery.  Right carotid with minimal disease.  I had a long conversation with Chrissie Noa regarding the above.  Being that he is asymptomatic, there is no role in revascularization.  It is interesting that the ICA remains patent, and is likely fed through collaterals.  I have ordered a CT head and neck would help define the flow within the ICA and possible feeder vessels.  I asked him to continue his current medication regimen.  We discussed the signs and symptoms of stroke, TIA, amaurosis, advised him to call 911 immediately should these occur.  From a cardiac standpoint, I am very concerned about Kanishk's consistent need for nitroglycerin and episodes of acute chest pain with radiation into the left neck and left arm.  He stated he has tried to downplay these episodes in an effort to gain clearance for hip surgery.  In my office, he was complaining of chest pressure but stated it "wasn't that bad".  I urged him to go to the hospital, however he refused stating this was normal.  I urged him to return to his cardiologist to be re-evaluated. He stated he had two and wanted to go back to [place] - my office will reach out.    Victorino Sparrow, MD [facility] [phone]

## 2023-06-28 ENCOUNTER — Encounter: Payer: Self-pay | Admitting: Vascular Surgery

## 2023-06-28 ENCOUNTER — Ambulatory Visit: Payer: Medicare HMO | Admitting: Vascular Surgery

## 2023-06-28 VITALS — BP 136/79 | HR 77 | Temp 98.1°F | Ht 71.0 in | Wt 185.5 lb

## 2023-06-28 DIAGNOSIS — I6522 Occlusion and stenosis of left carotid artery: Secondary | ICD-10-CM

## 2023-06-29 ENCOUNTER — Other Ambulatory Visit: Payer: Self-pay

## 2023-06-29 DIAGNOSIS — I6522 Occlusion and stenosis of left carotid artery: Secondary | ICD-10-CM

## 2023-07-23 ENCOUNTER — Other Ambulatory Visit: Payer: Medicare HMO

## 2023-07-25 ENCOUNTER — Telehealth: Payer: Self-pay

## 2023-07-25 NOTE — Telephone Encounter (Signed)
   Patient Name: [name] [name]  DOB: Jan 28, 1951 MRN: [id number]  Primary Cardiologist: None  Chart reviewed as part of pre-operative protocol coverage.   Patient is currently not followed by [facility] he is followed by Dr. Sharyn Lull and clearance should come from primary cardiologist.   Napoleon Form, Leodis Rains, NP 07/25/2023, 4:22 PM

## 2023-07-25 NOTE — Telephone Encounter (Signed)
UPDATE ON CLEARANCE: CORRECT PHONE # IS [phone], FAX # [phone]  PER OP APP IS NEEDING TO CONFIRM IF NEEDING XARELTO TO BE HELD AS WELL. PT IS ON TRIPLE THERAPY FOR CARDIAC STENT.  I WAS ABLE TO CONFIRM YES WILL NEED TO HOLD XARELTO AS WELL.

## 2023-07-25 NOTE — Telephone Encounter (Signed)
Callback team please contact requesting provider's office to advise on holding Xarelto.  Patient is on triple therapy due to extensive clot and stent.  Thanks, Alden Server

## 2023-07-25 NOTE — Telephone Encounter (Signed)
..     Pre-operative Risk Assessment    Patient Name: [name] [name]  DOB: 1951/02/15 MRN: [id number]      Request for Surgical Clearance    Procedure:   RIGHT TOTAL HIP ARTHROPLASTY  Date of Surgery:  Clearance TBD                                 Surgeon: DR Milly Jakob MD  Surgeon's Group or Practice Name:  [facility]  Phone number:  [phone] Fax number:  Loreli Slot   Type of Clearance Requested:   - Medical  - Pharmacy:  Hold Aspirin and Clopidogrel (Plavix)     Type of Anesthesia:  Spinal   Additional requests/questions:   LAST O/V 06/28/23, NEXT APPT 08/02/23  Signed, Renee Ramus   07/25/2023, 1:33 PM

## 2023-07-27 NOTE — Addendum Note (Signed)
Addended by: Leilani Able, Cathyrn Deas A on: 07/27/2023 12:01 PM   Modules accepted: Orders

## 2023-07-30 ENCOUNTER — Other Ambulatory Visit: Payer: Medicare HMO

## 2023-07-30 ENCOUNTER — Ambulatory Visit
Admission: RE | Admit: 2023-07-30 | Discharge: 2023-07-30 | Disposition: A | Discharge status: Home / Self Care | Payer: Medicare HMO | Source: Ambulatory Visit | Attending: Vascular Surgery | Admitting: Vascular Surgery

## 2023-07-30 DIAGNOSIS — I6522 Occlusion and stenosis of left carotid artery: Secondary | ICD-10-CM

## 2023-07-30 MED ORDER — IOPAMIDOL (ISOVUE-370) INJECTION 76%
200.0000 mL | Freq: Once | INTRAVENOUS | Status: AC | PRN
  Administered 2023-07-30: 75 mL via INTRAVENOUS

## 2023-07-31 NOTE — Progress Notes (Signed)
Office Note     HPI: [name] [name] is a 72 y.o. (1951/09/13) male presenting in follow-up with known left common carotid artery occlusion.  Interestingly, he had reconstitution of the internal carotid artery, and therefore I elected to pursue imaging in an effort to understand the flow, and ensure that the common carotid was completely occluded without string sign.      In July of this year he presented to [facility] ED with concern for acute aortic syndrome.  CT angio chest abdomen pelvis followed demonstrating incidental finding of left-sided common carotid artery occlusion.  Initially, the carotid was widely patent and April.  On exam, [name] [name] was doing well.  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  [name] [name] has CAD with 6 coronary stents.  He states his main complaint is intermittent chest pain for which he takes nitroglycerin spray.  When probed further, [name] [name] notes episodes of chest pressure which occur daily with pain and numbness that radiate into the left neck and left arm.  All symptoms resolved with the use of nitroglycerin spray.  In some cases, [name] [name] states that he is using the spray up to 3 times daily.  [name] [name] denies classic symptoms of TIA, stroke, amaurosis.  He has had syncopal episodes in the past that he says occur when he stands up too quickly.  The last episode occurred 3 months ago.  Chart check revealed prior stenting with anterior wall STEMI ~ 6/21 with DES to prox/distal LAD (3 stents), EF > 70%. LHC in August 2021 which showed 20 to 30% in-stent restenosis in the proximal LAD, no intervention performed. Negative NMST in 2022. Recent admission at [facility] due to NSTEMI in the setting of cocaine use with LHC on 10/2022 showing patent vessels, no significant stenosis requiring stenting with mild neointimal hyperplasia concerning for presence of thrombus formation---started on Xarelto. "    Past Medical History:  Diagnosis Date    Arthritis    Cataracts, bilateral    Hyperlipidemia    Hypertension    Seasonal allergies    Sickle cell trait (HCC)     Past Surgical History:  Procedure Laterality Date   APPENDECTOMY     KNEE ARTHROSCOPY WITH ANTERIOR CRUCIATE LIGAMENT (ACL) REPAIR Left    LEFT HEART CATH AND CORONARY ANGIOGRAPHY N/A 05/11/2020   Procedure: LEFT HEART CATH AND CORONARY ANGIOGRAPHY;  Surgeon: Rinaldo Cloud, MD;  Location: [facility] INVASIVE CV LAB;  Service: Cardiovascular;  Laterality: N/A;   LEFT HEART CATH AND CORONARY ANGIOGRAPHY N/A 11/16/2022   Procedure: LEFT HEART CATH AND CORONARY ANGIOGRAPHY;  Surgeon: Yates Decamp, MD;  Location: [facility] INVASIVE CV LAB;  Service: Cardiovascular;  Laterality: N/A;  FIrst case, 7:30 AM   STENT PLACEMENT VASCULAR ([facility] HX)      Social History   Socioeconomic History   Marital status: Legally Separated    Spouse name: Not on file   Number of children: Not on file   Years of education: Not on file   Highest education level: Not on file  Occupational History   Occupation: RN at [employer]  Tobacco Use   Smoking status: Former   Smokeless tobacco: Former    Types: Chew    Quit date: 09/18/1970  Vaping Use   Vaping status: Never Used  Substance and Sexual Activity   Alcohol use: Yes    Comment: occas   Drug use: No   Sexual activity: Never  Other Topics Concern  Not on file  Social History Narrative   Social History      Diet? regular      Do you drink/eat things with caffeine?      Marital status?               separated                     What year were you married? 1982      Do you live in a house, apartment, assisted living, condo, trailer, etc.?  (house circled on new patient packet)      Is it one or more stories? yes      How many persons live in your home? 1      Do you have any pets in your home? (please list) no      Highest level of education completed?      Current or past profession: Technical brewer Yes POA, Living  Will      Do you exercise?         no                             Type & how often?      Do you have a living will? Yes      Do you have a DNR form?      Yes                            If not, do you want to discuss one?      Do you have signed POA/HPOA for forms? yes      Functional Status      Do you have difficulty bathing or dressing yourself? no      Do you have difficulty preparing food or eating?  no      Do you have difficulty managing your medications? no      Do you have difficulty managing your finances? no      Do you have difficulty affording your medications? no   Social Determinants of Health   Financial Resource Strain: Not on file  Food Insecurity: Not on file  Transportation Needs: Not on file  Physical Activity: Not on file  Stress: Not on file  Social Connections: Not on file  Intimate Partner Violence: Not on file   Family History  Problem Relation Age of Onset   Colon cancer Neg Hx    Esophageal cancer Neg Hx    Rectal cancer Neg Hx    Stomach cancer Neg Hx     Current Outpatient Medications  Medication Sig Dispense Refill   acetaminophen (TYLENOL) 325 MG tablet Take 2 tablets (650 mg total) by mouth every 4 (four) hours as needed for headache or mild pain. 60 tablet 1   albuterol (VENTOLIN HFA) 108 (90 Base) MCG/ACT inhaler Inhale 2 puffs into the lungs every 4 (four) hours as needed for wheezing or shortness of breath.      amLODipine (NORVASC) 10 MG tablet Take 1 tablet (10 mg total) by mouth daily. 90 tablet 0   aspirin 81 MG chewable tablet 1 tablet Orally Once a day for 30 day(s)     atorvastatin (LIPITOR) 80 MG tablet Take 1 tablet (80 mg total) by mouth daily. 30 tablet 3   clopidogrel (PLAVIX) 75 MG tablet Take 1 tablet (  75 mg total) by mouth daily. (Patient not taking: Reported on 06/28/2023) 30 tablet 3   finasteride (PROSCAR) 5 MG tablet Take 5 mg by mouth daily.     Iron-FA-B Cmp-C-Biot-Probiotic (FUSION PLUS) CAPS Take 1 capsule by  mouth daily.     isosorbide mononitrate (IMDUR) 30 MG 24 hr tablet Take 30 mg by mouth daily.     lidocaine (LIDODERM) 5 % Place 1 patch onto the skin daily as needed (pain).     Menthol, Topical Analgesic, (BIOFREEZE) 4 % GEL Apply 1 application topically 4 (four) times daily as needed (pain).      metoprolol succinate (TOPROL-XL) 25 MG 24 hr tablet Take 1 tablet (25 mg total) by mouth daily. 30 tablet 3   mirtazapine (REMERON) 7.5 MG tablet Take by mouth.     nitroGLYCERIN (NITROLINGUAL) 0.4 MG/SPRAY spray Place 1 spray under the tongue every 5 (five) minutes as needed for chest pain.      pantoprazole (PROTONIX) 40 MG tablet Take 1 tablet (40 mg total) by mouth daily at 6 (six) AM. 30 tablet 1   pramipexole (MIRAPEX) 0.125 MG tablet Take 0.125 mg by mouth at bedtime.     ranolazine (RANEXA) 500 MG 12 hr tablet Take 500 mg by mouth 2 (two) times daily.     rivaroxaban (XARELTO) 10 MG TABS tablet Take 1 tablet (10 mg total) by mouth daily. 30 tablet 3   tamsulosin (FLOMAX) 0.4 MG CAPS capsule Take 0.8 mg by mouth at bedtime.   1   thiamine 100 MG tablet Take 1 tablet by mouth daily.     vitamin C (ASCORBIC ACID) 500 MG tablet Take 500 mg by mouth daily. (Patient not taking: Reported on 06/28/2023)     No current facility-administered medications for this visit.    Allergies  Allergen Reactions   Shellfish-Derived Products Anaphylaxis, Shortness Of Breath and Other (See Comments)    "My throat goes numb"     REVIEW OF SYSTEMS:  [X]  denotes positive finding, [ ]  denotes negative finding Cardiac  Comments:  Chest pain or chest pressure:    Shortness of breath upon exertion:    Short of breath when lying flat:    Irregular heart rhythm:        Vascular    Pain in calf, thigh, or hip brought on by ambulation:    Pain in feet at night that wakes you up from your sleep:     Blood clot in your veins:    Leg swelling:         Pulmonary    Oxygen at home:    Productive cough:      Wheezing:         Neurologic    Sudden weakness in arms or legs:     Sudden numbness in arms or legs:     Sudden onset of difficulty speaking or slurred speech:    Temporary loss of vision in one eye:     Problems with dizziness:         Gastrointestinal    Blood in stool:     Vomited blood:         Genitourinary    Burning when urinating:     Blood in urine:        Psychiatric    Major depression:         Hematologic    Bleeding problems:    Problems with blood clotting too easily:  Skin    Rashes or ulcers:        Constitutional    Fever or chills:      PHYSICAL EXAMINATION:  There were no vitals filed for this visit.  General:  WDWN in NAD; vital signs documented above Gait: Not observed HENT: WNL, normocephalic Pulmonary: normal non-labored breathing , without wheezing Cardiac: regular HR, complaint of acute, intermittent episodes of chest pain which resolved in seconds Abdomen: soft, NT, no masses Skin: without rashes Vascular Exam/Pulses:  Right Left  Radial 2+ (normal) 2+ (normal)  Ulnar    Femoral    Popliteal    DP 2+ (normal) 2+ (normal)  PT     Extremities: without ischemic changes, without Gangrene , without cellulitis; without open wounds;  Musculoskeletal: no muscle wasting or atrophy  Neurologic: A&O X 3;  No focal weakness or paresthesias are detected Psychiatric:  The pt has Normal affect.   Non-Invasive Vascular Imaging:    RIGHT CAROTID ARTERY: No significant calcifications of the right common carotid artery. Intermediate waveform maintained. Moderate heterogeneous plaque at the right carotid bifurcation. No significant lumen shadowing. Low resistance waveform of the right ICA. No significant tortuosity.   RIGHT VERTEBRAL ARTERY: Antegrade flow with low resistance waveform.   LEFT CAROTID ARTERY: Occlusion of the common carotid artery. Atherosclerotic plaque with calcified plaque at the carotid bulb. Waveform of the  cervical ICA potentially within vaso vasorum.   LEFT VERTEBRAL ARTERY:  Antegrade flow with low resistance waveform.   IMPRESSION: Right:   Color duplex indicates minimal heterogeneous plaque, with no hemodynamically significant stenosis by duplex criteria in the extracranial cerebrovascular circulation.   Left:   Interval development of left common carotid and cervical ICA occlusion, indeterminate chronicity.    ASSESSMENT/PLAN: [name] [name] is a 72 y.o. male presenting with asymptomatic occlusion of the left common carotid artery with patent internal carotid artery.  Right carotid with minimal disease.  Interval CTA was reviewed demonstrating occlusion of the common carotid with likely reconstitution of the internal carotid from external carotid branches.  At this point, [name] [name] would be best served with continued medical management.  My plan is to follow him on a yearly basis.  We discussed the importance of aspirin, high intensity statin therapy.  We discussed the signs and symptoms of stroke, TIA, amaurosis, advised him to call 911 immediately should these occur.  From a cardiac standpoint, ***I am very concerned about [name] [name]'s consistent need for nitroglycerin and episodes of acute chest pain with radiation into the left neck and left arm.  He stated he has tried to downplay these episodes in an effort to gain clearance for hip surgery.  In my office, he was complaining of chest pressure but stated it "wasn't that bad".  I urged him to go to the hospital, however he refused stating this was normal.  I urged him to return to his cardiologist to be re-evaluated. He stated he had two and wanted to go back to [place] - my office will reach out.    [name] Sparrow, MD [facility] [phone]

## 2023-08-02 ENCOUNTER — Encounter: Payer: Medicare HMO | Admitting: Vascular Surgery

## 2023-08-02 ENCOUNTER — Telehealth: Payer: Self-pay | Admitting: Vascular Surgery

## 2023-08-02 NOTE — Telephone Encounter (Signed)
Spoke to patient regarding recent CT scan demonstrating occlusion of the left common carotid artery.  There is reconstitution which appears to be from external carotid branches feeding the internal carotid artery.  No concerning findings at this time.  My plan is to follow him on a yearly basis to ensure his right-sided carotid artery does not progress to needing intervention.  Currently without significant disease.  Victorino Sparrow MD

## 2023-08-29 ENCOUNTER — Other Ambulatory Visit: Payer: Self-pay | Admitting: Internal Medicine

## 2023-08-29 DIAGNOSIS — Z122 Encounter for screening for malignant neoplasm of respiratory organs: Secondary | ICD-10-CM

## 2023-08-30 ENCOUNTER — Encounter: Payer: Self-pay | Admitting: Internal Medicine

## 2023-09-17 ENCOUNTER — Ambulatory Visit
Admission: RE | Admit: 2023-09-17 | Discharge: 2023-09-17 | Disposition: A | Discharge status: Home / Self Care | Payer: Medicare HMO | Source: Ambulatory Visit | Attending: Internal Medicine | Admitting: Internal Medicine

## 2023-09-17 DIAGNOSIS — Z122 Encounter for screening for malignant neoplasm of respiratory organs: Secondary | ICD-10-CM

## 2023-11-06 ENCOUNTER — Ambulatory Visit
Admission: RE | Admit: 2023-11-06 | Discharge: 2023-11-06 | Disposition: A | Discharge status: Home / Self Care | Payer: Medicare HMO | Source: Ambulatory Visit | Attending: Internal Medicine | Admitting: Internal Medicine

## 2023-11-06 ENCOUNTER — Other Ambulatory Visit: Payer: Self-pay | Admitting: Internal Medicine

## 2023-11-06 DIAGNOSIS — Z1211 Encounter for screening for malignant neoplasm of colon: Secondary | ICD-10-CM

## 2023-11-15 LAB — COLOGUARD: COLOGUARD: POSITIVE — AB

## 2024-01-23 ENCOUNTER — Emergency Department (HOSPITAL_COMMUNITY)
Admission: EM | Admit: 2024-01-23 | Discharge: 2024-01-23 | Disposition: A | Discharge status: Home / Self Care | Attending: Emergency Medicine | Admitting: Emergency Medicine

## 2024-01-23 ENCOUNTER — Encounter (HOSPITAL_COMMUNITY): Payer: Self-pay | Admitting: Pharmacy Technician

## 2024-01-23 ENCOUNTER — Other Ambulatory Visit: Payer: Self-pay

## 2024-01-23 ENCOUNTER — Emergency Department (HOSPITAL_COMMUNITY)

## 2024-01-23 DIAGNOSIS — I252 Old myocardial infarction: Secondary | ICD-10-CM

## 2024-01-23 DIAGNOSIS — R079 Chest pain, unspecified: Secondary | ICD-10-CM

## 2024-01-23 DIAGNOSIS — I10 Essential (primary) hypertension: Secondary | ICD-10-CM | POA: Diagnosis not present

## 2024-01-23 DIAGNOSIS — Z7901 Long term (current) use of anticoagulants: Secondary | ICD-10-CM | POA: Insufficient documentation

## 2024-01-23 DIAGNOSIS — Z79899 Other long term (current) drug therapy: Secondary | ICD-10-CM | POA: Insufficient documentation

## 2024-01-23 DIAGNOSIS — Z7982 Long term (current) use of aspirin: Secondary | ICD-10-CM | POA: Insufficient documentation

## 2024-01-23 LAB — BASIC METABOLIC PANEL WITH GFR
Anion gap: 9 (ref 5–15)
BUN: 12 mg/dL (ref 8–23)
CO2: 22 mmol/L (ref 22–32)
Calcium: 8.9 mg/dL (ref 8.9–10.3)
Chloride: 106 mmol/L (ref 98–111)
Creatinine, Ser: 1.24 mg/dL (ref 0.61–1.24)
GFR, Estimated: >60 mL/min
Glucose, Bld: 104 mg/dL — ABNORMAL HIGH (ref 70–99)
Potassium: 4 mmol/L (ref 3.5–5.1)
Sodium: 137 mmol/L (ref 135–145)

## 2024-01-23 LAB — CBC
HCT: 38.9 % — ABNORMAL LOW (ref 39.0–52.0)
Hemoglobin: 12.4 g/dL — ABNORMAL LOW (ref 13.0–17.0)
MCH: 27.3 pg (ref 26.0–34.0)
MCHC: 31.9 g/dL (ref 30.0–36.0)
MCV: 85.5 fL (ref 80.0–100.0)
Platelets: 203 10*3/uL (ref 150–400)
RBC: 4.55 MIL/uL (ref 4.22–5.81)
RDW: 13.5 % (ref 11.5–15.5)
WBC: 7 10*3/uL (ref 4.0–10.5)
nRBC: 0 % (ref 0.0–0.2)

## 2024-01-23 LAB — TROPONIN I (HIGH SENSITIVITY)
Troponin I (High Sensitivity): 20 ng/L — ABNORMAL HIGH
Troponin I (High Sensitivity): 20 ng/L — ABNORMAL HIGH (ref <18)

## 2024-01-23 MED ORDER — NITROGLYCERIN 0.4 MG/SPRAY TL SOLN
1.0000 | Freq: Once | Status: AC
  Administered 2024-01-23: 1 via SUBLINGUAL
  Filled 2024-01-23: qty 4.9

## 2024-01-23 NOTE — ED Triage Notes (Signed)
 Pt bib ems with chest pain for the last 3 weeks. Regularly takes nitroglycerin  for the chest pain. Went to PCP today and was sent here for eval. PCP gave 2 nitroglycerin , pain resolved. Given 324mg  asa. VSS with ems.

## 2024-01-23 NOTE — ED Provider Notes (Signed)
 [place] EMERGENCY DEPARTMENT AT [facility] Provider Note   CSN: [id number] Arrival date & time: 01/23/24  1738     History  No chief complaint on file.   [name] [name] is a 72 y.o. male.  With a history of hypertension, hyperlipidemia and NSTEMI presents to the ED for chest pain.  Patient has experienced episodic chest pain over the last 3 weeks.  He uses sublingual nitro spray frequently at home which is the only thing it helps with the pain.  Some associated shortness of breath.  No increased O2 requirement at home.  No nausea vomiting diaphoresis fever chills or recent illness.  He is followed by Dr.Harwani with cardiology  HPI     Home Medications Prior to Admission medications   Medication Sig Start Date End Date Taking? Authorizing Provider  acetaminophen  (TYLENOL ) 325 MG tablet Take 2 tablets (650 mg total) by mouth every 4 (four) hours as needed for headache or mild pain. 05/11/20  Yes Chapman Commodore, MD  albuterol  (VENTOLIN  HFA) 108 (90 Base) MCG/ACT inhaler Inhale 2 puffs into the lungs every 4 (four) hours as needed for wheezing or shortness of breath.  07/02/19  Yes [provider]  amLODipine  (NORVASC ) 10 MG tablet Take 1 tablet (10 mg total) by mouth daily. 11/16/22  Yes Knox Perl, MD  aspirin  81 MG chewable tablet Chew 81 mg by mouth daily.   Yes [provider]  atorvastatin  (LIPITOR ) 20 MG tablet Take 20 mg by mouth daily.   Yes [provider]  diclofenac  Sodium (VOLTAREN  ARTHRITIS PAIN) 1 % GEL Apply 2 g topically daily as needed (for pain).   Yes [provider]  finasteride (PROSCAR) 5 MG tablet Take 5 mg by mouth daily. 02/09/21  Yes [provider]  furosemide (LASIX) 20 MG tablet Take 20 mg by mouth daily.   Yes [provider]  Iron -FA-B Cmp-C-Biot-Probiotic (FUSION PLUS) CAPS Take 1 capsule by mouth daily. 03/17/21  Yes [provider]  isosorbide  mononitrate (IMDUR ) 30 MG 24 hr tablet  Take 30 mg by mouth daily. 04/06/20  Yes [provider]  losartan (COZAAR) 50 MG tablet Take 50 mg by mouth daily. 08/18/23  Yes [provider]  Menthol, Topical Analgesic, (BIOFREEZE) 4 % GEL Apply 1 application topically 4 (four) times daily as needed (pain).    Yes [provider]  metoprolol  succinate (TOPROL -XL) 50 MG 24 hr tablet Take 50 mg by mouth daily. Take with or immediately following a meal.   Yes [provider]  mirtazapine (REMERON) 7.5 MG tablet Take 7.5 mg by mouth at bedtime.   Yes [provider]  nitroGLYCERIN  (NITROLINGUAL ) 0.4 MG/SPRAY spray Place 1 spray under the tongue every 5 (five) minutes as needed for chest pain.    Yes [provider]  pantoprazole  (PROTONIX ) 40 MG tablet Take 1 tablet (40 mg total) by mouth daily at 6 (six) AM. 03/28/21  Yes Chapman Commodore, MD  pramipexole  (MIRAPEX ) 0.125 MG tablet Take 0.125 mg by mouth at bedtime. 03/29/20  Yes [provider]  ranolazine  (RANEXA ) 500 MG 12 hr tablet Take 500 mg by mouth 2 (two) times daily.   Yes [provider]  rivaroxaban  (XARELTO ) 10 MG TABS tablet Take 1 tablet (10 mg total) by mouth daily. 11/16/22  Yes Knox Perl, MD  tamsulosin  (FLOMAX ) 0.4 MG CAPS capsule Take 0.8 mg by mouth at bedtime.  05/23/18  Yes [provider]  thiamine 100 MG tablet Take  1 tablet by mouth daily.   Yes [provider]  atorvastatin  (LIPITOR ) 80 MG tablet Take 1 tablet (80 mg total) by mouth daily. Patient not taking: Reported on 01/23/2024 05/12/20   Chapman Commodore, MD  clopidogrel  (PLAVIX ) 75 MG tablet Take 1 tablet (75 mg total) by mouth daily. Patient not taking: Reported on 06/28/2023 11/16/22   Knox Perl, MD  metoprolol  succinate (TOPROL -XL) 25 MG 24 hr tablet Take 1 tablet (25 mg total) by mouth daily. Patient not taking: Reported on 01/23/2024 03/02/17   Laird Pih, DO      Allergies    Shellfish-derived products    Review of Systems    Review of Systems  Physical Exam Updated Vital Signs BP 125/75   Pulse 63   Temp 98.5 F (36.9 C) (Oral)   Resp 13   SpO2 96%  Physical Exam Vitals and nursing note reviewed.  HENT:     Head: Normocephalic and atraumatic.  Eyes:     Pupils: Pupils are equal, round, and reactive to light.  Cardiovascular:     Rate and Rhythm: Normal rate and regular rhythm.  Pulmonary:     Effort: Pulmonary effort is normal.     Breath sounds: Normal breath sounds.  Abdominal:     Palpations: Abdomen is soft.     Tenderness: There is no abdominal tenderness.  Skin:    General: Skin is warm and dry.  Neurological:     Mental Status: He is alert.  Psychiatric:        Mood and Affect: Mood normal.     ED Results / Procedures / Treatments   Labs (all labs ordered are listed, but only abnormal results are displayed) Labs Reviewed  BASIC METABOLIC PANEL WITH GFR - Abnormal; Notable for the following components:      Result Value   Glucose, Bld 104 (*)    All other components within normal limits  CBC - Abnormal; Notable for the following components:   Hemoglobin 12.4 (*)    HCT 38.9 (*)    All other components within normal limits  TROPONIN I (HIGH SENSITIVITY) - Abnormal; Notable for the following components:   Troponin I (High Sensitivity) 20 (*)    All other components within normal limits  TROPONIN I (HIGH SENSITIVITY) - Abnormal; Notable for the following components:   Troponin I (High Sensitivity) 20 (*)    All other components within normal limits    EKG EKG Interpretation Date/Time:  Wednesday Jan 23 2024 17:43:52 EDT Ventricular Rate:  67 PR Interval:  146 QRS Duration:  90 QT Interval:  412 QTC Calculation: 435 R Axis:   22  Text Interpretation: Normal sinus rhythm ST elevation, consider early repolarization similar to ecg of 11-16-2022 Borderline ECG When compared with ECG of 16-Nov-2022 10:52, PREVIOUS ECG IS PRESENT Confirmed by Rafael Bun [phone]) on 01/23/2024  6:42:46 PM  Radiology DG Chest 2 View Result Date: 01/23/2024 CLINICAL DATA:  Chest pain. EXAM: CHEST - 2 VIEW COMPARISON:  11/06/2023. FINDINGS: Stable cardiomediastinal silhouette. No focal consolidation, pleural effusion, or pneumothorax. No acute osseous abnormality. IMPRESSION: No acute cardiopulmonary findings. Electronically Signed   By: Mannie Seek M.D.   On: 01/23/2024 19:04    Procedures Procedures    Medications Ordered in ED Medications  nitroGLYCERIN  (NITROLINGUAL ) 0.4 MG/SPRAY spray 1 spray (1 spray Sublingual Given 01/23/24 2054)    ED Course/ Medical Decision Making/ A&P Clinical Course as of 01/23/24 2230  Wed Jan 23, 2024  2229 Initial  troponin 20 with delta troponin 20.  No active chest pain currently.  EKG without ischemic changes or dysrhythmia.  The rest of his labs look okay.  Patient will follow-up with his cardiologist and understands return precautions to come back for [MP]    Clinical Course User Index [MP] Sallyanne Creamer, DO                                 Medical Decision Making 73 year old male with history as above presenting for episodic chest pain over the last 3 weeks.  Relieved with nitro.  No other systemic complaints at this time.  Afebrile normotensive in normal sinus rhythm now.  Will evaluate for ACS, dysrhythmia, pneumonia, pulmonary edema continue to monitor on telemetry  Amount and/or Complexity of Data Reviewed Labs: ordered. Radiology: ordered.  Risk Prescription drug management.           Final Clinical Impression(s) / ED Diagnoses Final diagnoses:  Chest pain, unspecified type  History of myocardial infarction    Rx / DC Orders ED Discharge Orders     None         Sallyanne Creamer, DO 01/23/24 2230

## 2024-01-23 NOTE — Discharge Instructions (Signed)
 You were seen in the emergency room for chest pain Your blood work EKG and chest x-ray looked okay It is important that you follow-up with your cardiologist within 1 week for reevaluation Return to the emergency department for severe chest pain trouble breathing or other concerns

## 2024-04-15 NOTE — Procedures (Signed)
 [name]

## 2024-04-30 ENCOUNTER — Other Ambulatory Visit: Payer: Self-pay | Admitting: Orthopedic Surgery

## 2024-05-06 NOTE — Progress Notes (Signed)
  Date of COVID positive in last 90 days:  PCP - Ezekiel Charleston, MD Cardiologist - Charon Bracket, NP/Steven Rohrbeck  Chest x-ray - 01-23-24 Epic EKG - 01-23-24 Epic Stress Test - 02-19-24 [facility] ECHO - 02-28-24 [facility] Cardiac Cath - 03-26-23 [facility] Pacemaker/ICD device last checked:N/A Spinal Cord Stimulator:N/A  Bowel Prep - N/A  Sleep Study - N/A CPAP -   Fasting Blood Sugar - N/A Checks Blood Sugar _____ times a day  Last dose of GLP1 agonist-  N/A GLP1 instructions:  Do not take after     Last dose of SGLT-2 inhibitors-  N/A SGLT-2 instructions:  Do not take after     Blood Thinner Instructions: Xarelto  Last dose:   Time: Aspirin  Instructions: ASA 81 Last Dose:  Activity level:  Can go up a flight of stairs and perform activities of daily living without stopping and without symptoms of chest pain or shortness of breath.  Able to exercise without symptoms  Unable to go up a flight of stairs without symptoms of     Anesthesia review: Acute coronary syndrome, hx of NSTEMI, HTN, CAD, sickle cell trait  Patient denies shortness of breath, fever, cough and chest pain at PAT appointment  Patient verbalized understanding of instructions that were given to them at the PAT appointment. Patient was also instructed that they will need to review over the PAT instructions again at home before surgery.

## 2024-05-06 NOTE — Patient Instructions (Addendum)
 SURGICAL WAITING ROOM VISITATION Patients having surgery or a procedure may have no more than 2 support people in the waiting area - these visitors may rotate.    Children under the age of 6 must have an adult with them who is not the patient.  If the patient needs to stay at the hospital during part of their recovery, the visitor guidelines for inpatient rooms apply. Pre-op nurse will coordinate an appropriate time for 1 support person to accompany patient in pre-op.  This support person may not rotate.    Please refer to the [facility] website for the visitor guidelines for Inpatients (after your surgery is over and you are in a regular room).       Your procedure is scheduled on: 05-12-24   Report to [facility] Main Entrance    Report to admitting at 9:30AM   Call this number if you have problems the morning of surgery [phone]   Do not eat food :After Midnight.   After Midnight you may have the following liquids until 9:00 AM DAY OF SURGERY  Water Non-Citrus Juices (without pulp, NO RED-Apple, White grape, White cranberry) Black Coffee (NO MILK/CREAM OR CREAMERS, sugar ok)  Clear Tea (NO MILK/CREAM OR CREAMERS, sugar ok) regular and decaf                             Plain Jell-O (NO RED)                                           Fruit ices (not with fruit pulp, NO RED)                                     Popsicles (NO RED)                                                               Sports drinks like Gatorade (NO RED)                   The day of surgery:  Drink ONE (1) Pre-Surgery Clear Ensure by 9:00 AM the morning of surgery. Drink in one sitting. Do not sip.  This drink was given to you during your hospital  pre-op appointment visit. Nothing else to drink after completing the Pre-Surgery Clear Ensure.          If you have questions, please contact your surgeon's office.   FOLLOW  ANY ADDITIONAL PRE OP INSTRUCTIONS YOU RECEIVED FROM YOUR SURGEON'S  OFFICE!!!     Oral Hygiene is also important to reduce your risk of infection.                                    Remember - BRUSH YOUR TEETH THE MORNING OF SURGERY WITH YOUR REGULAR TOOTHPASTE   Do NOT smoke after Midnight   Take these medicines the morning of surgery with A SIP OF WATER:    Amlodipine    Atorvastatin   Finasteride   Isosorbide    Metoprolol    Pantoprazole    Ranolazine    Okay to use inhalers   Tylenol  if needed  Stop all vitamins and herbal supplements 7 days before surgery  Xarelto  - hold   Bring CPAP mask and tubing day of surgery.                              You may not have any metal on your body including  jewelry, and body piercing             Do not wear  lotions, powders,  cologne, or deodorant              Men may shave face and neck.   Do not bring valuables to the hospital. [facility] IS NOT RESPONSIBLE   FOR VALUABLES.   Contacts, dentures or bridgework may not be worn into surgery.   Bring small overnight bag day of surgery.   DO NOT BRING YOUR HOME MEDICATIONS TO THE HOSPITAL. PHARMACY WILL DISPENSE MEDICATIONS LISTED ON YOUR MEDICATION LIST TO YOU DURING YOUR ADMISSION IN THE HOSPITAL!    Special Instructions: Bring a copy of your healthcare power of attorney and living will documents the day of surgery if you haven't scanned them before.              Please read over the following fact sheets you were given: IF YOU HAVE QUESTIONS ABOUT YOUR PRE-OP INSTRUCTIONS PLEASE CALL [phone] Gwen  If you received a COVID test during your pre-op visit  it is requested that you wear a mask when out in public, stay away from anyone that may not be feeling well and notify your surgeon if you develop symptoms. If you test positive for Covid or have been in contact with anyone that has tested positive in the last 10 days please notify you surgeon.   Pre-operative 5 CHG Bath Instructions   You can play a key role in reducing the risk of infection  after surgery. Your skin needs to be as free of germs as possible. You can reduce the number of germs on your skin by washing with CHG (chlorhexidine gluconate) soap before surgery. CHG is an antiseptic soap that kills germs and continues to kill germs even after washing.   DO NOT use if you have an allergy to chlorhexidine/CHG or antibacterial soaps. If your skin becomes reddened or irritated, stop using the CHG and notify one of our RNs at [phone].   Please shower with the CHG soap starting 4 days before surgery using the following schedule:     Please keep in mind the following:  DO NOT shave, including legs and underarms, starting the day of your first shower.   You may shave your face at any point before/day of surgery.  Place clean sheets on your bed the day you start using CHG soap. Use a clean washcloth (not used since being washed) for each shower. DO NOT sleep with pets once you start using the CHG.   CHG Shower Instructions:  If you choose to wash your hair and private area, wash first with your normal shampoo/soap.  After you use shampoo/soap, rinse your hair and body thoroughly to remove shampoo/soap residue.  Turn the water OFF and apply about 3 tablespoons (45 ml) of CHG soap to a CLEAN washcloth.  Apply CHG soap ONLY FROM YOUR NECK DOWN TO YOUR TOES (washing for 3-5 minutes)  DO NOT use CHG soap on face, private areas, open wounds, or sores.  Pay special attention to the area where your surgery is being performed.  If you are having back surgery, having someone wash your back for you may be helpful. Wait 2 minutes after CHG soap is applied, then you may rinse off the CHG soap.  Pat dry with a clean towel  Put on clean clothes/pajamas   If you choose to wear lotion, please use ONLY the CHG-compatible lotions on the back of this paper.     Additional instructions for the day of surgery: DO NOT APPLY any lotions, deodorants, cologne, or perfumes.   Put on  clean/comfortable clothes.  Brush your teeth.  Ask your nurse before applying any prescription medications to the skin.      CHG Compatible Lotions   Aveeno Moisturizing lotion  Cetaphil Moisturizing Cream  Cetaphil Moisturizing Lotion  Clairol Herbal Essence Moisturizing Lotion, Dry Skin  Clairol Herbal Essence Moisturizing Lotion, Extra Dry Skin  Clairol Herbal Essence Moisturizing Lotion, Normal Skin  Curel Age Defying Therapeutic Moisturizing Lotion with Alpha Hydroxy  Curel Extreme Care Body Lotion  Curel Soothing Hands Moisturizing Hand Lotion  Curel Therapeutic Moisturizing Cream, Fragrance-Free  Curel Therapeutic Moisturizing Lotion, Fragrance-Free  Curel Therapeutic Moisturizing Lotion, Original Formula  Eucerin Daily Replenishing Lotion  Eucerin Dry Skin Therapy Plus Alpha Hydroxy Crme  Eucerin Dry Skin Therapy Plus Alpha Hydroxy Lotion  Eucerin Original Crme  Eucerin Original Lotion  Eucerin Plus Crme Eucerin Plus Lotion  Eucerin TriLipid Replenishing Lotion  Keri Anti-Bacterial Hand Lotion  Keri Deep Conditioning Original Lotion Dry Skin Formula Softly Scented  Keri Deep Conditioning Original Lotion, Fragrance Free Sensitive Skin Formula  Keri Lotion Fast Absorbing Fragrance Free Sensitive Skin Formula  Keri Lotion Fast Absorbing Softly Scented Dry Skin Formula  Keri Original Lotion  Keri Skin Renewal Lotion Keri Silky Smooth Lotion  Keri Silky Smooth Sensitive Skin Lotion  Nivea Body Creamy Conditioning Oil  Nivea Body Extra Enriched Lotion  Nivea Body Original Lotion  Nivea Body Sheer Moisturizing Lotion Nivea Crme  Nivea Skin Firming Lotion  NutraDerm 30 Skin Lotion  NutraDerm Skin Lotion  NutraDerm Therapeutic Skin Cream  NutraDerm Therapeutic Skin Lotion  ProShield Protective Hand Cream  Provon moisturizing lotion   PATIENT SIGNATURE_________________________________  NURSE  SIGNATURE__________________________________  ________________________________________________________________________    [name] [name]  An incentive spirometer is a tool that can help keep your lungs clear and active. This tool measures how well you are filling your lungs with each breath. Taking long deep breaths may help reverse or decrease the chance of developing breathing (pulmonary) problems (especially infection) following: A long period of time when you are unable to move or be active. BEFORE THE PROCEDURE  If the spirometer includes an indicator to show your best effort, your nurse or respiratory therapist will set it to a desired goal. If possible, sit up straight or lean slightly forward. Try not to slouch. Hold the incentive spirometer in an upright position. INSTRUCTIONS FOR USE  Sit on the edge of your bed if possible, or sit up as far as you can in bed or on a chair. Hold the incentive spirometer in an upright position. Breathe out normally. Place the mouthpiece in your mouth and seal your lips tightly around it. Breathe in slowly and as deeply as possible, raising the piston or the ball toward the top of the column. Hold your breath for 3-5 seconds or for as long as possible. Allow the  piston or ball to fall to the bottom of the column. Remove the mouthpiece from your mouth and breathe out normally. Rest for a few seconds and repeat Steps 1 through 7 at least 10 times every 1-2 hours when you are awake. Take your time and take a few normal breaths between deep breaths. The spirometer may include an indicator to show your best effort. Use the indicator as a goal to work toward during each repetition. After each set of 10 deep breaths, practice coughing to be sure your lungs are clear. If you have an incision (the cut made at the time of surgery), support your incision when coughing by placing a pillow or rolled up towels firmly against it. Once you are able to get out of  bed, walk around indoors and cough well. You may stop using the incentive spirometer when instructed by your caregiver.  RISKS AND COMPLICATIONS Take your time so you do not get dizzy or light-headed. If you are in pain, you may need to take or ask for pain medication before doing incentive spirometry. It is harder to take a deep breath if you are having pain. AFTER USE Rest and breathe slowly and easily. It can be helpful to keep track of a log of your progress. Your caregiver can provide you with a simple table to help with this. If you are using the spirometer at home, follow these instructions: SEEK MEDICAL CARE IF:  You are having difficultly using the spirometer. You have trouble using the spirometer as often as instructed. Your pain medication is not giving enough relief while using the spirometer. You develop fever of 100.5 F (38.1 C) or higher. SEEK IMMEDIATE MEDICAL CARE IF:  You cough up bloody sputum that had not been present before. You develop fever of 102 F (38.9 C) or greater. You develop worsening pain at or near the incision site. MAKE SURE YOU:  Understand these instructions. Will watch your condition. Will get help right away if you are not doing well or get worse. Document Released: 01/15/2007 Document Revised: 11/27/2011 Document Reviewed: 03/18/2007 ExitCare Patient Information 2014 [employer], [place].   ________________________________________________________________________ WHAT IS A BLOOD TRANSFUSION? Blood Transfusion Information  A transfusion is the replacement of blood or some of its parts. Blood is made up of multiple cells which provide different functions. Red blood cells carry oxygen and are used for blood loss replacement. White blood cells fight against infection. Platelets control bleeding. Plasma helps clot blood. Other blood products are available for specialized needs, such as hemophilia or other clotting disorders. BEFORE THE TRANSFUSION   Who gives blood for transfusions?  Healthy volunteers who are fully evaluated to make sure their blood is safe. This is blood bank blood. Transfusion therapy is the safest it has ever been in the practice of medicine. Before blood is taken from a donor, a complete history is taken to make sure that person has no history of diseases nor engages in risky social behavior (examples are intravenous drug use or sexual activity with multiple partners). The donor's travel history is screened to minimize risk of transmitting infections, such as malaria. The donated blood is tested for signs of infectious diseases, such as HIV and hepatitis. The blood is then tested to be sure it is compatible with you in order to minimize the chance of a transfusion reaction. If you or a relative donates blood, this is often done in anticipation of surgery and is not appropriate for emergency situations. It takes many days  to process the donated blood. RISKS AND COMPLICATIONS Although transfusion therapy is very safe and saves many lives, the main dangers of transfusion include:  Getting an infectious disease. Developing a transfusion reaction. This is an allergic reaction to something in the blood you were given. Every precaution is taken to prevent this. The decision to have a blood transfusion has been considered carefully by your caregiver before blood is given. Blood is not given unless the benefits outweigh the risks. AFTER THE TRANSFUSION Right after receiving a blood transfusion, you will usually feel much better and more energetic. This is especially true if your red blood cells have gotten low (anemic). The transfusion raises the level of the red blood cells which carry oxygen, and this usually causes an energy increase. The nurse administering the transfusion will monitor you carefully for complications. HOME CARE INSTRUCTIONS  No special instructions are needed after a transfusion. You may find your energy is  better. Speak with your caregiver about any limitations on activity for underlying diseases you may have. SEEK MEDICAL CARE IF:  Your condition is not improving after your transfusion. You develop redness or irritation at the intravenous (IV) site. SEEK IMMEDIATE MEDICAL CARE IF:  Any of the following symptoms occur over the next 12 hours: Shaking chills. You have a temperature by mouth above 102 F (38.9 C), not controlled by medicine. Chest, back, or muscle pain. People around you feel you are not acting correctly or are confused. Shortness of breath or difficulty breathing. Dizziness and fainting. You get a rash or develop hives. You have a decrease in urine output. Your urine turns a dark color or changes to pink, red, or brown. Any of the following symptoms occur over the next 10 days: You have a temperature by mouth above 102 F (38.9 C), not controlled by medicine. Shortness of breath. Weakness after normal activity. The white part of the eye turns yellow (jaundice). You have a decrease in the amount of urine or are urinating less often. Your urine turns a dark color or changes to pink, red, or brown. Document Released: 09/01/2000 Document Revised: 11/27/2011 Document Reviewed: 04/20/2008 [facility] Patient Information 2014 [place], [place].  _______________________________________________________________________

## 2024-05-08 ENCOUNTER — Encounter (HOSPITAL_COMMUNITY)
Admission: RE | Admit: 2024-05-08 | Discharge: 2024-05-08 | Disposition: A | Discharge status: Home / Self Care | Source: Ambulatory Visit | Attending: Orthopedic Surgery | Admitting: Orthopedic Surgery

## 2024-05-08 ENCOUNTER — Other Ambulatory Visit: Payer: Self-pay

## 2024-05-08 ENCOUNTER — Encounter (HOSPITAL_COMMUNITY): Payer: Self-pay

## 2024-05-08 VITALS — BP 121/74 | HR 65 | Temp 98.2°F | Resp 16 | Ht 71.0 in | Wt 184.6 lb

## 2024-05-08 DIAGNOSIS — Z87891 Personal history of nicotine dependence: Secondary | ICD-10-CM | POA: Insufficient documentation

## 2024-05-08 DIAGNOSIS — Z01818 Encounter for other preprocedural examination: Secondary | ICD-10-CM | POA: Insufficient documentation

## 2024-05-08 DIAGNOSIS — I10 Essential (primary) hypertension: Secondary | ICD-10-CM | POA: Diagnosis not present

## 2024-05-08 DIAGNOSIS — I2510 Atherosclerotic heart disease of native coronary artery without angina pectoris: Secondary | ICD-10-CM | POA: Diagnosis not present

## 2024-05-08 DIAGNOSIS — Z955 Presence of coronary angioplasty implant and graft: Secondary | ICD-10-CM | POA: Diagnosis not present

## 2024-05-08 DIAGNOSIS — M1611 Unilateral primary osteoarthritis, right hip: Secondary | ICD-10-CM | POA: Diagnosis not present

## 2024-05-08 HISTORY — DX: Acute myocardial infarction, unspecified: I21.9

## 2024-05-08 HISTORY — DX: Anemia, unspecified: D64.9

## 2024-05-08 HISTORY — DX: Atherosclerotic heart disease of native coronary artery without angina pectoris: I25.10

## 2024-05-08 LAB — CBC
HCT: 37.8 % — ABNORMAL LOW (ref 39.0–52.0)
Hemoglobin: 11.8 g/dL — ABNORMAL LOW (ref 13.0–17.0)
MCH: 27.3 pg (ref 26.0–34.0)
MCHC: 31.2 g/dL (ref 30.0–36.0)
MCV: 87.5 fL (ref 80.0–100.0)
Platelets: 290 K/uL (ref 150–400)
RBC: 4.32 MIL/uL (ref 4.22–5.81)
RDW: 13.3 % (ref 11.5–15.5)
WBC: 6.7 K/uL (ref 4.0–10.5)
nRBC: 0 % (ref 0.0–0.2)

## 2024-05-08 LAB — SURGICAL PCR SCREEN
MRSA, PCR: NEGATIVE
Staphylococcus aureus: POSITIVE — AB

## 2024-05-08 LAB — BASIC METABOLIC PANEL WITH GFR
BUN: 10 mg/dL (ref 8–23)
CO2: 24 mmol/L (ref 22–32)
Calcium: 9.1 mg/dL (ref 8.9–10.3)
Chloride: 107 mmol/L (ref 98–111)
Creatinine, Ser: 1.01 mg/dL (ref 0.61–1.24)
GFR, Estimated: >60 mL/min (ref >60)
Glucose, Bld: 115 mg/dL — ABNORMAL HIGH (ref 70–99)
Potassium: 3.5 mmol/L (ref 3.5–5.1)
Sodium: 141 mmol/L (ref 135–145)

## 2024-05-09 NOTE — Progress Notes (Signed)
 Anesthesia Chart Review   Case: [id number] Date/Time: 05/12/24 1015   Procedure: ARTHROPLASTY, HIP, TOTAL, ANTERIOR APPROACH (Right: Hip)   Anesthesia type: Spinal   Pre-op diagnosis: RIGHT HIP SEVERE DEGENERATIVE JOINT DISEASE   Location: [facility] ROOM 08 / [facility]   Surgeons: Yvone Rush, MD       DISCUSSION:72 y.o. former smoker with h/o HTN, CAD s/p DES to LAD 2021, right hip djd scheduled for above procedure 05/12/24 with Dr. Rush Yvone.   Pt last seen by cardiology 02/01/2024. Pt experiencing chest discomfort at this visit. Stress test ordered. Cardiac cath 03/2023 with patent stent, nonobstructive CAD. Low risk stress test 02/19/2024.   Pt cleared to proceed with surgery.   Pt reports last dose of Xarelto  05/05/2024.  VS: BP 121/74   Pulse 65   Temp 36.8 C (Oral)   Resp 16   Ht 5' 11 (1.803 m)   Wt 83.7 kg   SpO2 97%   BMI 25.75 kg/m   PROVIDERS: Roanna Ezekiel NOVAK, MD is PCP   Elspeth Kitten, MD is Cardiologist  LABS: Labs reviewed: Acceptable for surgery. (all labs ordered are listed, but only abnormal results are displayed)  Labs Reviewed  SURGICAL PCR SCREEN - Abnormal; Notable for the following components:      Result Value   Staphylococcus aureus POSITIVE (*)    All other components within normal limits  BASIC METABOLIC PANEL WITH GFR - Abnormal; Notable for the following components:   Glucose, Bld 115 (*)    All other components within normal limits  CBC - Abnormal; Notable for the following components:   Hemoglobin 11.8 (*)    HCT 37.8 (*)    All other components within normal limits  TYPE AND SCREEN     IMAGES:   EKG:   CV: Echo 02/28/24 (Care Everywhere) Image Quality  Technically adequate. A two-dimensional transthoracic echocardiogram with color flow  and Doppler was performed.  Normal LV size, wall thickness, wall motion and systolic function with ejection fraction 60-65%  There is mild mitral regurgitation.  There is mild tricuspid  regurgitation.  No pulmonary hypertension.  There is no pericardial effusion.  IVC size was normal.  The ascending aorta is normal size.  There is no significant change in comparison with the last study.   Myocardial Perfusion 02/19/2024 (Care Everywhere) This result has an attachment that is not available.    ECG test result was negative for evidence of ischemia. There was no  chest pain during the exam.    There is an attenuation artifact in the inferior wall, there is no  evidence of infarct or ischemia.    The SPECT images demonstrate a fixed, small sized perfusion abnormality  of mild intensity located in the mid to basal inferior wall.    Overall left ventricular systolic function was normal. The calculated  ejection fraction was measured at 57%.    Low cardiovascular risk   Cardiac Cath 03/26/2023 (Care Everywhere) This result has an attachment that is not available.    Non-obstructive CAD   Cardiac Cath 11/16/2022 Impression: Widely patent LAD stent placed in 2021, proximal and mid to distal segment of the LAD has a stent within the stent.  There is mild neointimal hyperplasia.  Suspect thrombus formation in 1 of these areas which is resolved with heparin  and Aggrastat .   Recommendation: Aspirin  for 30 days, Plavix  for 1 year or indefinitely, will add Xarelto  10 mg daily to prevent thrombus formation.   Amlodipine  increased to  10 mg daily for hypertension and also angina pectoris, discontinued Ranexa  and also Jardiance.  Echo 11/16/2022 1. Left ventricular ejection fraction, by estimation, is 60 to 65%. The  left ventricle has normal function. The left ventricle has no regional  wall motion abnormalities. There is moderate concentric left ventricular  hypertrophy. Left ventricular  diastolic parameters are consistent with Grade I diastolic dysfunction  (impaired relaxation).   2. Right ventricular systolic function is normal. The right ventricular  size is normal. There is  normal pulmonary artery systolic pressure.   3. Right atrial size was moderately dilated.   4. The mitral valve is normal in structure. Trivial mitral valve  regurgitation. No evidence of mitral stenosis.   5. The aortic valve is normal in structure. Aortic valve regurgitation is  not visualized. No aortic stenosis is present.   6. The inferior vena cava is normal in size with greater than 50%  respiratory variability, suggesting right atrial pressure of 3 mmHg.   Past Medical History:  Diagnosis Date   Anemia    Arthritis    Cataracts, bilateral    Coronary artery disease    Hyperlipidemia    Hypertension    Myocardial infarction (HCC)    Seasonal allergies    Sickle cell trait (HCC)     Past Surgical History:  Procedure Laterality Date   APPENDECTOMY     KNEE ARTHROSCOPY WITH ANTERIOR CRUCIATE LIGAMENT (ACL) REPAIR Left    LEFT HEART CATH AND CORONARY ANGIOGRAPHY N/A 05/11/2020   Procedure: LEFT HEART CATH AND CORONARY ANGIOGRAPHY;  Surgeon: Levern Hutching, MD;  Location: [facility];  Service: Cardiovascular;  Laterality: N/A;   LEFT HEART CATH AND CORONARY ANGIOGRAPHY N/A 11/16/2022   Procedure: LEFT HEART CATH AND CORONARY ANGIOGRAPHY;  Surgeon: Ladona Heinz, MD;  Location: [facility]AS[facility] CV LAB;  Service: Cardiovascular;  Laterality: N/A;  FIrst case, 7:30 AM   STENT PLACEMENT VASCULAR ([facility] HX)      MEDICATIONS:  amLODipine  (NORVASC ) 10 MG tablet   aspirin  81 MG chewable tablet   atorvastatin  (LIPITOR ) 80 MG tablet   chlorhexidine (PERIDEX) 0.12 % solution   clopidogrel  (PLAVIX ) 75 MG tablet   diclofenac  Sodium (VOLTAREN  ARTHRITIS PAIN) 1 % GEL   finasteride (PROSCAR) 5 MG tablet   furosemide (LASIX) 20 MG tablet   Iron -FA-B Cmp-C-Biot-Probiotic (FUSION PLUS) CAPS   isosorbide  mononitrate (IMDUR ) 30 MG 24 hr tablet   metoprolol  succinate (TOPROL -XL) 50 MG 24 hr tablet   nitroGLYCERIN  (NITROLINGUAL ) 0.4 MG/SPRAY spray   oxyCODONE (OXY IR/ROXICODONE) 5 MG immediate  release tablet   pantoprazole  (PROTONIX ) 40 MG tablet   Potassium Chloride  ER 20 MEQ TBCR   pramipexole  (MIRAPEX ) 0.125 MG tablet   ranolazine  (RANEXA ) 500 MG 12 hr tablet   rivaroxaban  (XARELTO ) 10 MG TABS tablet   tamsulosin  (FLOMAX ) 0.4 MG CAPS capsule   tiotropium (SPIRIVA) 18 MCG inhalation capsule   No current facility-administered medications for this encounter.     Harlene Hoots Ward, PA-C [facility] [phone]

## 2024-05-09 NOTE — Progress Notes (Signed)
PCR results sent to Dr. Luiz Blare to review. ?

## 2024-05-09 NOTE — Anesthesia Preprocedure Evaluation (Addendum)
 Anesthesia Evaluation  Patient identified by MRN, date of birth, ID band Patient awake    Reviewed: Allergy & Precautions, H&P , NPO status , Patient's Chart, lab work & pertinent test results  Airway Mallampati: II  TM Distance: >3 FB Neck ROM: Full    Dental  (+) Dental Advisory Given   Pulmonary neg pulmonary ROS, former smoker   Pulmonary exam normal breath sounds clear to auscultation       Cardiovascular hypertension, Pt. on medications + CAD, + Past MI and + Cardiac Stents  Normal cardiovascular exam Rhythm:Regular Rate:Normal     Neuro/Psych negative neurological ROS  negative psych ROS   GI/Hepatic negative GI ROS, Neg liver ROS,,,  Endo/Other  negative endocrine ROS    Renal/GU negative Renal ROS  negative genitourinary   Musculoskeletal  (+) Arthritis , Osteoarthritis,    Abdominal   Peds negative pediatric ROS (+)  Hematology  (+) Blood dyscrasia, anemia   Anesthesia Other Findings   Reproductive/Obstetrics negative OB ROS                              Anesthesia Physical Anesthesia Plan  ASA: 3  Anesthesia Plan: Spinal   Post-op Pain Management:    Induction: Intravenous  PONV Risk Score and Plan: 1 and Propofol  infusion and Treatment may vary due to age or medical condition  Airway Management Planned: Simple Face Mask  Additional Equipment:   Intra-op Plan:   Post-operative Plan:   Informed Consent: I have reviewed the patients History and Physical, chart, labs and discussed the procedure including the risks, benefits and alternatives for the proposed anesthesia with the patient or authorized representative who has indicated his/her understanding and acceptance.     Dental advisory given  Plan Discussed with: CRNA  Anesthesia Plan Comments: (See PAT note 05/08/24)         Anesthesia Quick Evaluation

## 2024-05-11 DIAGNOSIS — M1611 Unilateral primary osteoarthritis, right hip: Principal | ICD-10-CM | POA: Diagnosis present

## 2024-05-11 NOTE — H&P (Signed)
 Chief Complaint: right hip pain  HPI: [name] [name] is a 73 y.o. male who presents for evaluation of right hip pain.. It has been present for several years  and has been worsening. He has failed conservative measures. Pain is rated as severe.  Past Medical History:  Diagnosis Date   Anemia    Arthritis    Cataracts, bilateral    Coronary artery disease    Hyperlipidemia    Hypertension    Myocardial infarction (HCC)    Seasonal allergies    Sickle cell trait (HCC)    Past Surgical History:  Procedure Laterality Date   APPENDECTOMY     KNEE ARTHROSCOPY WITH ANTERIOR CRUCIATE LIGAMENT (ACL) REPAIR Left    LEFT HEART CATH AND CORONARY ANGIOGRAPHY N/A 05/11/2020   Procedure: LEFT HEART CATH AND CORONARY ANGIOGRAPHY;  Surgeon: Levern Hutching, MD;  Location: [facility];  Service: Cardiovascular;  Laterality: N/A;   LEFT HEART CATH AND CORONARY ANGIOGRAPHY N/A 11/16/2022   Procedure: LEFT HEART CATH AND CORONARY ANGIOGRAPHY;  Surgeon: Ladona Heinz, MD;  Location: [facility];  Service: Cardiovascular;  Laterality: N/A;  FIrst case, 7:30 AM   STENT PLACEMENT VASCULAR ([facility] HX)     Social History   Socioeconomic History   Marital status: Legally Separated    Spouse name: Not on file   Number of children: Not on file   Years of education: Not on file   Highest education level: Not on file  Occupational History   Occupation: RN at [employer]  Tobacco Use   Smoking status: Former    Types: Cigarettes   Smokeless tobacco: Former    Types: Chew    Quit date: 09/18/1970  Vaping Use   Vaping status: Never Used  Substance and Sexual Activity   Alcohol use: Not Currently   Drug use: No   Sexual activity: Never  Other Topics Concern   Not on file  Social History Narrative   Social History      Diet? regular      Do you drink/eat things with caffeine?      Marital status?               separated                     What year were you married? 1982      Do you  live in a house, apartment, assisted living, condo, trailer, etc.?  (house circled on new patient packet)      Is it one or more stories? yes      How many persons live in your home? 1      Do you have any pets in your home? (please list) no      Highest level of education completed?      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         no                             Type & how often?      Do you have a living will? Yes      Do you have a DNR form?      Yes  If not, do you want to discuss one?      Do you have signed POA/HPOA for forms? yes      Functional Status      Do you have difficulty bathing or dressing yourself? no      Do you have difficulty preparing food or eating?  no      Do you have difficulty managing your medications? no      Do you have difficulty managing your finances? no      Do you have difficulty affording your medications? no   Social Drivers of Corporate investment banker Strain: Not on file  Food Insecurity: Not on file  Transportation Needs: Not on file  Physical Activity: Not on file  Stress: Not on file  Social Connections: Not on file   Family History  Problem Relation Age of Onset   Colon cancer Neg Hx    Esophageal cancer Neg Hx    Rectal cancer Neg Hx    Stomach cancer Neg Hx    Allergies  Allergen Reactions   Shellfish-Derived Products Anaphylaxis, Shortness Of Breath and Other (See Comments)    My throat goes numb   Pork-Derived Products     Does not eat, not allergic   Prior to Admission medications   Medication Sig Start Date End Date Taking? Authorizing Provider  amLODipine  (NORVASC ) 10 MG tablet Take 1 tablet (10 mg total) by mouth daily. 11/16/22  Yes Ladona Heinz, MD  aspirin  81 MG chewable tablet Chew 81 mg by mouth daily.   Yes [provider]  atorvastatin  (LIPITOR ) 80 MG tablet Take 1 tablet (80 mg total) by mouth  daily. Patient taking differently: Take 20 mg by mouth daily. 05/12/20  Yes Levern Hutching, MD  chlorhexidine  (PERIDEX ) 0.12 % solution Use as directed 10 mLs in the mouth or throat 2 (two) times daily. 04/15/24  Yes [provider]  diclofenac  Sodium (VOLTAREN  ARTHRITIS PAIN) 1 % GEL Apply 2 g topically daily as needed (for pain).   Yes [provider]  finasteride  (PROSCAR ) 5 MG tablet Take 5 mg by mouth daily. 02/09/21  Yes [provider]  furosemide  (LASIX ) 20 MG tablet Take 40 mg by mouth daily.   Yes [provider]  Iron -FA-B Cmp-C-Biot-Probiotic (FUSION PLUS) CAPS Take 1 capsule by mouth daily.   Yes [provider]  isosorbide  mononitrate (IMDUR ) 30 MG 24 hr tablet Take 30 mg by mouth daily. 04/06/20  Yes [provider]  metoprolol  succinate (TOPROL -XL) 50 MG 24 hr tablet Take 50 mg by mouth daily. Take with or immediately following a meal.   Yes [provider]  nitroGLYCERIN  (NITROLINGUAL ) 0.4 MG/SPRAY spray Place 1 spray under the tongue every 5 (five) minutes as needed for chest pain.    Yes [provider]  oxyCODONE  (OXY IR/ROXICODONE ) 5 MG immediate release tablet Take 5 mg by mouth 2 (two) times daily as needed for moderate pain (pain score 4-6) or severe pain (pain score 7-10).   Yes [provider]  pantoprazole  (PROTONIX ) 40 MG tablet Take 1 tablet (40 mg total) by mouth daily at 6 (six) AM. 03/28/21  Yes Levern Hutching, MD  Potassium Chloride  ER 20 MEQ TBCR Take 20 mEq by mouth daily.   Yes [provider]  pramipexole  (MIRAPEX ) 0.125 MG tablet Take 0.125 mg by mouth at bedtime. 03/29/20  Yes [provider]  ranolazine  (RANEXA ) 500 MG 12 hr tablet Take 500 mg by mouth 2 (two) times  daily.   Yes [provider]  rivaroxaban  (XARELTO ) 10 MG TABS tablet Take 1 tablet (10 mg total) by mouth daily. 11/16/22  Yes Ladona Heinz, MD  tamsulosin  (FLOMAX ) 0.4 MG CAPS capsule Take 0.8 mg by  mouth at bedtime.  05/23/18  Yes [provider]  tiotropium (SPIRIVA) 18 MCG inhalation capsule Place 18 mcg into inhaler and inhale daily.   Yes [provider]  clopidogrel  (PLAVIX ) 75 MG tablet Take 1 tablet (75 mg total) by mouth daily. Patient not taking: Reported on 06/28/2023 11/16/22   Ladona Heinz, MD     Positive ROS: neg   All other systems have been reviewed and were otherwise negative with the exception of those mentioned in the HPI and as above. Recent Results (from the past 2160 hours)  Basic metabolic panel per protocol     Status: Abnormal   Collection Time: 05/08/24 11:29 AM  Result Value Ref Range   Sodium 141 135 - 145 mmol/L   Potassium 3.5 3.5 - 5.1 mmol/L   Chloride 107 98 - 111 mmol/L   CO2 24 22 - 32 mmol/L   Glucose, Bld 115 (H) 70 - 99 mg/dL    Comment: Glucose reference range applies only to samples taken after fasting for at least 8 hours.   BUN 10 8 - 23 mg/dL   Creatinine, Ser 8.98 0.61 - 1.24 mg/dL   Calcium  9.1 8.9 - 10.3 mg/dL   GFR, Estimated >39 >39 mL/min    Comment: (NOTE) Calculated using the CKD-EPI Creatinine Equation (2021)    Anion gap NOT CALCULATED 5 - 15    Comment: Performed at [facility], [address] [place], [place] [address]  CBC per protocol     Status: Abnormal   Collection Time: 05/08/24 11:29 AM  Result Value Ref Range   WBC 6.7 4.0 - 10.5 K/uL   RBC 4.32 4.22 - 5.81 MIL/uL   Hemoglobin 11.8 (L) 13.0 - 17.0 g/dL   HCT 62.1 (L) 60.9 - 47.9 %   MCV 87.5 80.0 - 100.0 fL   MCH 27.3 26.0 - 34.0 pg   MCHC 31.2 30.0 - 36.0 g/dL   RDW 86.6 88.4 - 84.4 %   Platelets 290 150 - 400 K/uL   nRBC 0.0 0.0 - 0.2 %    Comment: Performed at [facility], [address], [place], [place] [address]  Type and screen [facility]     Status: None   Collection Time: 05/08/24 11:29 AM  Result Value Ref Range   ABO/RH(D) B POS    Antibody Screen NEG    Sample  Expiration 05/22/2024,2359    Extend sample reason      NO TRANSFUSIONS OR PREGNANCY IN THE PAST 3 MONTHS Performed at [facility], [address], [place], [place] [address]   Surgical pcr screen     Status: Abnormal   Collection Time: 05/08/24  2:25 PM   Specimen: Nasal Mucosa; Nasal Swab  Result Value Ref Range   MRSA, PCR NEGATIVE NEGATIVE   Staphylococcus aureus POSITIVE (A) NEGATIVE    Comment: (NOTE) The Xpert SA Assay (FDA approved for NASAL specimens in patients 17 years of age and older), is one component of a comprehensive surveillance program. It is not intended to diagnose infection nor to guide or monitor treatment. Performed at [facility], [address], [place], [place] [address]      Physical Exam: There were  no vitals filed for this visit.  General: Alert, no acute distress Cardiovascular: No pedal edema Respiratory: No cyanosis, no use of accessory musculature GI: No organomegaly, abdomen is soft and non-tender Skin: No lesions in the area of chief complaint Neurologic: Sensation intact distally Psychiatric: Patient is competent for consent with normal mood and affect Lymphatic: No axillary or cervical lymphadenopathy  MUSCULOSKELETAL: Pain with very limited motion of right hip. Antalgic gait.  Assessment/Plan: RIGHT HIP SEVERE DEGENERATIVE JOINT DISEASE Plan for Procedure(s): ARTHROPLASTY, HIP, TOTAL, ANTERIOR APPROACH  Risks and benefits of surgery discussed with pt and he agrees to move forward with right hip surgery.

## 2024-05-12 ENCOUNTER — Ambulatory Visit (HOSPITAL_COMMUNITY)

## 2024-05-12 ENCOUNTER — Encounter (HOSPITAL_COMMUNITY)
Admission: RE | Disposition: A | Discharge status: Home / Self Care | Payer: Self-pay | Source: Home / Self Care | Attending: Orthopedic Surgery

## 2024-05-12 ENCOUNTER — Encounter (HOSPITAL_COMMUNITY): Payer: Self-pay | Admitting: Orthopedic Surgery

## 2024-05-12 ENCOUNTER — Observation Stay (HOSPITAL_COMMUNITY)
Admission: RE | Admit: 2024-05-12 | Discharge: 2024-05-13 | Disposition: A | Discharge status: Home / Self Care | Attending: Orthopedic Surgery | Admitting: Orthopedic Surgery

## 2024-05-12 ENCOUNTER — Ambulatory Visit (HOSPITAL_COMMUNITY): Payer: Self-pay | Admitting: Medical

## 2024-05-12 ENCOUNTER — Ambulatory Visit (HOSPITAL_BASED_OUTPATIENT_CLINIC_OR_DEPARTMENT_OTHER): Admitting: Anesthesiology

## 2024-05-12 ENCOUNTER — Other Ambulatory Visit: Payer: Self-pay

## 2024-05-12 DIAGNOSIS — Z87891 Personal history of nicotine dependence: Secondary | ICD-10-CM | POA: Diagnosis not present

## 2024-05-12 DIAGNOSIS — M1611 Unilateral primary osteoarthritis, right hip: Secondary | ICD-10-CM | POA: Diagnosis not present

## 2024-05-12 DIAGNOSIS — Z79899 Other long term (current) drug therapy: Secondary | ICD-10-CM | POA: Diagnosis not present

## 2024-05-12 DIAGNOSIS — I2510 Atherosclerotic heart disease of native coronary artery without angina pectoris: Secondary | ICD-10-CM

## 2024-05-12 DIAGNOSIS — I10 Essential (primary) hypertension: Secondary | ICD-10-CM | POA: Diagnosis not present

## 2024-05-12 DIAGNOSIS — M25551 Pain in right hip: Secondary | ICD-10-CM | POA: Diagnosis present

## 2024-05-12 HISTORY — PX: TOTAL HIP ARTHROPLASTY: SHX124

## 2024-05-12 LAB — TYPE AND SCREEN
ABO/RH(D): B POS
Antibody Screen: NEGATIVE

## 2024-05-12 LAB — ABO/RH: ABO/RH(D): B POS

## 2024-05-12 SURGERY — ARTHROPLASTY, HIP, TOTAL, ANTERIOR APPROACH
Anesthesia: Spinal | Site: Hip | Laterality: Right

## 2024-05-12 MED ORDER — ALBUMIN HUMAN 5 % IV SOLN
INTRAVENOUS | Status: AC
Start: 2024-05-12 — End: 2024-05-12
  Filled 2024-05-12: qty 250

## 2024-05-12 MED ORDER — TRANEXAMIC ACID-NACL 1000-0.7 MG/100ML-% IV SOLN
1000.0000 mg | INTRAVENOUS | Status: AC
  Administered 2024-05-12: 1000 mg via INTRAVENOUS
  Filled 2024-05-12: qty 100

## 2024-05-12 MED ORDER — NITROGLYCERIN 0.4 MG/SPRAY TL SOLN
1.0000 | Status: DC | PRN
  Administered 2024-05-13 (×2): 1 via SUBLINGUAL
  Filled 2024-05-12 (×3): qty 4.9

## 2024-05-12 MED ORDER — HYDROMORPHONE HCL 1 MG/ML IJ SOLN
INTRAMUSCULAR | Status: AC
Start: 2024-05-12 — End: 2024-05-12
  Filled 2024-05-12: qty 1

## 2024-05-12 MED ORDER — HYDROMORPHONE HCL 1 MG/ML IJ SOLN: 0.5000 mg | INTRAMUSCULAR | Status: DC | PRN

## 2024-05-12 MED ORDER — CEFAZOLIN SODIUM-DEXTROSE 2-4 GM/100ML-% IV SOLN
2.0000 g | Freq: Four times a day (QID) | INTRAVENOUS | Status: AC
  Administered 2024-05-12 (×2): 2 g via INTRAVENOUS
  Filled 2024-05-12 (×2): qty 100

## 2024-05-12 MED ORDER — CHLORHEXIDINE GLUCONATE 0.12 % MT SOLN: 15.0000 mL | Freq: Once | OROMUCOSAL | Status: DC

## 2024-05-12 MED ORDER — MIDAZOLAM HCL 2 MG/2ML IJ SOLN
INTRAMUSCULAR | Status: AC
  Filled 2024-05-12: qty 2

## 2024-05-12 MED ORDER — TRANEXAMIC ACID-NACL 1000-0.7 MG/100ML-% IV SOLN
1000.0000 mg | Freq: Once | INTRAVENOUS | Status: AC
  Administered 2024-05-12: 1000 mg via INTRAVENOUS
  Filled 2024-05-12: qty 100

## 2024-05-12 MED ORDER — EPHEDRINE 5 MG/ML INJ
INTRAVENOUS | Status: AC
Start: 2024-05-12 — End: 2024-05-12
  Filled 2024-05-12: qty 5

## 2024-05-12 MED ORDER — BUPIVACAINE IN DEXTROSE 0.75-8.25 % IT SOLN
INTRATHECAL | Status: DC | PRN
Start: 2024-05-12 — End: 2024-05-12
  Administered 2024-05-12: 1.8 mL via INTRATHECAL

## 2024-05-12 MED ORDER — DEXAMETHASONE SODIUM PHOSPHATE 10 MG/ML IJ SOLN
INTRAMUSCULAR | Status: DC | PRN
Start: 2024-05-12 — End: 2024-05-12
  Administered 2024-05-12: 8 mg via INTRAVENOUS

## 2024-05-12 MED ORDER — PHENYLEPHRINE HCL (PRESSORS) 10 MG/ML IV SOLN
INTRAVENOUS | Status: DC | PRN
  Administered 2024-05-12: 100 ug via INTRAVENOUS

## 2024-05-12 MED ORDER — BUPIVACAINE-EPINEPHRINE (PF) 0.25% -1:200000 IJ SOLN
INTRAMUSCULAR | Status: DC | PRN
  Administered 2024-05-12: 50 mL

## 2024-05-12 MED ORDER — PROPOFOL 1000 MG/100ML IV EMUL
INTRAVENOUS | Status: AC
Start: 2024-05-12 — End: 2024-05-12
  Filled 2024-05-12: qty 100

## 2024-05-12 MED ORDER — ZOLPIDEM TARTRATE 5 MG PO TABS: 5.0000 mg | ORAL_TABLET | Freq: Every evening | ORAL | Status: DC | PRN

## 2024-05-12 MED ORDER — HYDROMORPHONE HCL 1 MG/ML IJ SOLN
0.2500 mg | INTRAMUSCULAR | Status: DC | PRN
  Administered 2024-05-12 (×3): 0.5 mg via INTRAVENOUS

## 2024-05-12 MED ORDER — ACETAMINOPHEN 325 MG PO TABS: 325.0000 mg | ORAL_TABLET | Freq: Four times a day (QID) | ORAL | Status: DC | PRN

## 2024-05-12 MED ORDER — ALBUMIN HUMAN 5 % IV SOLN: INTRAVENOUS | Status: DC | PRN

## 2024-05-12 MED ORDER — OXYCODONE HCL 5 MG PO TABS
ORAL_TABLET | ORAL | Status: AC
  Filled 2024-05-12: qty 1

## 2024-05-12 MED ORDER — DEXAMETHASONE SODIUM PHOSPHATE 10 MG/ML IJ SOLN
10.0000 mg | Freq: Two times a day (BID) | INTRAMUSCULAR | Status: AC
  Administered 2024-05-12 – 2024-05-13 (×2): 10 mg via INTRAVENOUS
  Filled 2024-05-12 (×2): qty 1

## 2024-05-12 MED ORDER — FUROSEMIDE 40 MG PO TABS
40.0000 mg | ORAL_TABLET | Freq: Every day | ORAL | Status: DC
  Administered 2024-05-12 – 2024-05-13 (×2): 40 mg via ORAL
  Filled 2024-05-12 (×2): qty 1

## 2024-05-12 MED ORDER — POTASSIUM CHLORIDE CRYS ER 10 MEQ PO TBCR
20.0000 meq | EXTENDED_RELEASE_TABLET | Freq: Every day | ORAL | Status: DC
  Administered 2024-05-12 – 2024-05-13 (×2): 20 meq via ORAL
  Filled 2024-05-12 (×2): qty 2

## 2024-05-12 MED ORDER — PHENYLEPHRINE 80 MCG/ML (10ML) SYRINGE FOR IV PUSH (FOR BLOOD PRESSURE SUPPORT)
PREFILLED_SYRINGE | INTRAVENOUS | Status: AC
  Filled 2024-05-12: qty 10

## 2024-05-12 MED ORDER — FENTANYL CITRATE (PF) 100 MCG/2ML IJ SOLN
INTRAMUSCULAR | Status: DC | PRN
  Administered 2024-05-12: 50 ug via INTRAVENOUS
  Administered 2024-05-12: 25 ug via INTRAVENOUS

## 2024-05-12 MED ORDER — UMECLIDINIUM BROMIDE 62.5 MCG/ACT IN AEPB
1.0000 | INHALATION_SPRAY | Freq: Every day | RESPIRATORY_TRACT | Status: DC
  Administered 2024-05-13: 1 via RESPIRATORY_TRACT
  Filled 2024-05-12: qty 7

## 2024-05-12 MED ORDER — OXYCODONE HCL 5 MG PO TABS
5.0000 mg | ORAL_TABLET | ORAL | Status: DC | PRN
  Administered 2024-05-12: 5 mg via ORAL
  Administered 2024-05-12 – 2024-05-13 (×2): 10 mg via ORAL
  Filled 2024-05-12: qty 1
  Filled 2024-05-12 (×2): qty 2

## 2024-05-12 MED ORDER — SODIUM CHLORIDE 0.9 % IV SOLN: 12.5000 mg | INTRAVENOUS | Status: DC | PRN

## 2024-05-12 MED ORDER — METHOCARBAMOL 500 MG PO TABS
500.0000 mg | ORAL_TABLET | Freq: Four times a day (QID) | ORAL | Status: DC | PRN
  Administered 2024-05-12: 500 mg via ORAL
  Filled 2024-05-12: qty 1

## 2024-05-12 MED ORDER — PHENYLEPHRINE HCL-NACL 20-0.9 MG/250ML-% IV SOLN
INTRAVENOUS | Status: DC | PRN
  Administered 2024-05-12: 30 ug/min via INTRAVENOUS

## 2024-05-12 MED ORDER — AMLODIPINE BESYLATE 10 MG PO TABS
10.0000 mg | ORAL_TABLET | Freq: Every day | ORAL | Status: DC
  Administered 2024-05-13: 10 mg via ORAL
  Filled 2024-05-12: qty 1

## 2024-05-12 MED ORDER — RANOLAZINE ER 500 MG PO TB12
500.0000 mg | ORAL_TABLET | Freq: Two times a day (BID) | ORAL | Status: DC
  Administered 2024-05-12 – 2024-05-13 (×2): 500 mg via ORAL
  Filled 2024-05-12 (×2): qty 1

## 2024-05-12 MED ORDER — BUPIVACAINE LIPOSOME 1.3 % IJ SUSP: 10.0000 mL | Freq: Once | INTRAMUSCULAR | Status: DC

## 2024-05-12 MED ORDER — OXYCODONE HCL 5 MG PO TABS
5.0000 mg | ORAL_TABLET | Freq: Once | ORAL | Status: AC | PRN
  Administered 2024-05-12: 5 mg via ORAL

## 2024-05-12 MED ORDER — BUPIVACAINE LIPOSOME 1.3 % IJ SUSP
INTRAMUSCULAR | Status: AC
Start: 2024-05-12 — End: 2024-05-12
  Filled 2024-05-12: qty 20

## 2024-05-12 MED ORDER — TAMSULOSIN HCL 0.4 MG PO CAPS: 0.8000 mg | ORAL_CAPSULE | Freq: Every day | ORAL | Status: DC

## 2024-05-12 MED ORDER — METHOCARBAMOL 1000 MG/10ML IJ SOLN: 500.0000 mg | Freq: Four times a day (QID) | INTRAMUSCULAR | Status: DC | PRN

## 2024-05-12 MED ORDER — POLYETHYLENE GLYCOL 3350 17 G PO PACK: 17.0000 g | PACK | Freq: Every day | ORAL | Status: DC | PRN

## 2024-05-12 MED ORDER — SODIUM CHLORIDE 0.9 % IV SOLN: INTRAVENOUS | Status: DC

## 2024-05-12 MED ORDER — POVIDONE-IODINE 10 % EX SWAB: 2.0000 | Freq: Once | CUTANEOUS | Status: DC

## 2024-05-12 MED ORDER — DEXAMETHASONE SODIUM PHOSPHATE 10 MG/ML IJ SOLN
INTRAMUSCULAR | Status: AC
  Filled 2024-05-12: qty 1

## 2024-05-12 MED ORDER — METOPROLOL SUCCINATE ER 50 MG PO TB24
50.0000 mg | ORAL_TABLET | Freq: Every day | ORAL | Status: DC
  Administered 2024-05-13: 50 mg via ORAL
  Filled 2024-05-12: qty 1

## 2024-05-12 MED ORDER — LIDOCAINE HCL (CARDIAC) PF 100 MG/5ML IV SOSY
PREFILLED_SYRINGE | INTRAVENOUS | Status: DC | PRN
  Administered 2024-05-12: 40 mg via INTRAVENOUS

## 2024-05-12 MED ORDER — OXYCODONE HCL 5 MG/5ML PO SOLN: 5.0000 mg | Freq: Once | ORAL | Status: AC | PRN

## 2024-05-12 MED ORDER — ORAL CARE MOUTH RINSE: 15.0000 mL | Freq: Once | OROMUCOSAL | Status: DC

## 2024-05-12 MED ORDER — BUPIVACAINE LIPOSOME 1.3 % IJ SUSP
INTRAMUSCULAR | Status: AC
  Filled 2024-05-12: qty 10

## 2024-05-12 MED ORDER — MIDAZOLAM HCL 5 MG/5ML IJ SOLN
INTRAMUSCULAR | Status: DC | PRN
  Administered 2024-05-12: .5 mg via INTRAVENOUS
  Administered 2024-05-12: 1 mg via INTRAVENOUS

## 2024-05-12 MED ORDER — WATER FOR IRRIGATION, STERILE IR SOLN
Status: DC | PRN
  Administered 2024-05-12: 2000 mL

## 2024-05-12 MED ORDER — ONDANSETRON HCL 4 MG/2ML IJ SOLN: 4.0000 mg | Freq: Four times a day (QID) | INTRAMUSCULAR | Status: DC | PRN

## 2024-05-12 MED ORDER — PANTOPRAZOLE SODIUM 40 MG PO TBEC
40.0000 mg | DELAYED_RELEASE_TABLET | Freq: Every day | ORAL | Status: DC
  Administered 2024-05-13: 40 mg via ORAL
  Filled 2024-05-12: qty 1

## 2024-05-12 MED ORDER — ONDANSETRON HCL 4 MG PO TABS: 4.0000 mg | ORAL_TABLET | Freq: Four times a day (QID) | ORAL | Status: DC | PRN

## 2024-05-12 MED ORDER — EPHEDRINE 5 MG/ML INJ
INTRAVENOUS | Status: AC
  Filled 2024-05-12: qty 5

## 2024-05-12 MED ORDER — CEFAZOLIN SODIUM-DEXTROSE 2-4 GM/100ML-% IV SOLN
2.0000 g | INTRAVENOUS | Status: AC
  Administered 2024-05-12: 2 g via INTRAVENOUS
  Filled 2024-05-12: qty 100

## 2024-05-12 MED ORDER — FINASTERIDE 5 MG PO TABS
5.0000 mg | ORAL_TABLET | Freq: Every day | ORAL | Status: DC
  Administered 2024-05-13: 5 mg via ORAL
  Filled 2024-05-12: qty 1

## 2024-05-12 MED ORDER — ALBUMIN HUMAN 5 % IV SOLN
INTRAVENOUS | Status: AC
  Filled 2024-05-12: qty 250

## 2024-05-12 MED ORDER — ACETAMINOPHEN 500 MG PO TABS
1000.0000 mg | ORAL_TABLET | Freq: Four times a day (QID) | ORAL | Status: AC
  Administered 2024-05-12 – 2024-05-13 (×4): 1000 mg via ORAL
  Filled 2024-05-12 (×4): qty 2

## 2024-05-12 MED ORDER — ALUM & MAG HYDROXIDE-SIMETH 200-200-20 MG/5ML PO SUSP: 30.0000 mL | ORAL | Status: DC | PRN

## 2024-05-12 MED ORDER — FENTANYL CITRATE (PF) 100 MCG/2ML IJ SOLN
INTRAMUSCULAR | Status: AC
  Filled 2024-05-12: qty 2

## 2024-05-12 MED ORDER — BUPIVACAINE-EPINEPHRINE (PF) 0.25% -1:200000 IJ SOLN
INTRAMUSCULAR | Status: AC
Start: 2024-05-12 — End: 2024-05-12
  Filled 2024-05-12: qty 30

## 2024-05-12 MED ORDER — EPHEDRINE SULFATE (PRESSORS) 50 MG/ML IJ SOLN
INTRAMUSCULAR | Status: DC | PRN
  Administered 2024-05-12 (×2): 5 mg via INTRAVENOUS

## 2024-05-12 MED ORDER — NITROGLYCERIN 0.4 MG SL SUBL
0.4000 mg | SUBLINGUAL_TABLET | SUBLINGUAL | Status: DC | PRN
  Administered 2024-05-12 (×3): 0.4 mg via SUBLINGUAL

## 2024-05-12 MED ORDER — NITROGLYCERIN 0.4 MG SL SUBL
SUBLINGUAL_TABLET | SUBLINGUAL | Status: AC
  Filled 2024-05-12: qty 1

## 2024-05-12 MED ORDER — PRAMIPEXOLE DIHYDROCHLORIDE 0.25 MG PO TABS
0.1250 mg | ORAL_TABLET | Freq: Every day | ORAL | Status: DC
  Administered 2024-05-12: 0.125 mg via ORAL
  Filled 2024-05-12: qty 1

## 2024-05-12 MED ORDER — DOCUSATE SODIUM 100 MG PO CAPS
100.0000 mg | ORAL_CAPSULE | Freq: Two times a day (BID) | ORAL | Status: DC
  Administered 2024-05-12 – 2024-05-13 (×2): 100 mg via ORAL
  Filled 2024-05-12 (×2): qty 1

## 2024-05-12 MED ORDER — ALBUMIN HUMAN 5 % IV SOLN
12.5000 g | Freq: Once | INTRAVENOUS | Status: AC
  Administered 2024-05-12: 12.5 g via INTRAVENOUS

## 2024-05-12 MED ORDER — BUPIVACAINE-EPINEPHRINE (PF) 0.25% -1:200000 IJ SOLN
INTRAMUSCULAR | Status: AC
  Filled 2024-05-12: qty 30

## 2024-05-12 MED ORDER — ONDANSETRON HCL 4 MG/2ML IJ SOLN
INTRAMUSCULAR | Status: DC | PRN
  Administered 2024-05-12: 4 mg via INTRAVENOUS

## 2024-05-12 MED ORDER — LACTATED RINGERS IV SOLN: INTRAVENOUS | Status: DC

## 2024-05-12 MED ORDER — ISOSORBIDE MONONITRATE ER 30 MG PO TB24
30.0000 mg | ORAL_TABLET | Freq: Every day | ORAL | Status: DC
  Administered 2024-05-13: 30 mg via ORAL
  Filled 2024-05-12: qty 1

## 2024-05-12 MED ORDER — BISACODYL 5 MG PO TBEC: 5.0000 mg | DELAYED_RELEASE_TABLET | Freq: Every day | ORAL | Status: DC | PRN

## 2024-05-12 MED ORDER — RIVAROXABAN 10 MG PO TABS
10.0000 mg | ORAL_TABLET | Freq: Every day | ORAL | Status: DC
  Administered 2024-05-13: 10 mg via ORAL
  Filled 2024-05-12: qty 1

## 2024-05-12 MED ORDER — 0.9 % SODIUM CHLORIDE (POUR BTL) OPTIME
TOPICAL | Status: DC | PRN
  Administered 2024-05-12: 1000 mL

## 2024-05-12 MED ORDER — PROPOFOL 500 MG/50ML IV EMUL
INTRAVENOUS | Status: DC | PRN
  Administered 2024-05-12: 50 ug/kg/min via INTRAVENOUS

## 2024-05-12 MED ORDER — MAGNESIUM CITRATE PO SOLN: 1.0000 | Freq: Once | ORAL | Status: DC | PRN

## 2024-05-12 SURGICAL SUPPLY — 41 items
BAG COUNTER SPONGE SURGICOUNT (BAG) IMPLANT
BAG ZIPLOCK 12X15 (MISCELLANEOUS) IMPLANT
BENZOIN TINCTURE PRP APPL 2/3 (GAUZE/BANDAGES/DRESSINGS) IMPLANT
BLADE SAW SGTL 18X1.27X75 (BLADE) ×1 IMPLANT
COVER PERINEAL POST (MISCELLANEOUS) ×1 IMPLANT
COVER SURGICAL LIGHT HANDLE (MISCELLANEOUS) ×1 IMPLANT
CUP PINN GRIPTON 58 100 (Cup) IMPLANT
DRAPE FOOT SWITCH (DRAPES) ×1 IMPLANT
DRAPE STERI IOBAN 125X83 (DRAPES) ×1 IMPLANT
DRAPE U-SHAPE 47X51 STRL (DRAPES) ×2 IMPLANT
DRSG AQUACEL AG ADV 3.5X 6 (GAUZE/BANDAGES/DRESSINGS) ×1 IMPLANT
DURAPREP 26ML APPLICATOR (WOUND CARE) ×1 IMPLANT
ELECT PENCIL ROCKER SW 15FT (MISCELLANEOUS) ×1 IMPLANT
ELECT REM PT RETURN 15FT ADLT (MISCELLANEOUS) ×1 IMPLANT
ELIMINATOR HOLE APEX DEPUY (Hips) IMPLANT
GAUZE XEROFORM 1X8 LF (GAUZE/BANDAGES/DRESSINGS) IMPLANT
GLOVE BIOGEL PI IND STRL 8 (GLOVE) ×2 IMPLANT
GLOVE ECLIPSE 7.5 STRL STRAW (GLOVE) ×2 IMPLANT
GOWN STRL REUS W/ TWL XL LVL3 (GOWN DISPOSABLE) ×2 IMPLANT
HEAD CERAMIC 36 PLUS5 (Hips) IMPLANT
HOLDER FOLEY CATH W/STRAP (MISCELLANEOUS) ×1 IMPLANT
HOOD PEEL AWAY T7 (MISCELLANEOUS) ×3 IMPLANT
KIT TURNOVER KIT A (KITS) ×1 IMPLANT
LINER NEUTRAL 36X58 PLUS4 IMPLANT
NDL HYPO 22X1.5 SAFETY MO (MISCELLANEOUS) ×2 IMPLANT
NEEDLE HYPO 22X1.5 SAFETY MO (MISCELLANEOUS) ×2 IMPLANT
PACK ANTERIOR HIP CUSTOM (KITS) ×1 IMPLANT
SPIKE FLUID TRANSFER (MISCELLANEOUS) ×1 IMPLANT
STAPLER SKIN PROX 35W (STAPLE) IMPLANT
STEM FEM ACTIS STD SZ4 (Stem) IMPLANT
STRIP CLOSURE SKIN 1/2X4 (GAUZE/BANDAGES/DRESSINGS) IMPLANT
SUT ETHIBOND NAB CT1 #1 30IN (SUTURE) ×2 IMPLANT
SUT MNCRL AB 3-0 PS2 18 (SUTURE) IMPLANT
SUT VIC AB 0 CT1 36 (SUTURE) ×1 IMPLANT
SUT VIC AB 1 CT1 36 (SUTURE) ×1 IMPLANT
SUT VIC AB 2-0 CT1 TAPERPNT 27 (SUTURE) ×1 IMPLANT
SUT VICRYL+ 3-0 36IN CT-1 (SUTURE) IMPLANT
TRAY CATH INTERMITTENT SS 16FR (CATHETERS) IMPLANT
TRAY FOLEY MTR SLVR 16FR STAT (SET/KITS/TRAYS/PACK) IMPLANT
TUBE SUCTION HIGH CAP CLEAR NV (SUCTIONS) ×1 IMPLANT
WATER STERILE IRR 1000ML POUR (IV SOLUTION) IMPLANT

## 2024-05-12 NOTE — Discharge Instructions (Signed)

## 2024-05-12 NOTE — Progress Notes (Signed)
   05/12/24 1600  Spiritual Encounters  Type of Visit Initial  Care provided to: Pt and family  Referral source Patient request  Reason for visit Advance directives  OnCall Visit No   Per spiritual care consult request for HCPOA education, I visited with Mr. [name] [name] and his daughter, Geni Kitty.  Mr. Yamashiro was in good spirits and welcomed my visit. He confirmed interest in HCPOA completion.  I provided an overview of the document and some factors to consider. I offered some relational support and established a relationship of care with Mr. Marcussen and his daughter. I let them know a chaplain could potentially help notarize tomorrow 8/26, so they will let care team know if that is needed.  Luane Rochon L. Fredrica, M.Div [phone]

## 2024-05-12 NOTE — Evaluation (Signed)
 Physical Therapy Evaluation Patient Details Name: [name] [name] MRN: [id number] DOB: 1951-03-20 Today's Date: 05/12/2024  History of Present Illness  73 yo male presents to therapy s/p R THA, anterior approach on 05/12/2024 due to failure of conservative measures. Pt PMH includes but is not limited to: anemia, arthritis, HLD, HTN, MI, sickle cell trait, and cardiac surgeries.  Clinical Impression    KALID GHAN is a 73 y.o. male POD 0 s/p R THA. Patient reports Mod I with mobility at baseline. Patient is now limited by functional impairments (see PT problem list below) and requires S for bed mobility and CGA for transfers. Patient was able to ambulate 65 feet with RW and CGA level of assist. Patient instructed in exercise to facilitate ROM and circulation to manage edema.  Patient will benefit from continued skilled PT interventions to address impairments and progress towards PLOF. Acute PT will follow to progress mobility and stair training in preparation for safe discharge home with family and social support with [facility] services.       If plan is discharge home, recommend the following: A little help with walking and/or transfers;A little help with bathing/dressing/bathroom;Assistance with cooking/housework;Assist for transportation;Help with stairs or ramp for entrance   Can travel by private vehicle        Equipment Recommendations Rolling walker (2 wheels)  Recommendations for Other Services       Functional Status Assessment Patient has had a recent decline in their functional status and demonstrates the ability to make significant improvements in function in a reasonable and predictable amount of time.     Precautions / Restrictions Precautions Precautions: Fall Restrictions Weight Bearing Restrictions Per Provider Order: No      Mobility  Bed Mobility Overal bed mobility: Needs Assistance Bed Mobility: Supine to Sit     Supine to sit: Supervision, HOB elevated, Used  rails     General bed mobility comments: min cues    Transfers Overall transfer level: Needs assistance Equipment used: Rolling walker (2 wheels) Transfers: Sit to/from Stand Sit to Stand: Contact guard assist           General transfer comment: pt able to push to stand from EOB to RW min cues    Ambulation/Gait Ambulation/Gait assistance: Contact guard assist Gait Distance (Feet): 65 Feet Assistive device: Rolling walker (2 wheels) Gait Pattern/deviations: Step-to pattern, Decreased stance time - right, Antalgic, Trunk flexed Gait velocity: decreased     General Gait Details: trunk flexion with B UE support at RW to offload R LE in stance phase, pt reports trunk flexion at baseline and required frequent cues for extension posture and unable to maintain, min cues for safety, RW management and pt able to perofrm step almost through pattern  Stairs            Wheelchair Mobility     Tilt Bed    Modified Rankin (Stroke Patients Only)       Balance Overall balance assessment: Needs assistance Sitting-balance support: Feet supported Sitting balance-Leahy Scale: Good     Standing balance support: Bilateral upper extremity supported, During functional activity, Reliant on assistive device for balance Standing balance-Leahy Scale: Poor                               Pertinent Vitals/Pain Pain Assessment Pain Assessment: 0-10 Pain Score: 4  Pain Location: R hip and LE Pain Descriptors / Indicators: Aching, Discomfort, Dull, Grimacing, Operative  site guarding Pain Intervention(s): Limited activity within patient's tolerance, Monitored during session, Premedicated before session, Repositioned, Patient requesting pain meds-RN notified, Ice applied    Home Living Family/patient expects to be discharged to:: Private residence Living Arrangements: Alone Available Help at Discharge: Family;Friend(s) Type of Home: House Home Access: Stairs to  enter Entrance Stairs-Rails: Doctor, general practice of Steps: 8 Alternate Level Stairs-Number of Steps: 13 Home Layout: Two level;Bed/bath upstairs Home Equipment: Cane - single point;Shower seat      Prior Function Prior Level of Function : Independent/Modified Independent;Driving             Mobility Comments: mod I with use of SPC for all ADLs, self care tasks and IADLs       Extremity/Trunk Assessment        Lower Extremity Assessment Lower Extremity Assessment: RLE deficits/detail RLE Deficits / Details: ankle DF/PF 5/5 RLE Sensation: WNL    Cervical / Trunk Assessment Cervical / Trunk Assessment: Normal  Communication   Communication Communication: No apparent difficulties    Cognition Arousal: Alert Behavior During Therapy: WFL for tasks assessed/performed   PT - Cognitive impairments: No apparent impairments                         Following commands: Intact       Cueing       General Comments      Exercises Total Joint Exercises Ankle Circles/Pumps: AROM, Both, 10 reps   Assessment/Plan    PT Assessment Patient needs continued PT services  PT Problem List Decreased strength;Decreased range of motion;Decreased activity tolerance;Decreased balance;Decreased mobility;Decreased coordination;Pain       PT Treatment Interventions DME instruction;Gait training;Stair training;Functional mobility training;Therapeutic activities;Therapeutic exercise;Balance training;Neuromuscular re-education;Patient/family education;Modalities    PT Goals (Current goals can be found in the Care Plan section)  Acute Rehab PT Goals Patient Stated Goal: to be able to walk up tall and no pain PT Goal Formulation: With patient Time For Goal Achievement: 05/26/24 Potential to Achieve Goals: Good    Frequency 7X/week     Co-evaluation               AM-PAC PT 6 Clicks Mobility  Outcome Measure Help needed turning from your back to  your side while in a flat bed without using bedrails?: None Help needed moving from lying on your back to sitting on the side of a flat bed without using bedrails?: A Little Help needed moving to and from a bed to a chair (including a wheelchair)?: A Little Help needed standing up from a chair using your arms (e.g., wheelchair or bedside chair)?: A Little Help needed to walk in hospital room?: A Little Help needed climbing 3-5 steps with a railing? : A Lot 6 Click Score: 18    End of Session Equipment Utilized During Treatment: Gait belt Activity Tolerance: Patient tolerated treatment well Patient left: in chair;with call bell/phone within reach;with chair alarm set;with family/visitor present Nurse Communication: Mobility status;Patient requests pain meds PT Visit Diagnosis: Unsteadiness on feet (R26.81);Other abnormalities of gait and mobility (R26.89);Muscle weakness (generalized) (M62.81);Difficulty in walking, not elsewhere classified (R26.2);Pain Pain - Right/Left: Right Pain - part of body: Hip;Leg    Time: 8178-8148 PT Time Calculation (min) (ACUTE ONLY): 30 min   Charges:   PT Evaluation $PT Eval Low Complexity: 1 Low PT Treatments $Gait Training: 8-22 mins PT General Charges $$ ACUTE PT VISIT: 1 Visit         [place],  PT Acute Rehab    [place] VEAR Drone 05/12/2024, 7:07 PM

## 2024-05-12 NOTE — Plan of Care (Signed)
   Problem: Education: Goal: Knowledge of General Education information will improve Description Including pain rating scale, medication(s)/side effects and non-pharmacologic comfort measures Outcome: Progressing

## 2024-05-12 NOTE — Plan of Care (Signed)

## 2024-05-12 NOTE — Transfer of Care (Signed)
 Immediate Anesthesia Transfer of Care Note  Patient: [name] [name]  Procedure(s) Performed: ARTHROPLASTY, HIP, TOTAL, ANTERIOR APPROACH (Right: Hip)  Patient Location: PACU  Anesthesia Type:Spinal  Level of Consciousness: awake, alert , oriented, and patient cooperative  Airway & Oxygen Therapy: Patient Spontanous Breathing and Patient connected to face mask oxygen  Post-op Assessment: Report given to RN and Post -op Vital signs reviewed and stable  Post vital signs: Reviewed and stable  Last Vitals:  Vitals Value Taken Time  BP 90/63 05/12/24 12:34  Temp    Pulse 58 05/12/24 12:36  Resp 28 05/12/24 12:36  SpO2 97 % 05/12/24 12:36  Vitals shown include unfiled device data.  Last Pain:  Vitals:   05/12/24 0827  TempSrc: Oral  PainSc:          Complications: No notable events documented.

## 2024-05-12 NOTE — Op Note (Signed)
 PATIENT ID:      [name] [name]  MRN:     [id number] DOB/AGE:    [birth year] / 73 y.o.       OPERATIVE REPORT    DATE OF PROCEDURE:  05/12/2024       PREOPERATIVE DIAGNOSIS:  RIGHT HIP SEVERE DEGENERATIVE JOINT DISEASE                                                       Estimated body mass index is 25.75 kg/m as calculated from the following:   Height as of this encounter: 5' 11 (1.803 m).   Weight as of this encounter: 83.7 kg.     POSTOPERATIVE DIAGNOSIS:  RIGHT HIP SEVERE DEGENERATIVE JOINT DISEASE                                                           PROCEDURE:  1. right total hip arthroplasty using a 58 mm DePuy Pinnacle  Cup, [employer],  neutral liner, a +5 36 mm ceramic head,  and a #4  Actis stem, 2.interpretation of multiple intraoperative fluoroscopic images   SURGEON: [name]    ASSISTANT:   Signe Reining PA-C  (present throughout entire procedure and necessary for timely completion of the procedure)  ANESTHESIA: spinal  BLOOD LOSS: 300cc Tranexamic Acid : 1 gram IV DRAINS: None COMPLICATIONS: None    NDICATIONS FOR PROCEDURE:Patient with end-stage arthritis of the right hip.  X-rays show bone-on-bone arthritic changes. Despite conservative measures with observation, anti-inflammatory medicine, narcotics, use of a cane, has severe unremitting pain and can ambulate only less than 1 block before resting.  Patient desires elective right total hip arthroplasty to decrease pain and increase function. The risks, benefits, and alternatives were discussed at length including but not limited to the risks of infection, bleeding, nerve injury, stiffness, blood clots, the need for revision surgery, cardiopulmonary complications, among others, and they were willing to proceed.Benefits have been discussed. Questions answered.     PROCEDURE IN DETAIL: The patient was identified by armband,  received preoperative IV antibiotics in the holding area at [facility], taken to the operating room , appropriate anesthetic monitors  were attached and spinal anesthesia was induced.  The patient was placed onto the hot bed and all bony prominences were well-padded.The right hip was prepped and draped for an anterior approach to the hip.  An incision was made and the subcutaneous dissection was down to the level of the tensor fascia.  The fascia was opened and finger dissected.  The bleeders coming across the anterior portion of the hip were identified and cauterized. Retractors were put in place above and below the femoral neck.  The capsule was opened and tagged and a provisional neck cut was made.  The head was removed and sized on the back table.  The acetabulum was sequentially reamed to a level of 57mm and a 58 mm porous-coated pinnacle cup was hammered into place with 45 of lateral opening and 30 of anteversion.fluoroscopy was used to ensure this position of the cup.  Attention was turned towards the femur where the  leg was actually rotated, extended, and adduction did.  The femur was sequentially broached until a size of 4broach gave a perfect fit and fill.at this point a  1+1mm delta ceramic hip ball was placed and the hip reduced.  Fluoroscopic images were taken to assess the leg length, fit and fill of the stem, and cup position.  We were happy with the construct at this point.  The 4 broach was removed and a final Actis stem with standard offset  and a 1.5 mm ceramic hip ball was placed and reduced.  Final images were taken to make certain there were happy with the position at this point.   The capsule was closed with #1 Vicryl suture.  The tensor fascia was closed with 0 Vicryl suture.  The skin was then closed with combination of 0 and 2-0 Vicryl suture.  The top layer was with 3-0 Monocryl suture.  Benzoin and Steri-Strips were applied  and a sterile compressive dressing was applied and the patient taken to recovery room she noted be in satisfactory  condition.  Past medical Motion for the procedure was approximately 300 cc.  Of note Signe Reining was present  the entire case and assisted by retraction of tissues, manipulation of the leg, and closing the minimize or time.     [name] 05/12/2024, 5:25 PM

## 2024-05-12 NOTE — Interval H&P Note (Signed)
 History and Physical Interval Note:  05/12/2024 9:14 AM  [name] [name]  has presented today for surgery, with the diagnosis of RIGHT HIP SEVERE DEGENERATIVE JOINT DISEASE.  The various methods of treatment have been discussed with the patient and family. After consideration of risks, benefits and other options for treatment, the patient has consented to  Procedure(s): ARTHROPLASTY, HIP, TOTAL, ANTERIOR APPROACH (Right) as a surgical intervention.  The patient's history has been reviewed, patient examined, no change in status, stable for surgery.  I have reviewed the patient's chart and labs.  Questions were answered to the patient's satisfaction.     [name]

## 2024-05-12 NOTE — Anesthesia Procedure Notes (Signed)
 Spinal  Patient location during procedure: OR Start time: 05/12/2024 10:31 AM Reason for block: surgical anesthesia Staffing Performed: resident/CRNA  Resident/CRNA: Kathern Rollene LABOR, CRNA Performed by: Kathern Rollene LABOR, CRNA Authorized by: Cleotilde Butler Dade, MD   Preanesthetic Checklist Completed: patient identified, IV checked, site marked, risks and benefits discussed, surgical consent, monitors and equipment checked, pre-op evaluation and timeout performed Spinal Block Patient position: sitting Prep: DuraPrep Patient monitoring: heart rate, cardiac monitor, continuous pulse ox and blood pressure Approach: midline Location: L3-4 Injection technique: single-shot Needle Needle type: Pencan  Needle gauge: 24 G Needle length: 9 cm Needle insertion depth: 4 cm Assessment Sensory level: T4 Events: CSF return

## 2024-05-13 ENCOUNTER — Encounter (HOSPITAL_COMMUNITY): Payer: Self-pay | Admitting: Orthopedic Surgery

## 2024-05-13 DIAGNOSIS — Z79899 Other long term (current) drug therapy: Secondary | ICD-10-CM | POA: Diagnosis not present

## 2024-05-13 DIAGNOSIS — I10 Essential (primary) hypertension: Secondary | ICD-10-CM | POA: Diagnosis not present

## 2024-05-13 DIAGNOSIS — Z87891 Personal history of nicotine dependence: Secondary | ICD-10-CM | POA: Diagnosis not present

## 2024-05-13 DIAGNOSIS — M1611 Unilateral primary osteoarthritis, right hip: Secondary | ICD-10-CM | POA: Diagnosis not present

## 2024-05-13 LAB — BASIC METABOLIC PANEL WITH GFR
Anion gap: 10 (ref 5–15)
BUN: 10 mg/dL (ref 8–23)
CO2: 22 mmol/L (ref 22–32)
Calcium: 9.1 mg/dL (ref 8.9–10.3)
Chloride: 105 mmol/L (ref 98–111)
Creatinine, Ser: 0.99 mg/dL (ref 0.61–1.24)
GFR, Estimated: >60 mL/min (ref >60)
Glucose, Bld: 188 mg/dL — ABNORMAL HIGH (ref 70–99)
Potassium: 3.5 mmol/L (ref 3.5–5.1)
Sodium: 137 mmol/L (ref 135–145)

## 2024-05-13 LAB — CBC
HCT: 34.7 % — ABNORMAL LOW (ref 39.0–52.0)
Hemoglobin: 11 g/dL — ABNORMAL LOW (ref 13.0–17.0)
MCH: 27.6 pg (ref 26.0–34.0)
MCHC: 31.7 g/dL (ref 30.0–36.0)
MCV: 87.2 fL (ref 80.0–100.0)
Platelets: 212 K/uL (ref 150–400)
RBC: 3.98 MIL/uL — ABNORMAL LOW (ref 4.22–5.81)
RDW: 13.1 % (ref 11.5–15.5)
WBC: 14.7 K/uL — ABNORMAL HIGH (ref 4.0–10.5)
nRBC: 0 % (ref 0.0–0.2)

## 2024-05-13 MED ORDER — DOCUSATE SODIUM 100 MG PO CAPS: 100.0000 mg | ORAL_CAPSULE | Freq: Two times a day (BID) | ORAL | 0 refills | Status: AC

## 2024-05-13 MED ORDER — OXYCODONE-ACETAMINOPHEN 5-325 MG PO TABS: 1.0000 | ORAL_TABLET | ORAL | 0 refills | Status: DC | PRN

## 2024-05-13 MED ORDER — TIZANIDINE HCL 2 MG PO TABS: 2.0000 mg | ORAL_TABLET | Freq: Three times a day (TID) | ORAL | 0 refills | Status: AC | PRN

## 2024-05-13 NOTE — Progress Notes (Signed)
 PT TX NOTE  05/13/24 1400  PT Visit Information  Last PT Received On 05/13/24  Assistance Needed Pt making good progress, meeting goals. Ready to d/c from PT standpoint with family assisiting prn.   History of Present Illness 73 yo male presents to therapy s/p R THA, anterior approach on 05/12/2024 due to failure of conservative measures. Pt PMH includes but is not limited to: anemia, arthritis, HLD, HTN, MI, sickle cell trait, and cardiac surgeries.  Subjective Data  Patient Stated Goal to be able to walk up tall and no pain  Precautions  Precautions Fall  Restrictions  Weight Bearing Restrictions Per Provider Order No  Pain Assessment  Pain Assessment 0-10  Pain Score 3  Pain Location R hip and LE  Pain Descriptors / Indicators Discomfort;Grimacing;Operative site guarding;Tightness  Pain Intervention(s) Limited activity within patient's tolerance;Monitored during session;Repositioned  Cognition  Arousal Alert  Behavior During Therapy WFL for tasks assessed/performed  PT - Cognitive impairments No apparent impairments  Following Commands  Following commands Intact  Communication  Communication No apparent difficulties  Bed Mobility  Overal bed mobility Needs Assistance  Bed Mobility Supine to Sit  Supine to sit Supervision;HOB elevated;Used rails  General bed mobility comments incr time, no physical assist  Transfers  Overall transfer level Needs assistance  Equipment used Rolling walker (2 wheels)  Transfers Sit to/from Stand  Sit to Stand Contact guard assist;Supervision  General transfer comment for safety. STS x3. pt self corrects for hand placement  Ambulation/Gait  Ambulation/Gait assistance Contact guard assist  Gait Distance (Feet) 140 Feet  Assistive device Rolling walker (2 wheels)  Gait Pattern/deviations Decreased stance time - right;Antalgic;Trunk flexed;Step-through pattern;Step-to pattern  General Gait Details pt demonstrates improved carryover from am session  - proper RW position, trunk extension as able and feet inside Rw with turns-much improved safety awareness  Gait velocity decreased  Stairs Yes  Stairs assistance Contact guard assist;Supervision  Stair Management One rail Left;Step to pattern;With cane;Forwards (pt states he has only 2-3 stairs to enter his dtr's home and he plans to d/c there today, sill stay on main level)  Number of Stairs  (2/3 x2)  General stair comments cues for sequence, no LOB, good stability; CGA to superv for safety  Balance  Overall balance assessment Needs assistance  Sitting-balance support Feet supported  Sitting balance-Leahy Scale Good  Standing balance support Bilateral upper extremity supported;During functional activity;Reliant on assistive device for balance  Standing balance-Leahy Scale Poor  Exercises  Exercises Total Joint  PT - End of Session  Equipment Utilized During Treatment Gait belt  Activity Tolerance Patient tolerated treatment well  Patient left with call bell/phone within reach;in bed;with nursing/sitter in room;with family/visitor present  Nurse Communication Mobility status   PT - Assessment/Plan  PT Visit Diagnosis Unsteadiness on feet (R26.81);Other abnormalities of gait and mobility (R26.89);Muscle weakness (generalized) (M62.81);Difficulty in walking, not elsewhere classified (R26.2);Pain  Pain - Right/Left Right  Pain - part of body Hip;Leg  PT Frequency (ACUTE ONLY) 7X/week  Follow Up Recommendations Follow physician's recommendations for discharge plan and follow up therapies  Patient can return home with the following A little help with walking and/or transfers;A little help with bathing/dressing/bathroom;Assistance with cooking/housework;Assist for transportation;Help with stairs or ramp for entrance  PT equipment Rolling walker (2 wheels)  AM-PAC PT 6 Clicks Mobility Outcome Measure (Version 2)  Help needed turning from your back to your side while in a flat bed without  using bedrails? 4  Help needed moving from lying  on your back to sitting on the side of a flat bed without using bedrails? 4  Help needed moving to and from a bed to a chair (including a wheelchair)? 3  Help needed standing up from a chair using your arms (e.g., wheelchair or bedside chair)? 3  Help needed to walk in hospital room? 3  Help needed climbing 3-5 steps with a railing?  3  6 Click Score 20  Consider Recommendation of Discharge To: Home with no services  Progressive Mobility  What is the highest level of mobility based on the mobility assessment? Level 4 (Ambulates with assistance) - Balance while stepping forward/back - Complete  PT Goal Progression  Progress towards PT goals Progressing toward goals  Acute Rehab PT Goals  PT Goal Formulation With patient  Time For Goal Achievement 05/26/24  Potential to Achieve Goals Good  PT Time Calculation  PT Start Time (ACUTE ONLY) 1408  PT Stop Time (ACUTE ONLY) 1430  PT Time Calculation (min) (ACUTE ONLY) 22 min  PT General Charges  $$ ACUTE PT VISIT 1 Visit  PT Treatments  $Gait Training 8-22 mins

## 2024-05-13 NOTE — TOC Transition Note (Addendum)
 Transition of Care [facility]) - Discharge Note   Patient Details  Name: [name] [name] MRN: [id number] Date of Birth: 04/13/1951  Transition of Care [facility]) CM/SW Contact:  Alfonse JONELLE Rex, RN Phone Number: 05/13/2024, 10:01 AM   Clinical Narrative:   Met with patient at bedside to review dc therapy and home equipment needs, RW ordered through [employer], patient confirmed HH PT, did not know name of HH agency, text sent to Ortho PA to confirmed [facility] agency. Patient states he will discharge to his daughters home at: [address] [place], [place], [place] [address], NCM will notify [facility] agency once confirmed MOON completed.   -10:20am Confirmed HH agency [facility], text sent to [facility], rep-Artavia , informing patient to dc to his  daughters address noted above.     Final next level of care: Home w Home Health Services Barriers to Discharge: No Barriers Identified   Patient Goals and CMS Choice Patient states their goals for this hospitalization and ongoing recovery are:: return home          Discharge Placement                       Discharge Plan and Services Additional resources added to the After Visit Summary for                                       Social Drivers of Health (SDOH) Interventions SDOH Screenings   Food Insecurity: Food Insecurity Present (05/12/2024)  Housing: High Risk (05/12/2024)  Transportation Needs: Unmet Transportation Needs (05/12/2024)  Utilities: At Risk (05/12/2024)  Social Connections: Unknown (05/12/2024)  Tobacco Use: Medium Risk (05/12/2024)     Readmission Risk Interventions     No data to display

## 2024-05-13 NOTE — Progress Notes (Signed)
 Pt complained of chest pain rated 4/10, starting from the left jaw and radiating to the left arm. Vital signs were obtained, and PRN nitroglycerin  spray was administered. Pt reported that the chest pain improved but did not completely resolve, with a current pain score of 3/10. On-call provider was notified, and an EKG was obtained.

## 2024-05-13 NOTE — Anesthesia Postprocedure Evaluation (Signed)
 Anesthesia Post Note  Patient: [name] [name]  Procedure(s) Performed: ARTHROPLASTY, HIP, TOTAL, ANTERIOR APPROACH (Right: Hip)     Patient location during evaluation: PACU Anesthesia Type: Spinal Level of consciousness: awake and alert Pain management: pain level controlled Vital Signs Assessment: post-procedure vital signs reviewed and stable Respiratory status: spontaneous breathing, nonlabored ventilation and respiratory function stable Cardiovascular status: blood pressure returned to baseline and stable Postop Assessment: no apparent nausea or vomiting Anesthetic complications: no   No notable events documented.  Last Vitals:  Vitals:   05/13/24 0806 05/13/24 0808  BP:    Pulse:    Resp:    Temp:    SpO2: 99% 99%    Last Pain:  Vitals:   05/13/24 0819  TempSrc:   PainSc: 4                  Butler Levander Pinal

## 2024-05-13 NOTE — Discharge Summary (Signed)
 Patient ID: [name] [name] MRN: [id number] DOB/AGE: 1951-01-20 73 y.o.  Admit date: 05/12/2024 Discharge date: 05/13/2024  Admission Diagnoses:  Principal Problem:   Primary osteoarthritis of right hip   Discharge Diagnoses:  Same  Past Medical History:  Diagnosis Date   Anemia    Arthritis    Cataracts, bilateral    Coronary artery disease    Hyperlipidemia    Hypertension    Myocardial infarction (HCC)    Seasonal allergies    Sickle cell trait (HCC)     Surgeries: Procedure(s): Right ARTHROPLASTY, HIP, TOTAL, ANTERIOR APPROACH on 05/12/2024   Consultants:   Discharged Condition: Improved  Hospital Course: [name] [name] is an 73 y.o. male who was admitted 05/12/2024 for operative treatment ofPrimary osteoarthritis of right hip. Patient has severe unremitting pain that affects sleep, daily activities, and work/hobbies. After pre-op clearance the patient was taken to the operating room on 05/12/2024 and underwent  Procedure(s): Right ARTHROPLASTY, HIP, TOTAL, ANTERIOR APPROACH.    Patient was given perioperative antibiotics:  Anti-infectives (From admission, onward)    Start     Dose/Rate Route Frequency Ordered Stop   05/12/24 1600  ceFAZolin  (ANCEF ) IVPB 2g/100 mL premix        2 g 200 mL/hr over 30 Minutes Intravenous Every 6 hours 05/12/24 1525 05/12/24 2224   05/12/24 0800  ceFAZolin  (ANCEF ) IVPB 2g/100 mL premix        2 g 200 mL/hr over 30 Minutes Intravenous On call to O.R. 05/12/24 0750 05/12/24 1043        Patient was given sequential compression devices, early ambulation, and chemoprophylaxis to prevent DVT.  Inpatient Morphine  Milligram Equivalents Per Day 8/25 - 8/26   Values displayed are in units of MME/Day    Order Start / End Date Yesterday Today    oxyCODONE  (Oxy IR/ROXICODONE ) immediate release tablet 5 mg 8/25 - 8/25 7.5 of Unknown --    oxyCODONE  (ROXICODONE ) 5 MG/5ML solution 5 mg 8/25 - 8/25 0 of Unknown --      Group total: 7.5 of  Unknown     HYDROmorphone  (DILAUDID ) injection 0.25-0.5 mg 8/25 - 8/25 30 of 40-80 --    fentaNYL  (SUBLIMAZE ) injection 8/25 - 8/25 *22.5 of 22.5 --    HYDROmorphone  (DILAUDID ) injection 0.5-1 mg 8/25 - No end date 0 of 30-60 0 of 60-120    oxyCODONE  (Oxy IR/ROXICODONE ) immediate release tablet 5-10 mg 8/25 - No end date 22.5 of 22.5-45 15 of 45-90    Daily Totals  * 82.5 of Unknown (at least 115-207.5) 15 of 105-210  *One-Step medication  Calculation Errors     Order Type Date Details   oxyCODONE  (Oxy IR/ROXICODONE ) immediate release tablet 5 mg Ordered Dose -- Insufficient frequency information   oxyCODONE  (ROXICODONE ) 5 MG/5ML solution 5 mg Ordered Dose -- Insufficient frequency information            Patient benefited maximally from hospital stay and there were no complications.    Recent vital signs: Patient Vitals for the past 24 hrs:  BP Temp Temp src Pulse Resp SpO2  05/13/24 0808 -- -- -- -- -- 99 %  05/13/24 0806 -- -- -- -- -- 99 %  05/13/24 0557 114/67 -- -- 72 -- --  05/13/24 0548 129/68 -- -- 66 -- --  05/13/24 0513 136/70 -- -- 75 -- --  05/13/24 0502 116/66 -- -- 78 -- --  05/13/24 0457 (!) 144/78 97.7 F (36.5 C) Oral 67 18 97 %  05/13/24 0159 132/81 98.9 F (37.2 C) Oral 75 18 93 %  05/12/24 2058 134/82 97.8 F (36.6 C) Oral 65 18 98 %  05/12/24 1742 118/72 97.6 F (36.4 C) Oral 64 18 99 %  05/12/24 1523 102/68 -- -- 61 -- 100 %  05/12/24 1430 115/68 -- -- (!) 58 15 100 %  05/12/24 1415 100/62 -- -- (!) 57 14 100 %  05/12/24 1400 110/71 -- -- (!) 57 11 99 %  05/12/24 1345 108/70 -- -- (!) 57 11 97 %  05/12/24 1340 106/73 -- -- 62 11 94 %  05/12/24 1335 102/68 -- -- (!) 53 17 95 %  05/12/24 1330 103/69 -- -- (!) 54 16 97 %  05/12/24 1325 99/67 -- -- (!) 52 14 100 %  05/12/24 1320 108/72 -- -- (!) 55 12 99 %  05/12/24 1315 106/60 -- -- (!) 57 13 100 %  05/12/24 1310 94/63 -- -- (!) 57 13 99 %  05/12/24 1305 91/65 -- -- 62 15 97 %  05/12/24 1300 (!)  79/58 -- -- 66 (!) 21 96 %  05/12/24 1259 (!) 79/58 -- -- 63 (!) 25 97 %  05/12/24 1255 (!) 89/65 -- -- 60 (!) 25 96 %  05/12/24 1250 (!) 88/64 -- -- 61 16 97 %  05/12/24 1245 102/72 -- -- (!) 59 16 96 %  05/12/24 1240 100/62 -- -- 63 17 92 %  05/12/24 1233 90/63 (!) 97 F (36.1 C) -- (!) 57 19 96 %     Recent laboratory studies:  Recent Labs    05/13/24 0342  WBC 14.7*  HGB 11.0*  HCT 34.7*  PLT 212  NA 137  K 3.5  CL 105  CO2 22  BUN 10  CREATININE 0.99  GLUCOSE 188*  CALCIUM  9.1     Discharge Medications:   Allergies as of 05/13/2024       Reactions   Shellfish-derived Products Anaphylaxis, Shortness Of Breath, Other (See Comments)   My throat goes numb   Pork-derived Products    Does not eat, not allergic        Medication List     STOP taking these medications    aspirin  81 MG chewable tablet   clopidogrel  75 MG tablet Commonly known as: Plavix    oxyCODONE  5 MG immediate release tablet Commonly known as: Oxy IR/ROXICODONE        TAKE these medications    amLODipine  10 MG tablet Commonly known as: NORVASC  Take 1 tablet (10 mg total) by mouth daily.   atorvastatin  80 MG tablet Commonly known as: LIPITOR  Take 1 tablet (80 mg total) by mouth daily. What changed: how much to take   chlorhexidine  0.12 % solution Commonly known as: PERIDEX  Use as directed 10 mLs in the mouth or throat 2 (two) times daily.   docusate sodium  100 MG capsule Commonly known as: Colace Take 1 capsule (100 mg total) by mouth 2 (two) times daily.   finasteride  5 MG tablet Commonly known as: PROSCAR  Take 5 mg by mouth daily.   furosemide  20 MG tablet Commonly known as: LASIX  Take 40 mg by mouth daily.   Fusion Plus Caps Take 1 capsule by mouth daily.   isosorbide  mononitrate 30 MG 24 hr tablet Commonly known as: IMDUR  Take 30 mg by mouth daily.   metoprolol  succinate 50 MG 24 hr tablet Commonly known as: TOPROL -XL Take 50 mg by mouth daily. Take with  or immediately following a meal.  nitroGLYCERIN  0.4 MG/SPRAY spray Commonly known as: NITROLINGUAL  Place 1 spray under the tongue every 5 (five) minutes as needed for chest pain.   oxyCODONE -acetaminophen  5-325 MG tablet Commonly known as: PERCOCET/ROXICET Take 1 tablet by mouth every 4 (four) hours as needed for severe pain (pain score 7-10).   pantoprazole  40 MG tablet Commonly known as: PROTONIX  Take 1 tablet (40 mg total) by mouth daily at 6 (six) AM.   Potassium Chloride  ER 20 MEQ Tbcr Take 20 mEq by mouth daily.   pramipexole  0.125 MG tablet Commonly known as: MIRAPEX  Take 0.125 mg by mouth at bedtime.   ranolazine  500 MG 12 hr tablet Commonly known as: RANEXA  Take 500 mg by mouth 2 (two) times daily.   tamsulosin  0.4 MG Caps capsule Commonly known as: FLOMAX  Take 0.8 mg by mouth at bedtime.   tiotropium 18 MCG inhalation capsule Commonly known as: SPIRIVA Place 18 mcg into inhaler and inhale daily.   tiZANidine  2 MG tablet Commonly known as: ZANAFLEX  Take 1 tablet (2 mg total) by mouth every 8 (eight) hours as needed for muscle spasms.   Voltaren  Arthritis Pain 1 % Gel Generic drug: diclofenac  Sodium Apply 2 g topically daily as needed (for pain).   Xarelto  10 MG Tabs tablet Generic drug: rivaroxaban  Take 1 tablet (10 mg total) by mouth daily.               Durable Medical Equipment  (From admission, onward)           Start     Ordered   05/12/24 1526  DME Walker rolling  Once       Question:  Patient needs a walker to treat with the following condition  Answer:  Primary osteoarthritis of right hip   05/12/24 1525   05/12/24 1526  DME 3 n 1  Once        05/12/24 1525            Diagnostic Studies: DG HIP UNILAT WITH PELVIS 1V RIGHT Result Date: 05/12/2024 CLINICAL DATA:  Elective surgery. EXAM: DG HIP (WITH OR WITHOUT PELVIS) 1V RIGHT COMPARISON:  None Available. FINDINGS: Two fluoroscopic spot views of the pelvis and right hip  obtained in the operating room. Images during hip arthroplasty. Fluoroscopy time 15 seconds. Dose 2 mGy. IMPRESSION: Intraoperative fluoroscopy during right hip arthroplasty. Electronically Signed   By: Andrea Gasman M.D.   On: 05/12/2024 14:29   DG C-Arm 1-60 Min-No Report Result Date: 05/12/2024 Fluoroscopy was utilized by the requesting physician.  No radiographic interpretation.   DG C-Arm 1-60 Min-No Report Result Date: 05/12/2024 Fluoroscopy was utilized by the requesting physician.  No radiographic interpretation.    Disposition: Discharge disposition: 01-Home or Self Care       Discharge Instructions     Call MD / Call 911   Complete by: As directed    If you experience chest pain or shortness of breath, CALL 911 and be transported to the hospital emergency room.  If you develope a fever above 101 F, pus (white drainage) or increased drainage or redness at the wound, or calf pain, call your surgeon's office.   Constipation Prevention   Complete by: As directed    Drink plenty of fluids.  Prune juice may be helpful.  You may use a stool softener, such as Colace (over the counter) 100 mg twice a day.  Use MiraLax  (over the counter) for constipation as needed.   Diet general   Complete by: As  directed    Increase activity slowly as tolerated   Complete by: As directed    Post-operative opioid taper instructions:   Complete by: As directed    POST-OPERATIVE OPIOID TAPER INSTRUCTIONS: It is important to wean off of your opioid medication as soon as possible. If you do not need pain medication after your surgery it is ok to stop day one. Opioids include: Codeine, Hydrocodone(Norco, Vicodin), Oxycodone (Percocet, oxycontin ) and hydromorphone  amongst others.  Long term and even short term use of opiods can cause: Increased pain response Dependence Constipation Depression Respiratory depression And more.  Withdrawal symptoms can include Flu like symptoms Nausea,  vomiting And more Techniques to manage these symptoms Hydrate well Eat regular healthy meals Stay active Use relaxation techniques(deep breathing, meditating, yoga) Do Not substitute Alcohol to help with tapering If you have been on opioids for less than two weeks and do not have pain than it is ok to stop all together.  Plan to wean off of opioids This plan should start within one week post op of your joint replacement. Maintain the same interval or time between taking each dose and first decrease the dose.  Cut the total daily intake of opioids by one tablet each day Next start to increase the time between doses. The last dose that should be eliminated is the evening dose.      TED hose   Complete by: As directed    Use stockings (TED hose) for 2 weeks on both leg(s).  You may remove them at night for sleeping.        Follow-up Information     Yvone Rush, MD. Schedule an appointment as soon as possible for a visit in 2 week(s).   Specialty: Orthopedic Surgery Contact information: [address] [place] [address] [phone]                  Signed: Lynwood KANDICE Reining 05/13/2024, 8:38 AM

## 2024-05-13 NOTE — Progress Notes (Signed)
 Physical Therapy Treatment Patient Details Name: [name] [name] MRN: [id number] DOB: 12/27/1950 Today's Date: 05/13/2024   History of Present Illness 73 yo male presents to therapy s/p R THA, anterior approach on 05/12/2024 due to failure of conservative measures. Pt PMH includes but is not limited to: anemia, arthritis, HLD, HTN, MI, sickle cell trait, and cardiac surgeries.    PT Comments  Pt progressing well this session. Continues to require CGA - supervision and cues for safety and RW management. Will see again in pm and pt will likely be ready to d/c after pm session.    If plan is discharge home, recommend the following: A little help with walking and/or transfers;A little help with bathing/dressing/bathroom;Assistance with cooking/housework;Assist for transportation;Help with stairs or ramp for entrance   Can travel by private vehicle        Equipment Recommendations  Rolling walker (2 wheels)    Recommendations for Other Services       Precautions / Restrictions Precautions Precautions: Fall Restrictions Weight Bearing Restrictions Per Provider Order: No     Mobility  Bed Mobility Overal bed mobility: Needs Assistance Bed Mobility: Supine to Sit, Sit to Supine     Supine to sit: Supervision, HOB elevated, Used rails Sit to supine: Contact guard assist   General bed mobility comments: cues for use of leg lifter to self assist    Transfers Overall transfer level: Needs assistance Equipment used: Rolling walker (2 wheels) Transfers: Sit to/from Stand Sit to Stand: Contact guard assist           General transfer comment: pt able to push to stand from EOB to RW min cues for hand placement    Ambulation/Gait Ambulation/Gait assistance: Contact guard assist Gait Distance (Feet): 130 Feet Assistive device: Rolling walker (2 wheels) Gait Pattern/deviations: Decreased stance time - right, Antalgic, Trunk flexed, Step-through pattern, Step-to pattern Gait  velocity: decreased     General Gait Details: pt reports trunk flexion at baseline and required frequent cues for extension posture as able, cues for safety, RW management and  to keep feet inside RW especially with turns   Optometrist     Tilt Bed    Modified Rankin (Stroke Patients Only)       Balance Overall balance assessment: Needs assistance Sitting-balance support: Feet supported Sitting balance-Leahy Scale: Good     Standing balance support: Bilateral upper extremity supported, During functional activity, Reliant on assistive device for balance Standing balance-Leahy Scale: Poor                              Communication Communication Communication: No apparent difficulties  Cognition Arousal: Alert Behavior During Therapy: WFL for tasks assessed/performed   PT - Cognitive impairments: No apparent impairments                         Following commands: Intact      Cueing    Exercises Total Joint Exercises Ankle Circles/Pumps: AROM, Both, 10 reps    General Comments        Pertinent Vitals/Pain Pain Assessment Pain Assessment: 0-10 Pain Score: 5  Pain Location: R hip and LE Pain Descriptors / Indicators: Aching, Discomfort, Dull, Grimacing, Operative site guarding Pain Intervention(s): Limited activity within patient's tolerance, Monitored during session, Repositioned, Ice applied    Home Living  Prior Function            PT Goals (current goals can now be found in the care plan section) Acute Rehab PT Goals Patient Stated Goal: to be able to walk up tall and no pain PT Goal Formulation: With patient Time For Goal Achievement: 05/26/24 Potential to Achieve Goals: Good Progress towards PT goals: Progressing toward goals    Frequency    7X/week      PT Plan      Co-evaluation              AM-PAC PT 6 Clicks Mobility   Outcome  Measure  Help needed turning from your back to your side while in a flat bed without using bedrails?: None Help needed moving from lying on your back to sitting on the side of a flat bed without using bedrails?: A Little Help needed moving to and from a bed to a chair (including a wheelchair)?: A Little Help needed standing up from a chair using your arms (e.g., wheelchair or bedside chair)?: A Little Help needed to walk in hospital room?: A Little Help needed climbing 3-5 steps with a railing? : A Lot 6 Click Score: 18    End of Session Equipment Utilized During Treatment: Gait belt Activity Tolerance: Patient tolerated treatment well Patient left: with call bell/phone within reach;in bed;with bed alarm set Nurse Communication: Mobility status PT Visit Diagnosis: Unsteadiness on feet (R26.81);Other abnormalities of gait and mobility (R26.89);Muscle weakness (generalized) (M62.81);Difficulty in walking, not elsewhere classified (R26.2);Pain Pain - Right/Left: Right Pain - part of body: Hip;Leg     Time: 8978-8957 PT Time Calculation (min) (ACUTE ONLY): 21 min  Charges:    $Gait Training: 8-22 mins PT General Charges $$ ACUTE PT VISIT: 1 Visit                     Olie Scaffidi, PT  Acute Rehab Dept [facility]) [phone]  05/13/2024    [facility] 05/13/2024, 10:49 AM

## 2024-05-13 NOTE — Care Management Obs Status (Signed)
 MEDICARE OBSERVATION STATUS NOTIFICATION   Patient Details  Name: TOMA ARTS MRN: [id number] Date of Birth: 12/03/1950   Medicare Observation Status Notification Given:  Yes    Alfonse JONELLE Rex, RN 05/13/2024, 9:35 AM

## 2024-05-13 NOTE — Progress Notes (Signed)
 Subjective: 1 Day Post-Op Procedure(s) (LRB): ARTHROPLASTY, HIP, TOTAL, ANTERIOR APPROACH (Right) Patient reports pain as mild.  Patient had some mild chest pain last night.  Was given sublingual nitroglycerin  and did fine.  EKG was without abnormalities. He states he feels well this morning.  He is taking by mouth and voiding okay.  Objective: Vital signs in last 24 hours: Temp:  [97 F (36.1 C)-98.9 F (37.2 C)] 97.7 F (36.5 C) (08/26 0457) Pulse Rate:  [52-78] 72 (08/26 0557) Resp:  [11-25] 18 (08/26 0457) BP: (79-144)/(58-82) 114/67 (08/26 0557) SpO2:  [92 %-100 %] 99 % (08/26 0808)  Intake/Output from previous day: 08/25 0701 - 08/26 0700 In: 2244 [P.O.:480; I.V.:1115; IV Piggyback:649] Out: 4315 [Urine:4065; Blood:250] Intake/Output this shift: No intake/output data recorded.  Recent Labs    05/13/24 0342  HGB 11.0*   Recent Labs    05/13/24 0342  WBC 14.7*  RBC 3.98*  HCT 34.7*  PLT 212   Recent Labs    05/13/24 0342  NA 137  K 3.5  CL 105  CO2 22  BUN 10  CREATININE 0.99  GLUCOSE 188*  CALCIUM  9.1   No results for input(s): LABPT, INR in the last 72 hours. Right hip exam: Neurovascular intact Sensation intact distally Intact pulses distally Dorsiflexion/Plantar flexion intact Incision: dressing C/D/I No cellulitis present Compartment soft   Assessment/Plan: 1 Day Post-Op Procedure(s) (LRB): ARTHROPLASTY, HIP, TOTAL, ANTERIOR APPROACH (Right) Plan: Up with therapy weight-bear as tolerated on the right without hip precautions. We will resume his preoperative Xarelto  today he was given a dose this morning. Will need home health physical therapy. Follow-up with Dr. Yvone in 2 weeks.   Lynwood KANDICE Reining 05/13/2024, 8:31 AM

## 2024-07-10 ENCOUNTER — Other Ambulatory Visit: Payer: Self-pay

## 2024-07-10 DIAGNOSIS — I6522 Occlusion and stenosis of left carotid artery: Secondary | ICD-10-CM

## 2024-07-30 ENCOUNTER — Other Ambulatory Visit: Payer: Self-pay | Admitting: Internal Medicine

## 2024-07-30 DIAGNOSIS — Z122 Encounter for screening for malignant neoplasm of respiratory organs: Secondary | ICD-10-CM

## 2024-08-07 ENCOUNTER — Encounter: Payer: Self-pay | Admitting: Vascular Surgery

## 2024-08-07 ENCOUNTER — Ambulatory Visit (HOSPITAL_COMMUNITY)
Admission: RE | Admit: 2024-08-07 | Discharge: 2024-08-07 | Disposition: A | Discharge status: Home / Self Care | Source: Ambulatory Visit | Attending: Vascular Surgery | Admitting: Vascular Surgery

## 2024-08-07 ENCOUNTER — Ambulatory Visit: Admitting: Vascular Surgery

## 2024-08-07 VITALS — BP 125/79 | HR 70 | Temp 97.6°F | Resp 18 | Ht 71.0 in | Wt 184.0 lb

## 2024-08-07 DIAGNOSIS — I6522 Occlusion and stenosis of left carotid artery: Secondary | ICD-10-CM

## 2024-08-07 NOTE — Progress Notes (Signed)
 Office Note     HPI: [name] [name] is a 73 y.o. (Nov 25, 1950) male presenting in follow-up with known left common carotid artery occlusion with patent bulb, and antegrade ICA flow from external carotid artery collaterals.  Right side demonstrates mild disease.  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  Czar has CAD with 6 coronary stents. In July of 2024 he presented to [facility] ED with concern for acute aortic syndrome.  CT angio chest abdomen pelvis followed demonstrating incidental finding of left-sided common carotid artery occlusion.  The carotid was widely patent and April.  At his last visit, CT angio head and neck were ordered, and patent internal carotid artery appreciated from external carotid collaterals.  On exam, Khup was doing well.  Recently saw his cardiologist who said his heart looked great.  Continues to have atypical chest pain which he takes nitro every morning for.  Recently had his right hip replaced, and wants to get his left hip replaced.  Denies symptoms of TIA, amaurosis, stroke.  Notes intermittent numbness along the left jaw which happens a few times a week.  Timing is usually concurrent with his chest pain.  Denies facial weakness.   Past Medical History:  Diagnosis Date   Anemia    Arthritis    Cataracts, bilateral    Coronary artery disease    Hyperlipidemia    Hypertension    Myocardial infarction (HCC)    Seasonal allergies    Sickle cell trait     Past Surgical History:  Procedure Laterality Date   APPENDECTOMY     KNEE ARTHROSCOPY WITH ANTERIOR CRUCIATE LIGAMENT (ACL) REPAIR Left    LEFT HEART CATH AND CORONARY ANGIOGRAPHY N/A 05/11/2020   Procedure: LEFT HEART CATH AND CORONARY ANGIOGRAPHY;  Surgeon: Levern Hutching, MD;  Location: [facility] INVASIVE [facility];  Service: Cardiovascular;  Laterality: N/A;   LEFT HEART CATH AND CORONARY ANGIOGRAPHY N/A 11/16/2022   Procedure: LEFT HEART CATH AND CORONARY ANGIOGRAPHY;   Surgeon: Ladona Heinz, MD;  Location: [facility] INVASIVE CV LAB;  Service: Cardiovascular;  Laterality: N/A;  FIrst case, 7:30 AM   STENT PLACEMENT VASCULAR ([facility] HX)     TOTAL HIP ARTHROPLASTY Right 05/12/2024   Procedure: ARTHROPLASTY, HIP, TOTAL, ANTERIOR APPROACH;  Surgeon: Yvone Rush, MD;  Location: [facility] ORS;  Service: Orthopedics;  Laterality: Right;    Social History   Socioeconomic History   Marital status: Legally Separated    Spouse name: Not on file   Number of children: Not on file   Years of education: Not on file   Highest education level: Not on file  Occupational History   Occupation: RN at [employer]  Tobacco Use   Smoking status: Former    Types: Cigarettes   Smokeless tobacco: Former    Types: Chew    Quit date: 09/18/1970  Vaping Use   Vaping status: Never Used  Substance and Sexual Activity   Alcohol use: Not Currently   Drug use: No   Sexual activity: Never  Other Topics Concern   Not on file  Social History Narrative   Social History      Diet? regular      Do you drink/eat things with caffeine?      Marital status?               separated  What year were you married? 1982      Do you live in a house, apartment, assisted living, condo, trailer, etc.?  (house circled on new patient packet)      Is it one or more stories? yes      How many persons live in your home? 1      Do you have any pets in your home? (please list) no      Highest level of education completed?      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         no                             Type & how often?      Do you have a living will? Yes      Do you have a DNR form?      Yes                            If not, do you want to discuss one?      Do you have signed POA/HPOA for forms? yes      Functional Status      Do you have difficulty bathing or dressing yourself? no      Do you have difficulty  preparing food or eating?  no      Do you have difficulty managing your medications? no      Do you have difficulty managing your finances? no      Do you have difficulty affording your medications? no   Social Drivers of Corporate Investment Banker Strain: Not on file  Food Insecurity: Food Insecurity Present (05/12/2024)   Hunger Vital Sign    Worried About Running Out of Food in the Last Year: Sometimes true    Ran Out of Food in the Last Year: Sometimes true  Transportation Needs: Unmet Transportation Needs (05/12/2024)   PRAPARE - Administrator, Civil Service (Medical): Yes    Lack of Transportation (Non-Medical): Yes  Physical Activity: Not on file  Stress: Not on file  Social Connections: Unknown (05/12/2024)   Social Connection and Isolation Panel    Frequency of Communication with Friends and Family: Once a week    Frequency of Social Gatherings with Friends and Family: Once a week    Attends Religious Services: 1 to 4 times per year    Active Member of [employer] or Organizations: No    Attends Banker Meetings: 1 to 4 times per year    Marital Status: Patient declined  Intimate Partner Violence: Not At Risk (05/12/2024)   Humiliation, Afraid, Rape, and Kick questionnaire    Fear of Current or Ex-Partner: No    Emotionally Abused: No    Physically Abused: No    Sexually Abused: No   Family History  Problem Relation Age of Onset   Colon cancer Neg Hx    Esophageal cancer Neg Hx    Rectal cancer Neg Hx    Stomach cancer Neg Hx     Current Outpatient Medications  Medication Sig Dispense Refill   amLODipine  (NORVASC ) 10 MG tablet Take 1 tablet (10 mg total) by mouth daily. 90 tablet 0   atorvastatin  (LIPITOR ) 80 MG tablet Take 1 tablet (  80 mg total) by mouth daily. (Patient taking differently: Take 20 mg by mouth daily.) 30 tablet 3   chlorhexidine  (PERIDEX ) 0.12 % solution Use as directed 10 mLs in the mouth or throat 2 (two) times daily.      diclofenac  Sodium (VOLTAREN  ARTHRITIS PAIN) 1 % GEL Apply 2 g topically daily as needed (for pain).     docusate sodium  (COLACE) 100 MG capsule Take 1 capsule (100 mg total) by mouth 2 (two) times daily. 30 capsule 0   finasteride  (PROSCAR ) 5 MG tablet Take 5 mg by mouth daily.     furosemide  (LASIX ) 20 MG tablet Take 40 mg by mouth daily.     Iron -FA-B Cmp-C-Biot-Probiotic (FUSION PLUS) CAPS Take 1 capsule by mouth daily.     isosorbide  mononitrate (IMDUR ) 30 MG 24 hr tablet Take 30 mg by mouth daily.     metoprolol  succinate (TOPROL -XL) 50 MG 24 hr tablet Take 50 mg by mouth daily. Take with or immediately following a meal.     nitroGLYCERIN  (NITROLINGUAL ) 0.4 MG/SPRAY spray Place 1 spray under the tongue every 5 (five) minutes as needed for chest pain.      oxyCODONE -acetaminophen  (PERCOCET/ROXICET) 5-325 MG tablet Take 1 tablet by mouth every 4 (four) hours as needed for severe pain (pain score 7-10). 30 tablet 0   pantoprazole  (PROTONIX ) 40 MG tablet Take 1 tablet (40 mg total) by mouth daily at 6 (six) AM. 30 tablet 1   Potassium Chloride  ER 20 MEQ TBCR Take 20 mEq by mouth daily.     pramipexole  (MIRAPEX ) 0.125 MG tablet Take 0.125 mg by mouth at bedtime.     ranolazine  (RANEXA ) 500 MG 12 hr tablet Take 500 mg by mouth 2 (two) times daily.     rivaroxaban  (XARELTO ) 10 MG TABS tablet Take 1 tablet (10 mg total) by mouth daily. 30 tablet 3   tamsulosin  (FLOMAX ) 0.4 MG CAPS capsule Take 0.8 mg by mouth at bedtime.   1   tiotropium (SPIRIVA) 18 MCG inhalation capsule Place 18 mcg into inhaler and inhale daily.     tiZANidine  (ZANAFLEX ) 2 MG tablet Take 1 tablet (2 mg total) by mouth every 8 (eight) hours as needed for muscle spasms. 40 tablet 0   No current facility-administered medications for this visit.    Allergies  Allergen Reactions   Shellfish Protein-Containing Drug Products Anaphylaxis, Shortness Of Breath and Other (See Comments)    My throat goes numb   Porcine (Pork)  Protein-Containing Drug Products     Does not eat, not allergic     REVIEW OF SYSTEMS:  [X]  denotes positive finding, [ ]  denotes negative finding Cardiac  Comments:  Chest pain or chest pressure:    Shortness of breath upon exertion:    Short of breath when lying flat:    Irregular heart rhythm:        Vascular    Pain in calf, thigh, or hip brought on by ambulation:    Pain in feet at night that wakes you up from your sleep:     Blood clot in your veins:    Leg swelling:         Pulmonary    Oxygen at home:    Productive cough:     Wheezing:         Neurologic    Sudden weakness in arms or legs:     Sudden numbness in arms or legs:     Sudden onset of difficulty speaking or  slurred speech:    Temporary loss of vision in one eye:     Problems with dizziness:         Gastrointestinal    Blood in stool:     Vomited blood:         Genitourinary    Burning when urinating:     Blood in urine:        Psychiatric    Major depression:         Hematologic    Bleeding problems:    Problems with blood clotting too easily:        Skin    Rashes or ulcers:        Constitutional    Fever or chills:      PHYSICAL EXAMINATION:  There were no vitals filed for this visit.  General:  WDWN in NAD; vital signs documented above Gait: Not observed HENT: WNL, normocephalic Pulmonary: normal non-labored breathing , without wheezing Cardiac: regular HR, complaint of acute, intermittent episodes of chest pain which resolved in seconds Abdomen: soft, NT, no masses Skin: without rashes Vascular Exam/Pulses:  Right Left  Radial 2+ (normal) 2+ (normal)  Ulnar    Femoral    Popliteal    DP 2+ (normal) 2+ (normal)  PT     Extremities: without ischemic changes, without Gangrene , without cellulitis; without open wounds;  Musculoskeletal: no muscle wasting or atrophy  Neurologic: A&O X 3;  No focal weakness or paresthesias are detected Psychiatric:  The pt has Normal  affect.   Non-Invasive Vascular Imaging:    Right carotid system: No evidence of dissection, stenosis (50% or greater), or occlusion. Mild calcified plaque along the right carotid bulb and proximal right cervical ICA.   Left carotid system: Complete occlusion of the proximal left common carotid artery with partial reconstitution of the left ICA at the carotid bulb, likely via collaterals from the left external carotid artery.  Right Carotid Findings:  +----------+--------+--------+--------+------------------+-----------------  -+           PSV cm/sEDV cm/sStenosisPlaque DescriptionComments             +----------+--------+--------+--------+------------------+-----------------  -+  CCA Prox  94      28                                intimal  thickening  +----------+--------+--------+--------+------------------+-----------------  -+  CCA Distal67      23                                intimal  thickening  +----------+--------+--------+--------+------------------+-----------------  -+  ICA Prox  80      20      1-39%   heterogenous                           +----------+--------+--------+--------+------------------+-----------------  -+  ICA Mid   66      17                                                     +----------+--------+--------+--------+------------------+-----------------  -+  ICA Distal87      32                                                     +----------+--------+--------+--------+------------------+-----------------  -+  ECA      103     18                                                     +----------+--------+--------+--------+------------------+-----------------  -+   +----------+--------+-------+----------------+-------------------+           PSV cm/sEDV cmsDescribe        Arm Pressure (mmHG)  +----------+--------+-------+----------------+-------------------+  [id number]     7       Multiphasic, [id number]                  +----------+--------+-------+----------------+-------------------+   +---------+--------+--+--------+--+---------+  VertebralPSV cm/s58EDV cm/s12Antegrade  +---------+--------+--+--------+--+---------+     Left Carotid Findings:  +----------+--------+--------+--------+------------------+--------+           PSV cm/sEDV cm/sStenosisPlaque DescriptionComments  +----------+--------+--------+--------+------------------+--------+  CCA Prox                  Occluded                            +----------+--------+--------+--------+------------------+--------+  CCA Mid                   Occluded                            +----------+--------+--------+--------+------------------+--------+  CCA Distal                Occluded                            +----------+--------+--------+--------+------------------+--------+  ICA Prox  15      0                                           +----------+--------+--------+--------+------------------+--------+  ICA Mid   34      6                                           +----------+--------+--------+--------+------------------+--------+  ICA Distal31      3                                           +----------+--------+--------+--------+------------------+--------+  ECA                      Occludedcalcific                    +----------+--------+--------+--------+------------------+--------+   +----------+--------+--------+---------+-------------------+           PSV cm/sEDV cm/sDescribe Arm Pressure (mmHG)  +----------+--------+--------+---------+-------------------+  Subclavian208    0       [id number]                  +----------+--------+--------+---------+-------------------+   +---------+--------+--+--------+--+---------+  VertebralPSV cm/s79EDV cm/s25Antegrade  +---------+--------+--+--------+--+---------+    ASSESSMENT/PLAN: Elsie DELENA Rubinstein is a 73 y.o. male presenting with asymptomatic occlusion of the left common carotid artery with patent internal carotid  artery from external branches.  Right carotid with minimal disease.  Tamel has had no significant changes on carotid duplex ultrasound when compared to last year.  He remains asymptomatic. The numb sensation with him appreciates in the left jaw runs along the mandibular branch of the facial nerve.  No weakness.  Symptoms inconsistent with stroke.  I am unsure what to make of this.  I offered him a referral to ENT for evaluation.  He declined.  We discussed that the face is well-vascularized, and I do not think that episodes are consistent with arterial insufficiency, I would expect jaw claudication. I asked him to continue his current medication regimen.  We discussed the signs and symptoms of stroke, TIA, amaurosis, advised him to call 911 immediately should these occur.   Fonda FORBES Rim, MD [facility] [phone]

## 2024-08-08 ENCOUNTER — Inpatient Hospital Stay: Admission: RE | Admit: 2024-08-08 | Source: Ambulatory Visit

## 2024-08-18 ENCOUNTER — Other Ambulatory Visit: Payer: Self-pay | Admitting: Orthopedic Surgery

## 2024-08-19 NOTE — Progress Notes (Signed)
 Anesthesia Review:  PCP: Cardiologist : [place] LVO 06/03/24  Vascular- DR Silver- LOV 06/03/24   PPM/ ICD: Device Orders: Rep Notified:  Chest x-ray : EKG : 8/126/25  Carotids-08/07/24  Echo : 11/16/22  Stress test: 2022  Cardiac Cath :  11/16/22   Activity level:  Sleep Study/ CPAP : Fasting Blood Sugar :      / Checks Blood Sugar -- times a day:    Blood Thinner/ Instructions /Last Dose: ASA / Instructions/ Last Dose :    04/2024- right hip

## 2024-08-19 NOTE — Patient Instructions (Signed)
 SURGICAL WAITING ROOM VISITATION  Patients having surgery or a procedure may have no more than 2 support people in the waiting area - these visitors may rotate.    Children under the age of 24 must have an adult with them who is not the patient.  Visitors with respiratory illnesses are discouraged from visiting and should remain at home.  If the patient needs to stay at the hospital during part of their recovery, the visitor guidelines for inpatient rooms apply. Pre-op nurse will coordinate an appropriate time for 1 support person to accompany patient in pre-op.  This support person may not rotate.    Please refer to the [facility] website for the visitor guidelines for Inpatients (after your surgery is over and you are in a regular room).       Your procedure is scheduled on:  08/25/2024    Report to [facility] Main Entrance    Report to admitting at  1000 AM   Call this number if you have problems the morning of surgery [phone]   Do not eat food :After Midnight.   After Midnight you may have the following liquids until _ 0915_____ AM  DAY OF SURGERY  Water  Non-Citrus Juices (without pulp, NO RED-Apple, White grape, White cranberry) Black Coffee (NO MILK/CREAM OR CREAMERS, sugar ok)  Clear Tea (NO MILK/CREAM OR CREAMERS, sugar ok) regular and decaf                             Plain Jell-O (NO RED)                                           Fruit ices (not with fruit pulp, NO RED)                                     Popsicles (NO RED)                                                               Sports drinks like Gatorade (NO RED)                    The day of surgery:  Drink ONE (1) Pre-Surgery Clear Ensure or G2 at 0915 AM the morning of surgery. Drink in one sitting. Do not sip.  This drink was given to you during your hospital  pre-op appointment visit. Nothing else to drink after completing the  Pre-Surgery Clear Ensure or G2.          If you have  questions, please contact your surgeon's office.       Oral Hygiene is also important to reduce your risk of infection.                                    Remember - BRUSH YOUR TEETH THE MORNING OF SURGERY WITH YOUR REGULAR TOOTHPASTE  DENTURES WILL BE REMOVED PRIOR TO SURGERY PLEASE DO NOT APPLY Poly grip OR  ADHESIVES!!!   Do NOT smoke after Midnight   Stop all vitamins and herbal supplements 7 days before surgery.   Take these medicines the morning of surgery with A SIP OF WATER :  Amlodipine , Inhalers as usual and bring, Ranexa , Pantoprazole , Proscar , Imdur , Toprol    DO NOT TAKE ANY ORAL DIABETIC MEDICATIONS DAY OF YOUR SURGERY  Bring CPAP mask and tubing day of surgery.                              You may not have any metal on your body including hair pins, jewelry, and body piercing             Do not wear make-up, lotions, powders, perfumes/cologne, or deodorant  Do not wear nail polish including gel and S&S, artificial/acrylic nails, or any other type of covering on natural nails including finger and toenails. If you have artificial nails, gel coating, etc. that needs to be removed by a nail salon please have this removed prior to surgery or surgery may need to be canceled/ delayed if the surgeon/ anesthesia feels like they are unable to be safely monitored.   Do not shave  48 hours prior to surgery.               Men may shave face and neck.   Do not bring valuables to the hospital. [facility] IS NOT             RESPONSIBLE   FOR VALUABLES.   Contacts, glasses, dentures or bridgework may not be worn into surgery.   Bring small overnight bag day of surgery.   DO NOT BRING YOUR HOME MEDICATIONS TO THE HOSPITAL. PHARMACY WILL DISPENSE MEDICATIONS LISTED ON YOUR MEDICATION LIST TO YOU DURING YOUR ADMISSION IN THE HOSPITAL!    Patients discharged on the day of surgery will not be allowed to drive home.  Someone NEEDS to stay with you for the first 24 hours after  anesthesia.   Special Instructions: Bring a copy of your healthcare power of attorney and living will documents the day of surgery if you haven't scanned them before.              Please read over the following fact sheets you were given: IF YOU HAVE QUESTIONS ABOUT YOUR PRE-OP INSTRUCTIONS PLEASE CALL [phone].   If you received a COVID test during your pre-op visit  it is requested that you wear a mask when out in public, stay away from anyone that may not be feeling well and notify your surgeon if you develop symptoms. If you test positive for Covid or have been in contact with anyone that has tested positive in the last 10 days please notify you surgeon.      Pre-operative 4 CHG Bath Instructions   You can play a key role in reducing the risk of infection after surgery. Your skin needs to be as free of germs as possible. You can reduce the number of germs on your skin by washing with CHG (chlorhexidine  gluconate) soap before surgery. CHG is an antiseptic soap that kills germs and continues to kill germs even after washing.   DO NOT use if you have an allergy to chlorhexidine /CHG or antibacterial soaps. If your skin becomes reddened or irritated, stop using the CHG and notify one of our RNs at [phone].   Please shower with the CHG soap starting 4 days before surgery using the following schedule:  Please keep in mind the following:  DO NOT shave, including legs and underarms, starting the day of your first shower.   You may shave your face at any point before/day of surgery.  Place clean sheets on your bed the day you start using CHG soap. Use a clean washcloth (not used since being washed) for each shower. DO NOT sleep with pets once you start using the CHG.   CHG Shower Instructions:  If you choose to wash your hair and private area, wash first with your normal shampoo/soap.  After you use shampoo/soap, rinse your hair and body thoroughly to remove shampoo/soap residue.   Turn the water  OFF and apply about 3 tablespoons (45 ml) of CHG soap to a CLEAN washcloth.  Apply CHG soap ONLY FROM YOUR NECK DOWN TO YOUR TOES (washing for 3-5 minutes)  DO NOT use CHG soap on face, private areas, open wounds, or sores.  Pay special attention to the area where your surgery is being performed.  If you are having back surgery, having someone wash your back for you may be helpful. Wait 2 minutes after CHG soap is applied, then you may rinse off the CHG soap.  Pat dry with a clean towel  Put on clean clothes/pajamas   If you choose to wear lotion, please use ONLY the CHG-compatible lotions on the back of this paper.     Additional instructions for the day of surgery: DO NOT APPLY any lotions, deodorants, cologne, or perfumes.   Put on clean/comfortable clothes.  Brush your teeth.  Ask your nurse before applying any prescription medications to the skin.      CHG Compatible Lotions   Aveeno Moisturizing lotion  Cetaphil Moisturizing Cream  Cetaphil Moisturizing Lotion  Clairol Herbal Essence Moisturizing Lotion, Dry Skin  Clairol Herbal Essence Moisturizing Lotion, Extra Dry Skin  Clairol Herbal Essence Moisturizing Lotion, Normal Skin  Curel Age Defying Therapeutic Moisturizing Lotion with Alpha Hydroxy  Curel Extreme Care Body Lotion  Curel Soothing Hands Moisturizing Hand Lotion  Curel Therapeutic Moisturizing Cream, Fragrance-Free  Curel Therapeutic Moisturizing Lotion, Fragrance-Free  Curel Therapeutic Moisturizing Lotion, Original Formula  Eucerin Daily Replenishing Lotion  Eucerin Dry Skin Therapy Plus Alpha Hydroxy Crme  Eucerin Dry Skin Therapy Plus Alpha Hydroxy Lotion  Eucerin Original Crme  Eucerin Original Lotion  Eucerin Plus Crme Eucerin Plus Lotion  Eucerin TriLipid Replenishing Lotion  Keri Anti-Bacterial Hand Lotion  Keri Deep Conditioning Original Lotion Dry Skin Formula Softly Scented  Keri Deep Conditioning Original Lotion, Fragrance  Free Sensitive Skin Formula  Keri Lotion Fast Absorbing Fragrance Free Sensitive Skin Formula  Keri Lotion Fast Absorbing Softly Scented Dry Skin Formula  Keri Original Lotion  Keri Skin Renewal Lotion Keri Silky Smooth Lotion  Keri Silky Smooth Sensitive Skin Lotion  Nivea Body Creamy Conditioning Oil  Nivea Body Extra Enriched Teacher, Adult Education Moisturizing Lotion Nivea Crme  Nivea Skin Firming Lotion  NutraDerm 30 Skin Lotion  NutraDerm Skin Lotion  NutraDerm Therapeutic Skin Cream  NutraDerm Therapeutic Skin Lotion  ProShield Protective Hand Cream  Provon moisturizing lotion

## 2024-08-20 ENCOUNTER — Encounter (HOSPITAL_COMMUNITY): Payer: Self-pay

## 2024-08-20 ENCOUNTER — Encounter (HOSPITAL_COMMUNITY)
Admission: RE | Admit: 2024-08-20 | Discharge: 2024-08-20 | Disposition: A | Source: Ambulatory Visit | Attending: Internal Medicine | Admitting: Internal Medicine

## 2024-08-20 ENCOUNTER — Other Ambulatory Visit: Payer: Self-pay

## 2024-08-20 HISTORY — DX: Chronic obstructive pulmonary disease, unspecified: J44.9

## 2024-08-21 ENCOUNTER — Encounter (HOSPITAL_COMMUNITY): Admission: RE | Admit: 2024-08-21 | Discharge: 2024-08-21 | Attending: Orthopedic Surgery

## 2024-08-21 DIAGNOSIS — Z01818 Encounter for other preprocedural examination: Secondary | ICD-10-CM

## 2024-08-21 LAB — BASIC METABOLIC PANEL WITH GFR
Anion gap: 13 (ref 5–15)
BUN: 12 mg/dL (ref 8–23)
CO2: 22 mmol/L (ref 22–32)
Calcium: 9.5 mg/dL (ref 8.9–10.3)
Chloride: 102 mmol/L (ref 98–111)
Creatinine, Ser: 1 mg/dL (ref 0.61–1.24)
GFR, Estimated: >60 mL/min (ref >60)
Glucose, Bld: 93 mg/dL (ref 70–99)
Potassium: 4.4 mmol/L (ref 3.5–5.1)
Sodium: 137 mmol/L (ref 135–145)

## 2024-08-21 LAB — CBC
HCT: 40.2 % (ref 39.0–52.0)
Hemoglobin: 13.1 g/dL (ref 13.0–17.0)
MCH: 27.4 pg (ref 26.0–34.0)
MCHC: 32.6 g/dL (ref 30.0–36.0)
MCV: 84.1 fL (ref 80.0–100.0)
Platelets: 248 K/uL (ref 150–400)
RBC: 4.78 MIL/uL (ref 4.22–5.81)
RDW: 14.8 % (ref 11.5–15.5)
WBC: 7.7 K/uL (ref 4.0–10.5)
nRBC: 0 % (ref 0.0–0.2)

## 2024-08-21 LAB — SURGICAL PCR SCREEN
MRSA, PCR: NEGATIVE
Staphylococcus aureus: POSITIVE — AB

## 2024-08-21 NOTE — Progress Notes (Signed)
 Patient stated his last dose of Eliquis will be today Dec. 4, 2025 at 1800.

## 2024-08-22 NOTE — Anesthesia Preprocedure Evaluation (Signed)
 Anesthesia Evaluation  Patient identified by MRN, date of birth, ID band Patient awake    Reviewed: Allergy & Precautions, H&P , NPO status , Patient's Chart, lab work & pertinent test results  Airway Mallampati: II  TM Distance: >3 FB Neck ROM: Full    Dental  (+) Dental Advisory Given, Missing,    Pulmonary neg pulmonary ROS, former smoker   Pulmonary exam normal breath sounds clear to auscultation       Cardiovascular hypertension, Pt. on medications + CAD, + Past MI and + Cardiac Stents  Normal cardiovascular exam Rhythm:Regular Rate:Normal     Neuro/Psych negative neurological ROS  negative psych ROS   GI/Hepatic negative GI ROS, Neg liver ROS,,,  Endo/Other  negative endocrine ROS    Renal/GU negative Renal ROS  negative genitourinary   Musculoskeletal  (+) Arthritis , Osteoarthritis,    Abdominal   Peds negative pediatric ROS (+)  Hematology  (+) Blood dyscrasia, anemia   Anesthesia Other Findings   Reproductive/Obstetrics negative OB ROS                              Anesthesia Physical Anesthesia Plan  ASA: 3  Anesthesia Plan: Spinal and MAC   Post-op Pain Management:    Induction: Intravenous  PONV Risk Score and Plan: 1 and Propofol  infusion and Treatment may vary due to age or medical condition  Airway Management Planned: Simple Face Mask  Additional Equipment: None  Intra-op Plan:   Post-operative Plan:   Informed Consent: I have reviewed the patients History and Physical, chart, labs and discussed the procedure including the risks, benefits and alternatives for the proposed anesthesia with the patient or authorized representative who has indicated his/her understanding and acceptance.     Dental advisory given  Plan Discussed with: CRNA and Anesthesiologist  Anesthesia Plan Comments: (See PAT note 08/22/2024  DISCUSSION:72 y.o. former smoker w/ h/o HTN,  CAD s/p DES to LAD 2021, atypical chest pain, right hip djd scheduled for above procedure 08/25/24 with Dr. Norleen Gavel.    At 02/01/2024 cardio visit pt experiencing chest discomfort Stress test ordered. Cardiac cath 03/2023 with patent stent, nonobstructive CAD. Low risk stress test 02/19/2024.   Pt cleared to proceed with surgery.    H/o left common carotid artery occlusion with patent bulb, and antegrade ICA flow from external carotid artery collaterals.  Right side demonstrates mild disease. Follows with vascular, last seen 08/07/2024. No change in US  from last year, pt asymptomatic.    S/p right total hip 05/12/24. Pt reports experience chest pain after procedure, similar to his atypical chest pain sx.     Seen by cardio 06/03/2024. Per OV note, Overall he seems to be doing well, his chest pains are not as troublesome as they have been in the past.  He safely underwent hip surgery 3 weeks ago and I believe he is still stable to have his other hip done now.    Myocardial Perfusion 02/19/2024 (Care Everywhere) This result has an attachment that is not available.    ECG test result was negative for evidence of ischemia. There was no  chest pain during the exam.    There is an attenuation artifact in the inferior wall, there is no  evidence of infarct or ischemia.    The SPECT images demonstrate a fixed, small sized perfusion abnormality  of mild intensity located in the mid to basal inferior wall.    Overall  left ventricular systolic function was normal. The calculated  ejection fraction was measured at 57%.    Low cardiovascular risk    Cardiac Cath 03/26/2023 (Care Everywhere) This result has an attachment that is not available.    Non-obstructive CAD    Cardiac Cath 11/16/2022 Impression: Widely patent LAD stent placed in 2021, proximal and mid to distal segment of the LAD has a stent within the stent.  There is mild neointimal hyperplasia.  Suspect thrombus formation in 1 of these  areas which is resolved with heparin  and Aggrastat . )         Anesthesia Quick Evaluation

## 2024-08-22 NOTE — Progress Notes (Signed)
 Anesthesia Chart Review   Case: [id number] Date/Time: 08/25/24 1206   Procedure: ARTHROPLASTY, HIP, TOTAL, ANTERIOR APPROACH (Left: Hip)   Anesthesia type: Spinal   Pre-op diagnosis: LEFT HIP DEGENERATIVE JOINT DISEASE   Location: [facility] ROOM 06 / [facility]   Surgeons: Yvone Rush, MD       DISCUSSION:72 y.o. former smoker w/ h/o HTN, CAD s/p DES to LAD 2021, atypical chest pain, right hip djd scheduled for above procedure 08/25/24 with Dr. Rush Yvone.   At 02/01/2024 cardio visit pt experiencing chest discomfort Stress test ordered. Cardiac cath 03/2023 with patent stent, nonobstructive CAD. Low risk stress test 02/19/2024.   Pt cleared to proceed with surgery.    H/o left common carotid artery occlusion with patent bulb, and antegrade ICA flow from external carotid artery collaterals.  Right side demonstrates mild disease. Follows with vascular, last seen 08/07/2024. No change in US  from last year, pt asymptomatic.   S/p right total hip 05/12/24. Pt reports experience chest pain after procedure, similar to his atypical chest pain sx.    Seen by cardio 06/03/2024. Per OV note, Overall he seems to be doing well, his chest pains are not as troublesome as they have been in the past.  He safely underwent hip surgery 3 weeks ago and I believe he is still stable to have his other hip done now.   Pt reports last dose of Xarelto  08/21/2024.  VS: There were no vitals taken for this visit.  PROVIDERS: Roanna Ezekiel NOVAK, MD is PCP  Elspeth Kitten, MD is Cardiologist  LABS: Labs reviewed: Acceptable for surgery. (all labs ordered are listed, but only abnormal results are displayed)  Labs Reviewed - No data to display   IMAGES:   EKG:   CV: Echo 02/28/24 (Care Everywhere) Image Quality  Technically adequate. A two-dimensional transthoracic echocardiogram with color flow  and Doppler was performed.  Normal LV size, wall thickness, wall motion and systolic function with ejection fraction 60-65%   There is mild mitral regurgitation.  There is mild tricuspid regurgitation.  No pulmonary hypertension.  There is no pericardial effusion.  IVC size was normal.  The ascending aorta is normal size.  There is no significant change in comparison with the last study.    Myocardial Perfusion 02/19/2024 (Care Everywhere) This result has an attachment that is not available.    ECG test result was negative for evidence of ischemia. There was no  chest pain during the exam.    There is an attenuation artifact in the inferior wall, there is no  evidence of infarct or ischemia.    The SPECT images demonstrate a fixed, small sized perfusion abnormality  of mild intensity located in the mid to basal inferior wall.    Overall left ventricular systolic function was normal. The calculated  ejection fraction was measured at 57%.    Low cardiovascular risk    Cardiac Cath 03/26/2023 (Care Everywhere) This result has an attachment that is not available.    Non-obstructive CAD    Cardiac Cath 11/16/2022 Impression: Widely patent LAD stent placed in 2021, proximal and mid to distal segment of the LAD has a stent within the stent.  There is mild neointimal hyperplasia.  Suspect thrombus formation in 1 of these areas which is resolved with heparin  and Aggrastat .   Recommendation: Aspirin  for 30 days, Plavix  for 1 year or indefinitely, will add Xarelto  10 mg daily to prevent thrombus formation.   Amlodipine  increased to 10 mg daily for hypertension  and also angina pectoris, discontinued Ranexa  and also Jardiance.   Echo 11/16/2022 1. Left ventricular ejection fraction, by estimation, is 60 to 65%. The  left ventricle has normal function. The left ventricle has no regional  wall motion abnormalities. There is moderate concentric left ventricular  hypertrophy. Left ventricular  diastolic parameters are consistent with Grade I diastolic dysfunction  (impaired relaxation).   2. Right ventricular systolic  function is normal. The right ventricular  size is normal. There is normal pulmonary artery systolic pressure.   3. Right atrial size was moderately dilated.   4. The mitral valve is normal in structure. Trivial mitral valve  regurgitation. No evidence of mitral stenosis.   5. The aortic valve is normal in structure. Aortic valve regurgitation is  not visualized. No aortic stenosis is present.   6. The inferior vena cava is normal in size with greater than 50%  respiratory variability, suggesting right atrial pressure of 3 mmHg.    Past Medical History:  Diagnosis Date   Anemia    Arthritis    Carotid artery occlusion    Cataracts, bilateral    COPD (chronic obstructive pulmonary disease) (HCC)    Coronary artery disease    Hyperlipidemia    Hypertension    Myocardial infarction (HCC)    Seasonal allergies    Sickle cell trait     Past Surgical History:  Procedure Laterality Date   APPENDECTOMY     HERNIA REPAIR     KNEE ARTHROSCOPY WITH ANTERIOR CRUCIATE LIGAMENT (ACL) REPAIR Left    LEFT HEART CATH AND CORONARY ANGIOGRAPHY N/A 05/11/2020   Procedure: LEFT HEART CATH AND CORONARY ANGIOGRAPHY;  Surgeon: Levern Hutching, MD;  Location: [facility] INVASIVE CV LAB;  Service: Cardiovascular;  Laterality: N/A;   LEFT HEART CATH AND CORONARY ANGIOGRAPHY N/A 11/16/2022   Procedure: LEFT HEART CATH AND CORONARY ANGIOGRAPHY;  Surgeon: Ladona Heinz, MD;  Location: [facility] INVASIVE CV LAB;  Service: Cardiovascular;  Laterality: N/A;  FIrst case, 7:30 AM   STENT PLACEMENT VASCULAR ([facility] HX)     TOTAL HIP ARTHROPLASTY Right 05/12/2024   Procedure: ARTHROPLASTY, HIP, TOTAL, ANTERIOR APPROACH;  Surgeon: Yvone Rush, MD;  Location: [facility];  Service: Orthopedics;  Laterality: Right;    MEDICATIONS: No current facility-administered medications for this encounter.    amLODipine  (NORVASC ) 10 MG tablet   aspirin  EC 81 MG tablet   atorvastatin  (LIPITOR ) 20 MG tablet   chlorhexidine  (PERIDEX ) 0.12 % solution    diclofenac  Sodium (VOLTAREN  ARTHRITIS PAIN) 1 % GEL   docusate sodium  (COLACE) 100 MG capsule   finasteride  (PROSCAR ) 5 MG tablet   furosemide  (LASIX ) 40 MG tablet   isosorbide  mononitrate (IMDUR ) 30 MG 24 hr tablet   metoprolol  succinate (TOPROL -XL) 50 MG 24 hr tablet   nitroGLYCERIN  (NITROLINGUAL ) 0.4 MG/SPRAY spray   OVER THE COUNTER MEDICATION   oxyCODONE  (OXY IR/ROXICODONE ) 5 MG immediate release tablet   pantoprazole  (PROTONIX ) 40 MG tablet   Potassium Chloride  ER 20 MEQ TBCR   pramipexole  (MIRAPEX ) 0.125 MG tablet   ranolazine  (RANEXA ) 500 MG 12 hr tablet   rivaroxaban  (XARELTO ) 10 MG TABS tablet   tamsulosin  (FLOMAX ) 0.4 MG CAPS capsule   tiotropium (SPIRIVA) 18 MCG inhalation capsule   albuterol  (VENTOLIN  HFA) 108 (90 Base) MCG/ACT inhaler   benzonatate  (TESSALON ) 200 MG capsule   Iron -FA-B Cmp-C-Biot-Probiotic (FUSION PLUS) CAPS   isosorbide  mononitrate (IMDUR ) 60 MG 24 hr tablet   oxyCODONE -acetaminophen  (PERCOCET/ROXICET) 5-325 MG tablet   tiZANidine  (ZANAFLEX ) 2 MG tablet  Harlene Hoots Ward, PA-C [facility] Pre-Surgical Testing [phone]

## 2024-08-23 NOTE — H&P (Signed)
 TOTAL HIP ADMISSION H&P  Patient is admitted for left total hip arthroplasty.  Subjective:  Chief Complaint: left hip pain  HPI: [name] [name], 73 y.o. male, has a history of pain and functional disability in the left hip(s) due to arthritis and patient has failed non-surgical conservative treatments for greater than 12 weeks to include NSAID's and/or analgesics, weight reduction as appropriate, and activity modification.  Onset of symptoms was gradual starting 2 years ago with gradually worsening course since that time.The patient noted no past surgery on the left hip(s).  Patient currently rates pain in the left hip at 8 out of 10 with activity. Patient has night pain, worsening of pain with activity and weight bearing, trendelenberg gait, pain that interfers with activities of daily living, and pain with passive range of motion. Patient has evidence of periarticular osteophytes and joint space narrowing by imaging studies. This condition presents safety issues increasing the risk of falls.  There is no current active infection.  Patient Active Problem List   Diagnosis Date Noted   Primary osteoarthritis of right hip 05/11/2024   NSTEMI (non-ST elevated myocardial infarction) (HCC) 11/16/2022   Essential hypertension 11/16/2022   Hyperlipidemia 11/16/2022   Tobacco abuse 11/16/2022   Hypokalemia 11/16/2022   Coronary artery disease involving native coronary artery of native heart with unstable angina pectoris (HCC) 11/16/2022   Coronary arteriosclerosis after percutaneous transluminal coronary angioplasty (PTCA) 11/16/2022   Acute coronary syndrome (HCC) 03/28/2019   Past Medical History:  Diagnosis Date   Anemia    Arthritis    Carotid artery occlusion    Cataracts, bilateral    COPD (chronic obstructive pulmonary disease) (HCC)    Coronary artery disease    Hyperlipidemia    Hypertension    Myocardial infarction (HCC)    Seasonal allergies    Sickle cell trait     Past  Surgical History:  Procedure Laterality Date   APPENDECTOMY     HERNIA REPAIR     KNEE ARTHROSCOPY WITH ANTERIOR CRUCIATE LIGAMENT (ACL) REPAIR Left    LEFT HEART CATH AND CORONARY ANGIOGRAPHY N/A 05/11/2020   Procedure: LEFT HEART CATH AND CORONARY ANGIOGRAPHY;  Surgeon: Levern Hutching, MD;  Location: [facility];  Service: Cardiovascular;  Laterality: N/A;   LEFT HEART CATH AND CORONARY ANGIOGRAPHY N/A 11/16/2022   Procedure: LEFT HEART CATH AND CORONARY ANGIOGRAPHY;  Surgeon: Ladona Heinz, MD;  Location: [facility];  Service: Cardiovascular;  Laterality: N/A;  FIrst case, 7:30 AM   STENT PLACEMENT VASCULAR ([facility] HX)     TOTAL HIP ARTHROPLASTY Right 05/12/2024   Procedure: ARTHROPLASTY, HIP, TOTAL, ANTERIOR APPROACH;  Surgeon: Yvone Rush, MD;  Location: [facility];  Service: Orthopedics;  Laterality: Right;    No current facility-administered medications for this encounter.   Current Outpatient Medications  Medication Sig Dispense Refill Last Dose/Taking   amLODipine  (NORVASC ) 10 MG tablet Take 1 tablet (10 mg total) by mouth daily. 90 tablet 0 Taking   aspirin  EC 81 MG tablet Take 81 mg by mouth daily.   Taking   atorvastatin  (LIPITOR ) 20 MG tablet Take 20 mg by mouth at bedtime.   Taking   chlorhexidine  (PERIDEX ) 0.12 % solution Use as directed 10 mLs in the mouth or throat 2 (two) times daily.   Taking   diclofenac  Sodium (VOLTAREN  ARTHRITIS PAIN) 1 % GEL Apply 2 g topically 3 (three) times daily.   Taking   docusate sodium  (COLACE) 100 MG capsule Take 1 capsule (100 mg  total) by mouth 2 (two) times daily. 30 capsule 0 Taking   finasteride  (PROSCAR ) 5 MG tablet Take 5 mg by mouth daily.   Taking   furosemide  (LASIX ) 40 MG tablet Take 40 mg by mouth daily.   Taking   isosorbide  mononitrate (IMDUR ) 30 MG 24 hr tablet Take 60 mg by mouth daily.   Taking   metoprolol  succinate (TOPROL -XL) 50 MG 24 hr tablet Take 50 mg by mouth daily. Take with or immediately following a meal.    Taking   nitroGLYCERIN  (NITROLINGUAL ) 0.4 MG/SPRAY spray Place 1 spray under the tongue every 5 (five) minutes as needed for chest pain.    Taking As Needed   OVER THE COUNTER MEDICATION Apply 1 Application topically in the morning and at bedtime. Horse Liniment Gel   Taking   oxyCODONE  (OXY IR/ROXICODONE ) 5 MG immediate release tablet Take 5 mg by mouth See admin instructions. May use up to 3 times daily as needed   Taking   pantoprazole  (PROTONIX ) 40 MG tablet Take 1 tablet (40 mg total) by mouth daily at 6 (six) AM. 30 tablet 1 Taking   Potassium Chloride  ER 20 MEQ TBCR Take 40 mEq by mouth daily.   Taking   pramipexole  (MIRAPEX ) 0.125 MG tablet Take 0.125 mg by mouth at bedtime.   Taking   ranolazine  (RANEXA ) 500 MG 12 hr tablet Take 500 mg by mouth 2 (two) times daily.   Taking   rivaroxaban  (XARELTO ) 10 MG TABS tablet Take 1 tablet (10 mg total) by mouth daily. 30 tablet 3 Taking   tamsulosin  (FLOMAX ) 0.4 MG CAPS capsule Take 0.8 mg by mouth at bedtime.   1 Taking   tiotropium (SPIRIVA ) 18 MCG inhalation capsule Place 18 mcg into inhaler and inhale daily.   Taking   albuterol  (VENTOLIN  HFA) 108 (90 Base) MCG/ACT inhaler Inhale 2 puffs into the lungs every 4 (four) hours as needed for shortness of breath or wheezing.      benzonatate  (TESSALON ) 200 MG capsule Take 200 mg by mouth 3 (three) times daily as needed for cough.      Iron -FA-B Cmp-C-Biot-Probiotic (FUSION PLUS) CAPS Take 1 capsule by mouth daily. (Patient not taking: Reported on 08/19/2024)   Not Taking   isosorbide  mononitrate (IMDUR ) 60 MG 24 hr tablet Take 60 mg by mouth daily. (Patient not taking: Reported on 08/19/2024)   Not Taking   oxyCODONE -acetaminophen  (PERCOCET/ROXICET) 5-325 MG tablet Take 1 tablet by mouth every 4 (four) hours as needed for severe pain (pain score 7-10). (Patient not taking: Reported on 08/19/2024) 30 tablet 0 Not Taking   tiZANidine  (ZANAFLEX ) 2 MG tablet Take 1 tablet (2 mg total) by mouth every 8 (eight)  hours as needed for muscle spasms. (Patient not taking: Reported on 08/19/2024) 40 tablet 0 Not Taking   Allergies  Allergen Reactions   Shellfish Protein-Containing Drug Products Anaphylaxis, Shortness Of Breath and Other (See Comments)    My throat goes numb   Porcine (Pork) Protein-Containing Drug Products     Does not eat, not allergic    Social History   Tobacco Use   Smoking status: Former    Types: Cigarettes   Smokeless tobacco: Former    Types: Chew    Quit date: 09/18/1970  Substance Use Topics   Alcohol use: Not Currently    Family History  Problem Relation Age of Onset   Colon cancer Neg Hx    Esophageal cancer Neg Hx    Rectal cancer Neg Hx  Stomach cancer Neg Hx      Review of Systems neg as relates to HPI  Objective:Physical exam: Well-developed well-nourished patient in no acute distress. Alert and oriented x3 HEENT:within normal limits Cardiac: Regular rate and rhythm Pulmonary: Lungs clear to auscultation Abdomen: Soft and nontender.  Normal active bowel sounds  Musculoskeletal: Left hip exam: Trendelenberg gait.  Pain with limited motion of left hip    Vital signs in last 24 hours:    Labs: Recent Results (from the past 2160 hours)  Surgical pcr screen     Status: Abnormal   Collection Time: 08/21/24 11:33 AM   Specimen: Nasal Mucosa; Nasal Swab  Result Value Ref Range   MRSA, PCR NEGATIVE NEGATIVE   Staphylococcus aureus POSITIVE (A) NEGATIVE    Comment: (NOTE) The Xpert SA Assay (FDA approved for NASAL specimens in patients 37 years of age and older), is one component of a comprehensive surveillance program. It is not intended to diagnose infection nor to guide or monitor treatment. Performed at [facility], [address] [place], [place] [address]   Basic metabolic panel per protocol     Status: None   Collection Time: 08/21/24 11:33 AM  Result Value Ref Range   Sodium 137 135 - 145 mmol/L   Potassium 4.4  3.5 - 5.1 mmol/L   Chloride 102 98 - 111 mmol/L   CO2 22 22 - 32 mmol/L   Glucose, Bld 93 70 - 99 mg/dL    Comment: Glucose reference range applies only to samples taken after fasting for at least 8 hours.   BUN 12 8 - 23 mg/dL   Creatinine, Ser 8.99 0.61 - 1.24 mg/dL   Calcium  9.5 8.9 - 10.3 mg/dL   GFR, Estimated >39 >39 mL/min    Comment: (NOTE) Calculated using the CKD-EPI Creatinine Equation (2021)    Anion gap 13 5 - 15    Comment: Performed at [facility], [address] [place], [place] [address]  CBC per protocol     Status: None   Collection Time: 08/21/24 11:33 AM  Result Value Ref Range   WBC 7.7 4.0 - 10.5 K/uL   RBC 4.78 4.22 - 5.81 MIL/uL   Hemoglobin 13.1 13.0 - 17.0 g/dL   HCT 59.7 60.9 - 47.9 %   MCV 84.1 80.0 - 100.0 fL   MCH 27.4 26.0 - 34.0 pg   MCHC 32.6 30.0 - 36.0 g/dL   RDW 85.1 88.4 - 84.4 %   Platelets 248 150 - 400 K/uL   nRBC 0.0 0.0 - 0.2 %    Comment: Performed at [facility], [address], [place], [place] [address]  Type and screen [facility]     Status: None   Collection Time: 08/21/24 11:33 AM  Result Value Ref Range   ABO/RH(D) B POS    Antibody Screen NEG    Sample Expiration 09/04/2024,2359    Extend sample reason      NO TRANSFUSIONS OR PREGNANCY IN THE PAST 3 MONTHS Performed at [facility], [address], [place], [place] [address]      Estimated body mass index is 25.63 kg/m as calculated from the following:   Height as of 08/21/24: 5' 11 (1.803 m).   Weight as of 08/21/24: 83.4 kg.   Imaging Review Plain radiographs demonstrate severe degenerative joint disease of the left hip(s). The bone quality appears to be good for age and reported activity level.  Assessment/Plan:  End stage arthritis, left hip(s)  The patient history, physical examination, clinical judgement of the provider and imaging studies are consistent with end stage  degenerative joint disease of the left hip(s) and total hip arthroplasty is deemed medically necessary. The treatment options including medical management, injection therapy, arthroscopy and arthroplasty were discussed at length. The risks and benefits of total hip arthroplasty were presented and reviewed. The risks due to aseptic loosening, infection, stiffness, dislocation/subluxation,  thromboembolic complications and other imponderables were discussed.  The patient acknowledged the explanation, agreed to proceed with the plan and consent was signed. Patient is being admitted for inpatient treatment for surgery, pain control, PT, OT, prophylactic antibiotics, VTE prophylaxis, progressive ambulation and ADL's and discharge planning.The patient is planning to be discharged hope to outpatient PT

## 2024-08-25 ENCOUNTER — Ambulatory Visit (HOSPITAL_COMMUNITY)
Admission: RE | Admit: 2024-08-25 | Discharge: 2024-08-27 | Disposition: A | Attending: Orthopedic Surgery | Admitting: Orthopedic Surgery

## 2024-08-25 ENCOUNTER — Encounter (HOSPITAL_COMMUNITY): Payer: Self-pay | Admitting: Physician Assistant

## 2024-08-25 ENCOUNTER — Other Ambulatory Visit: Payer: Self-pay

## 2024-08-25 ENCOUNTER — Encounter (HOSPITAL_COMMUNITY): Payer: Self-pay | Admitting: Orthopedic Surgery

## 2024-08-25 ENCOUNTER — Ambulatory Visit (HOSPITAL_COMMUNITY): Payer: Self-pay | Admitting: Physician Assistant

## 2024-08-25 ENCOUNTER — Ambulatory Visit (HOSPITAL_COMMUNITY)

## 2024-08-25 ENCOUNTER — Encounter: Admission: RE | Disposition: A | Payer: Self-pay | Attending: Orthopedic Surgery

## 2024-08-25 DIAGNOSIS — M1612 Unilateral primary osteoarthritis, left hip: Secondary | ICD-10-CM

## 2024-08-25 DIAGNOSIS — Z96642 Presence of left artificial hip joint: Secondary | ICD-10-CM

## 2024-08-25 DIAGNOSIS — Z01818 Encounter for other preprocedural examination: Secondary | ICD-10-CM

## 2024-08-25 HISTORY — PX: TOTAL HIP ARTHROPLASTY: SHX124

## 2024-08-25 LAB — TYPE AND SCREEN
ABO/RH(D): B POS
Antibody Screen: NEGATIVE

## 2024-08-25 SURGERY — ARTHROPLASTY, HIP, TOTAL, ANTERIOR APPROACH
Anesthesia: Spinal | Site: Hip | Laterality: Left

## 2024-08-25 MED ORDER — LIDOCAINE HCL (PF) 2 % IJ SOLN
INTRAMUSCULAR | Status: AC
  Filled 2024-08-25: qty 5

## 2024-08-25 MED ORDER — ONDANSETRON HCL 4 MG/2ML IJ SOLN
INTRAMUSCULAR | Status: AC
  Filled 2024-08-25: qty 2

## 2024-08-25 MED ORDER — TRANEXAMIC ACID-NACL 1000-0.7 MG/100ML-% IV SOLN
1000.0000 mg | INTRAVENOUS | Status: AC
  Administered 2024-08-25: 1000 mg via INTRAVENOUS
  Filled 2024-08-25: qty 100

## 2024-08-25 MED ORDER — FENTANYL CITRATE (PF) 100 MCG/2ML IJ SOLN
INTRAMUSCULAR | Status: DC | PRN
  Administered 2024-08-25 (×2): 50 ug via INTRAVENOUS

## 2024-08-25 MED ORDER — POLYETHYLENE GLYCOL 3350 17 G PO PACK: 17.0000 g | PACK | Freq: Every day | ORAL | Status: DC | PRN

## 2024-08-25 MED ORDER — FINASTERIDE 5 MG PO TABS
5.0000 mg | ORAL_TABLET | Freq: Every day | ORAL | Status: DC
  Administered 2024-08-25 – 2024-08-27 (×3): 5 mg via ORAL
  Filled 2024-08-25 (×3): qty 1

## 2024-08-25 MED ORDER — EPHEDRINE 5 MG/ML INJ
INTRAVENOUS | Status: AC
  Filled 2024-08-25: qty 5

## 2024-08-25 MED ORDER — BUPIVACAINE IN DEXTROSE 0.75-8.25 % IT SOLN
INTRATHECAL | Status: DC | PRN
  Administered 2024-08-25: 2 mL via INTRATHECAL

## 2024-08-25 MED ORDER — ALBUMIN HUMAN 5 % IV SOLN
INTRAVENOUS | Status: AC
  Filled 2024-08-25: qty 250

## 2024-08-25 MED ORDER — BUPIVACAINE-EPINEPHRINE (PF) 0.25% -1:200000 IJ SOLN
INTRAMUSCULAR | Status: DC | PRN
  Administered 2024-08-25: 40 mL

## 2024-08-25 MED ORDER — TAMSULOSIN HCL 0.4 MG PO CAPS
0.8000 mg | ORAL_CAPSULE | Freq: Every day | ORAL | Status: DC
  Administered 2024-08-25 – 2024-08-26 (×2): 0.8 mg via ORAL
  Filled 2024-08-25 (×2): qty 2

## 2024-08-25 MED ORDER — LACTATED RINGERS IV SOLN: INTRAVENOUS | Status: DC

## 2024-08-25 MED ORDER — DOCUSATE SODIUM 100 MG PO CAPS
100.0000 mg | ORAL_CAPSULE | Freq: Two times a day (BID) | ORAL | Status: DC
  Administered 2024-08-25 – 2024-08-27 (×4): 100 mg via ORAL
  Filled 2024-08-25 (×4): qty 1

## 2024-08-25 MED ORDER — ACETAMINOPHEN 500 MG PO TABS
1000.0000 mg | ORAL_TABLET | Freq: Four times a day (QID) | ORAL | Status: AC
  Administered 2024-08-25 – 2024-08-26 (×3): 1000 mg via ORAL
  Filled 2024-08-25 (×3): qty 2

## 2024-08-25 MED ORDER — MEPERIDINE HCL 25 MG/ML IJ SOLN: 6.2500 mg | INTRAMUSCULAR | Status: DC | PRN

## 2024-08-25 MED ORDER — OXYCODONE HCL 5 MG PO TABS
10.0000 mg | ORAL_TABLET | ORAL | Status: DC | PRN
  Administered 2024-08-26 – 2024-08-27 (×3): 10 mg via ORAL
  Filled 2024-08-25 (×2): qty 2

## 2024-08-25 MED ORDER — AMLODIPINE BESYLATE 10 MG PO TABS
10.0000 mg | ORAL_TABLET | Freq: Every day | ORAL | Status: DC
  Administered 2024-08-26 – 2024-08-27 (×2): 10 mg via ORAL
  Filled 2024-08-25 (×2): qty 1

## 2024-08-25 MED ORDER — HYDROMORPHONE HCL 1 MG/ML IJ SOLN: 0.5000 mg | INTRAMUSCULAR | Status: DC | PRN

## 2024-08-25 MED ORDER — SODIUM CHLORIDE 0.9 % IR SOLN
Status: DC | PRN
  Administered 2024-08-25: 1000 mL

## 2024-08-25 MED ORDER — BENZONATATE 100 MG PO CAPS: 200.0000 mg | ORAL_CAPSULE | Freq: Three times a day (TID) | ORAL | Status: DC | PRN

## 2024-08-25 MED ORDER — POTASSIUM CHLORIDE CRYS ER 20 MEQ PO TBCR
40.0000 meq | EXTENDED_RELEASE_TABLET | Freq: Every day | ORAL | Status: DC
  Administered 2024-08-26 – 2024-08-27 (×2): 40 meq via ORAL
  Filled 2024-08-25 (×2): qty 2

## 2024-08-25 MED ORDER — CEFAZOLIN SODIUM-DEXTROSE 2-4 GM/100ML-% IV SOLN
2.0000 g | INTRAVENOUS | Status: AC
  Administered 2024-08-25: 2 g via INTRAVENOUS
  Filled 2024-08-25: qty 100

## 2024-08-25 MED ORDER — RANOLAZINE ER 500 MG PO TB12
500.0000 mg | ORAL_TABLET | Freq: Two times a day (BID) | ORAL | Status: DC
  Administered 2024-08-25 – 2024-08-27 (×4): 500 mg via ORAL
  Filled 2024-08-25 (×4): qty 1

## 2024-08-25 MED ORDER — DEXAMETHASONE SOD PHOSPHATE PF 10 MG/ML IJ SOLN
INTRAMUSCULAR | Status: DC | PRN
  Administered 2024-08-25: 4 mg via INTRAVENOUS

## 2024-08-25 MED ORDER — ONDANSETRON HCL 4 MG PO TABS: 4.0000 mg | ORAL_TABLET | Freq: Four times a day (QID) | ORAL | Status: DC | PRN

## 2024-08-25 MED ORDER — ACETAMINOPHEN 500 MG PO TABS
1000.0000 mg | ORAL_TABLET | Freq: Once | ORAL | Status: AC
  Administered 2024-08-25: 1000 mg via ORAL
  Filled 2024-08-25: qty 2

## 2024-08-25 MED ORDER — BUPIVACAINE LIPOSOME 1.3 % IJ SUSP
INTRAMUSCULAR | Status: AC
  Filled 2024-08-25: qty 10

## 2024-08-25 MED ORDER — METHOCARBAMOL 500 MG PO TABS
500.0000 mg | ORAL_TABLET | Freq: Four times a day (QID) | ORAL | Status: DC | PRN
  Administered 2024-08-25 – 2024-08-27 (×6): 500 mg via ORAL
  Filled 2024-08-25 (×6): qty 1

## 2024-08-25 MED ORDER — OXYCODONE HCL 5 MG PO TABS
5.0000 mg | ORAL_TABLET | Freq: Once | ORAL | Status: AC | PRN
  Administered 2024-08-25: 5 mg via ORAL

## 2024-08-25 MED ORDER — ONDANSETRON HCL 4 MG/2ML IJ SOLN: 4.0000 mg | Freq: Four times a day (QID) | INTRAMUSCULAR | Status: DC | PRN

## 2024-08-25 MED ORDER — CHLORHEXIDINE GLUCONATE 0.12 % MT SOLN
10.0000 mL | Freq: Two times a day (BID) | OROMUCOSAL | Status: DC
  Administered 2024-08-25 – 2024-08-27 (×4): 10 mL via OROMUCOSAL
  Filled 2024-08-25 (×3): qty 15

## 2024-08-25 MED ORDER — CELECOXIB 200 MG PO CAPS
200.0000 mg | ORAL_CAPSULE | Freq: Once | ORAL | Status: AC
  Administered 2024-08-25: 200 mg via ORAL
  Filled 2024-08-25: qty 1

## 2024-08-25 MED ORDER — RIVAROXABAN 10 MG PO TABS
10.0000 mg | ORAL_TABLET | Freq: Every day | ORAL | Status: DC
  Administered 2024-08-26 – 2024-08-27 (×2): 10 mg via ORAL
  Filled 2024-08-25 (×2): qty 1

## 2024-08-25 MED ORDER — UMECLIDINIUM BROMIDE 62.5 MCG/ACT IN AEPB
1.0000 | INHALATION_SPRAY | Freq: Every day | RESPIRATORY_TRACT | Status: DC
  Administered 2024-08-25 – 2024-08-27 (×3): 1 via RESPIRATORY_TRACT
  Filled 2024-08-25: qty 7

## 2024-08-25 MED ORDER — METOPROLOL SUCCINATE ER 50 MG PO TB24
50.0000 mg | ORAL_TABLET | Freq: Every day | ORAL | Status: DC
  Administered 2024-08-26 – 2024-08-27 (×2): 50 mg via ORAL
  Filled 2024-08-25 (×2): qty 1

## 2024-08-25 MED ORDER — PROPOFOL 1000 MG/100ML IV EMUL
INTRAVENOUS | Status: AC
  Filled 2024-08-25: qty 100

## 2024-08-25 MED ORDER — POVIDONE-IODINE 10 % EX SWAB: 2.0000 | Freq: Once | CUTANEOUS | Status: DC

## 2024-08-25 MED ORDER — METOCLOPRAMIDE HCL 5 MG PO TABS: 5.0000 mg | ORAL_TABLET | Freq: Three times a day (TID) | ORAL | Status: DC | PRN

## 2024-08-25 MED ORDER — ONDANSETRON HCL 4 MG/2ML IJ SOLN
INTRAMUSCULAR | Status: DC | PRN
  Administered 2024-08-25: 4 mg via INTRAVENOUS

## 2024-08-25 MED ORDER — WATER FOR IRRIGATION, STERILE IR SOLN
Status: DC | PRN
  Administered 2024-08-25: 2000 mL

## 2024-08-25 MED ORDER — TIOTROPIUM BROMIDE MONOHYDRATE 18 MCG IN CAPS: 18.0000 ug | ORAL_CAPSULE | Freq: Every day | RESPIRATORY_TRACT | Status: DC

## 2024-08-25 MED ORDER — DEXAMETHASONE SOD PHOSPHATE PF 10 MG/ML IJ SOLN
10.0000 mg | Freq: Once | INTRAMUSCULAR | Status: AC
  Administered 2024-08-26: 10 mg via INTRAVENOUS

## 2024-08-25 MED ORDER — DOCUSATE SODIUM 100 MG PO CAPS: 100.0000 mg | ORAL_CAPSULE | Freq: Two times a day (BID) | ORAL | 2 refills | Status: AC

## 2024-08-25 MED ORDER — LIDOCAINE HCL (CARDIAC) PF 100 MG/5ML IV SOSY
PREFILLED_SYRINGE | INTRAVENOUS | Status: DC | PRN
  Administered 2024-08-25: 20 mg via INTRAVENOUS

## 2024-08-25 MED ORDER — OXYCODONE HCL 5 MG PO TABS
ORAL_TABLET | ORAL | Status: AC
  Filled 2024-08-25: qty 1

## 2024-08-25 MED ORDER — CEFAZOLIN SODIUM-DEXTROSE 2-4 GM/100ML-% IV SOLN
2.0000 g | Freq: Four times a day (QID) | INTRAVENOUS | Status: AC
  Administered 2024-08-25 (×2): 2 g via INTRAVENOUS
  Filled 2024-08-25 (×2): qty 100

## 2024-08-25 MED ORDER — FLEET ENEMA RE ENEM: 1.0000 | ENEMA | Freq: Once | RECTAL | Status: DC | PRN

## 2024-08-25 MED ORDER — ISOSORBIDE MONONITRATE ER 60 MG PO TB24
60.0000 mg | ORAL_TABLET | Freq: Every day | ORAL | Status: DC
  Administered 2024-08-25 – 2024-08-27 (×3): 60 mg via ORAL
  Filled 2024-08-25 (×3): qty 1

## 2024-08-25 MED ORDER — FUROSEMIDE 40 MG PO TABS
40.0000 mg | ORAL_TABLET | Freq: Every day | ORAL | Status: DC
  Administered 2024-08-25 – 2024-08-27 (×3): 40 mg via ORAL
  Filled 2024-08-25 (×3): qty 1

## 2024-08-25 MED ORDER — FENTANYL CITRATE (PF) 50 MCG/ML IJ SOSY: 25.0000 ug | PREFILLED_SYRINGE | INTRAMUSCULAR | Status: DC | PRN

## 2024-08-25 MED ORDER — METOCLOPRAMIDE HCL 5 MG/ML IJ SOLN: 5.0000 mg | Freq: Three times a day (TID) | INTRAMUSCULAR | Status: DC | PRN

## 2024-08-25 MED ORDER — KCL IN DEXTROSE-NACL 20-5-0.45 MEQ/L-%-% IV SOLN
INTRAVENOUS | Status: DC
  Filled 2024-08-25 (×2): qty 1000

## 2024-08-25 MED ORDER — BISACODYL 5 MG PO TBEC: 5.0000 mg | DELAYED_RELEASE_TABLET | Freq: Every day | ORAL | Status: DC | PRN

## 2024-08-25 MED ORDER — METHOCARBAMOL 1000 MG/10ML IJ SOLN: 500.0000 mg | Freq: Four times a day (QID) | INTRAMUSCULAR | Status: DC | PRN

## 2024-08-25 MED ORDER — BUPIVACAINE-EPINEPHRINE (PF) 0.25% -1:200000 IJ SOLN
INTRAMUSCULAR | Status: AC
  Filled 2024-08-25: qty 30

## 2024-08-25 MED ORDER — OXYCODONE HCL 5 MG PO TABS
5.0000 mg | ORAL_TABLET | ORAL | Status: DC | PRN
  Administered 2024-08-25: 5 mg via ORAL
  Administered 2024-08-25 – 2024-08-27 (×5): 10 mg via ORAL
  Filled 2024-08-25 (×5): qty 2
  Filled 2024-08-25: qty 1
  Filled 2024-08-25: qty 2

## 2024-08-25 MED ORDER — PROPOFOL 500 MG/50ML IV EMUL
INTRAVENOUS | Status: DC | PRN
  Administered 2024-08-25: 50 ug/kg/min via INTRAVENOUS

## 2024-08-25 MED ORDER — TIZANIDINE HCL 2 MG PO TABS: 2.0000 mg | ORAL_TABLET | Freq: Four times a day (QID) | ORAL | 0 refills | Status: AC | PRN

## 2024-08-25 MED ORDER — OXYCODONE HCL 5 MG/5ML PO SOLN: 5.0000 mg | Freq: Once | ORAL | Status: AC | PRN

## 2024-08-25 MED ORDER — EPHEDRINE SULFATE (PRESSORS) 25 MG/5ML IV SOSY
PREFILLED_SYRINGE | INTRAVENOUS | Status: DC | PRN
  Administered 2024-08-25 (×2): 5 mg via INTRAVENOUS

## 2024-08-25 MED ORDER — ALBUMIN HUMAN 5 % IV SOLN: INTRAVENOUS | Status: DC | PRN

## 2024-08-25 MED ORDER — OXYCODONE-ACETAMINOPHEN 5-325 MG PO TABS: 1.0000 | ORAL_TABLET | ORAL | 0 refills | Status: AC | PRN

## 2024-08-25 MED ORDER — PHENOL 1.4 % MT LIQD: 1.0000 | OROMUCOSAL | Status: DC | PRN

## 2024-08-25 MED ORDER — MIDAZOLAM HCL 2 MG/2ML IJ SOLN
INTRAMUSCULAR | Status: AC
  Filled 2024-08-25: qty 2

## 2024-08-25 MED ORDER — MENTHOL 3 MG MT LOZG: 1.0000 | LOZENGE | OROMUCOSAL | Status: DC | PRN

## 2024-08-25 MED ORDER — PHENYLEPHRINE HCL-NACL 20-0.9 MG/250ML-% IV SOLN
INTRAVENOUS | Status: DC | PRN
  Administered 2024-08-25: 30 ug/min via INTRAVENOUS

## 2024-08-25 MED ORDER — DIPHENHYDRAMINE HCL 12.5 MG/5ML PO ELIX: 12.5000 mg | ORAL_SOLUTION | ORAL | Status: DC | PRN

## 2024-08-25 MED ORDER — ORAL CARE MOUTH RINSE: 15.0000 mL | Freq: Once | OROMUCOSAL | Status: AC

## 2024-08-25 MED ORDER — TRANEXAMIC ACID-NACL 1000-0.7 MG/100ML-% IV SOLN
1000.0000 mg | Freq: Once | INTRAVENOUS | Status: AC
  Administered 2024-08-25: 1000 mg via INTRAVENOUS
  Filled 2024-08-25: qty 100

## 2024-08-25 MED ORDER — ONDANSETRON HCL 4 MG/2ML IJ SOLN: 4.0000 mg | Freq: Once | INTRAMUSCULAR | Status: DC | PRN

## 2024-08-25 MED ORDER — CHLORHEXIDINE GLUCONATE 0.12 % MT SOLN
15.0000 mL | Freq: Once | OROMUCOSAL | Status: AC
  Administered 2024-08-25: 15 mL via OROMUCOSAL

## 2024-08-25 MED ORDER — ACETAMINOPHEN 325 MG PO TABS
325.0000 mg | ORAL_TABLET | Freq: Four times a day (QID) | ORAL | Status: DC | PRN
  Administered 2024-08-27 (×2): 650 mg via ORAL
  Filled 2024-08-25 (×2): qty 2

## 2024-08-25 MED ORDER — ALBUTEROL SULFATE (2.5 MG/3ML) 0.083% IN NEBU: 2.5000 mg | INHALATION_SOLUTION | RESPIRATORY_TRACT | Status: DC | PRN

## 2024-08-25 MED ORDER — PRAMIPEXOLE DIHYDROCHLORIDE 0.25 MG PO TABS
0.1250 mg | ORAL_TABLET | Freq: Every day | ORAL | Status: DC
  Administered 2024-08-25 – 2024-08-26 (×2): 0.125 mg via ORAL
  Filled 2024-08-25 (×2): qty 1

## 2024-08-25 MED ORDER — FENTANYL CITRATE (PF) 100 MCG/2ML IJ SOLN
INTRAMUSCULAR | Status: AC
  Filled 2024-08-25: qty 2

## 2024-08-25 MED ORDER — PANTOPRAZOLE SODIUM 40 MG PO TBEC
40.0000 mg | DELAYED_RELEASE_TABLET | Freq: Every day | ORAL | Status: DC
  Administered 2024-08-25 – 2024-08-27 (×3): 40 mg via ORAL
  Filled 2024-08-25 (×3): qty 1

## 2024-08-25 MED ORDER — BUPIVACAINE LIPOSOME 1.3 % IJ SUSP: 10.0000 mL | Freq: Once | INTRAMUSCULAR | Status: DC

## 2024-08-25 MED ORDER — MIDAZOLAM HCL 5 MG/5ML IJ SOLN
INTRAMUSCULAR | Status: DC | PRN
  Administered 2024-08-25 (×2): 1 mg via INTRAVENOUS

## 2024-08-25 MED ORDER — NITROGLYCERIN 0.4 MG SL SUBL: 0.4000 mg | SUBLINGUAL_TABLET | SUBLINGUAL | Status: DC | PRN

## 2024-08-25 SURGICAL SUPPLY — 42 items
BAG COUNTER SPONGE SURGICOUNT (BAG) IMPLANT
BAG ZIPLOCK 12X15 (MISCELLANEOUS) IMPLANT
BENZOIN TINCTURE PRP APPL 2/3 (GAUZE/BANDAGES/DRESSINGS) IMPLANT
BLADE SAW SGTL 18X1.27X75 (BLADE) ×1 IMPLANT
COVER PERINEAL POST (MISCELLANEOUS) ×1 IMPLANT
COVER SURGICAL LIGHT HANDLE (MISCELLANEOUS) ×1 IMPLANT
CUP PINN GRIPTON 58 100 (Cup) IMPLANT
DRAPE FOOT SWITCH (DRAPES) ×1 IMPLANT
DRAPE STERI IOBAN 125X83 (DRAPES) ×1 IMPLANT
DRAPE U-SHAPE 47X51 STRL (DRAPES) ×2 IMPLANT
DRESSING AQUACEL AG SP 3.5X6 (GAUZE/BANDAGES/DRESSINGS) IMPLANT
DRSG AQUACEL AG ADV 3.5X 6 (GAUZE/BANDAGES/DRESSINGS) ×1 IMPLANT
DURAPREP 26ML APPLICATOR (WOUND CARE) ×1 IMPLANT
ELECT PENCIL ROCKER SW 15FT (MISCELLANEOUS) ×1 IMPLANT
ELECT REM PT RETURN 15FT ADLT (MISCELLANEOUS) ×1 IMPLANT
ELIMINATOR HOLE APEX DEPUY (Hips) IMPLANT
FEMORAL STEM 12/14 TPR SZ4 HIP (Orthopedic Implant) IMPLANT
GAUZE XEROFORM 1X8 LF (GAUZE/BANDAGES/DRESSINGS) IMPLANT
GLOVE BIOGEL PI IND STRL 8 (GLOVE) ×2 IMPLANT
GLOVE BIOGEL PI IND STRL 9 (GLOVE) IMPLANT
GLOVE ECLIPSE 7.5 STRL STRAW (GLOVE) ×2 IMPLANT
GLOVE ECLIPSE 8.5 STRL (GLOVE) IMPLANT
GOWN STRL REUS W/ TWL XL LVL3 (GOWN DISPOSABLE) ×2 IMPLANT
HEAD CERAMIC 36 PLUS 8.5 12 14 (Hips) IMPLANT
HOLDER FOLEY CATH W/STRAP (MISCELLANEOUS) ×1 IMPLANT
HOOD PEEL AWAY T7 (MISCELLANEOUS) ×3 IMPLANT
KIT TURNOVER KIT A (KITS) ×1 IMPLANT
LINER NEUTRAL 36X58 PLUS4 IMPLANT
NDL HYPO 22X1.5 SAFETY MO (MISCELLANEOUS) ×2 IMPLANT
PACK ANTERIOR HIP CUSTOM (KITS) ×1 IMPLANT
SPIKE FLUID TRANSFER (MISCELLANEOUS) ×1 IMPLANT
STAPLER SKIN PROX 35W (STAPLE) IMPLANT
STRIP CLOSURE SKIN 1/2X4 (GAUZE/BANDAGES/DRESSINGS) IMPLANT
SUT ETHIBOND NAB CT1 #1 30IN (SUTURE) ×2 IMPLANT
SUT MNCRL AB 3-0 PS2 18 (SUTURE) IMPLANT
SUT VIC AB 0 CT1 36 (SUTURE) ×1 IMPLANT
SUT VIC AB 1 CT1 36 (SUTURE) ×1 IMPLANT
SUT VIC AB 2-0 CT1 TAPERPNT 27 (SUTURE) ×1 IMPLANT
SUT VICRYL+ 3-0 36IN CT-1 (SUTURE) IMPLANT
TRAY CATH INTERMITTENT SS 16FR (CATHETERS) IMPLANT
TRAY FOLEY MTR SLVR 16FR STAT (SET/KITS/TRAYS/PACK) IMPLANT
TUBE SUCTION HIGH CAP CLEAR NV (SUCTIONS) ×1 IMPLANT

## 2024-08-25 NOTE — Transfer of Care (Signed)
 Immediate Anesthesia Transfer of Care Note  Patient: [name] [name]  Procedure(s) Performed: ARTHROPLASTY, HIP, TOTAL, ANTERIOR APPROACH (Left: Hip)  Patient Location: PACU  Anesthesia Type:Spinal  Level of Consciousness: awake, alert , oriented, and patient cooperative  Airway & Oxygen Therapy: Patient Spontanous Breathing and Patient connected to face mask oxygen  Post-op Assessment: Report given to RN and Post -op Vital signs reviewed and stable  Post vital signs: Reviewed and stable  Last Vitals:  Vitals Value Taken Time  BP    Temp    Pulse 50 08/25/24 12:20  Resp 12 08/25/24 12:20  SpO2 100 % 08/25/24 12:20  Vitals shown include unfiled device data.  Last Pain:  Vitals:   08/25/24 0811  TempSrc: Oral  PainSc: 0-No pain         Complications: No notable events documented.

## 2024-08-25 NOTE — Anesthesia Postprocedure Evaluation (Signed)
 Anesthesia Post Note  Patient: [name] [name]  Procedure(s) Performed: ARTHROPLASTY, HIP, TOTAL, ANTERIOR APPROACH (Left: Hip)     Patient location during evaluation: PACU Anesthesia Type: MAC Level of consciousness: awake and alert Pain management: pain level controlled Vital Signs Assessment: post-procedure vital signs reviewed and stable Respiratory status: spontaneous breathing, nonlabored ventilation, respiratory function stable and patient connected to nasal cannula oxygen Cardiovascular status: stable and blood pressure returned to baseline Postop Assessment: no apparent nausea or vomiting Anesthetic complications: no   No notable events documented.  Last Vitals:  Vitals:   08/25/24 1315 08/25/24 1357  BP: (!) 128/90 124/70  Pulse: (!) 56 62  Resp: 13 16  Temp:  36.5 C  SpO2: 99% 97%    Last Pain:  Vitals:   08/25/24 1357  TempSrc: Oral  PainSc: 2                  Kimiya Brunelle

## 2024-08-25 NOTE — Progress Notes (Signed)
 Orthopedic Tech Progress Note Patient Details:  [name] [name] 1951-05-24 [id number]  Patient ID: [name] [name], male   DOB: 1951-08-14, 73 y.o.   MRN: [id number] Pt doesn't meet criteria for ohf. Pt must be under 70 to get ohf. Chandra Dorn PARAS 08/25/2024, 8:24 PM

## 2024-08-25 NOTE — Interval H&P Note (Signed)
 History and Physical Interval Note:  08/25/2024 9:02 AM  [name] [name]  has presented today for surgery, with the diagnosis of LEFT HIP DEGENERATIVE JOINT DISEASE.  The various methods of treatment have been discussed with the patient and family. After consideration of risks, benefits and other options for treatment, the patient has consented to  Procedure(s): ARTHROPLASTY, HIP, TOTAL, ANTERIOR APPROACH (Left) as a surgical intervention.  The patient's history has been reviewed, patient examined, no change in status, stable for surgery.  I have reviewed the patient's chart and labs.  Questions were answered to the patient's satisfaction.     [name]

## 2024-08-25 NOTE — Discharge Instructions (Signed)

## 2024-08-25 NOTE — Evaluation (Signed)
 Physical Therapy Evaluation Patient Details Name: [name] [name] MRN: [id number] DOB: 06-26-1951 Today's Date: 08/25/2024  History of Present Illness  73 yo male presents to therapy s/p L THA, anterior approach on 08/25/2024 due to failure of conservative measures. Pt is currently R LE WBAT with anterior hip precautions-- no hyperextension of hip, no external rotation and no abduction. Pt PMH includes but is not limited to: anemia, arthritis, HLD, HTN, MI, sickle cell trait, cardiac surgeries and R THA, anterior approach on 05/12/2024.  Clinical Impression    [name] [name] is a 73 y.o. male POD 0 s/p L THA. Patient reports mod I with mobility at baseline. Patient is now limited by functional impairments (see PT problem list below) and requires CGA for bed mobility and CGA for transfers. Patient was able to ambulate 60 feet with RW and CGA level of assist. Patient instructed in exercise to facilitate ROM and circulation to manage edema. Patient will benefit from continued skilled PT interventions to address impairments and progress towards PLOF. Acute PT will follow to progress mobility and stair training in preparation for safe discharge home with family support and HEP only.       If plan is discharge home, recommend the following: A little help with walking and/or transfers;A little help with bathing/dressing/bathroom;Assistance with cooking/housework;Assist for transportation;Help with stairs or ramp for entrance   Can travel by private vehicle        Equipment Recommendations None recommended by PT  Recommendations for Other Services       Functional Status Assessment Patient has had a recent decline in their functional status and demonstrates the ability to make significant improvements in function in a reasonable and predictable amount of time.     Precautions / Restrictions Precautions Precautions: Fall;Anterior Hip Restrictions Weight Bearing Restrictions Per Provider Order:  No LLE Weight Bearing Per Provider Order: Weight bearing as tolerated      Mobility  Bed Mobility Overal bed mobility: Needs Assistance Bed Mobility: Supine to Sit     Supine to sit: Contact guard, HOB elevated     General bed mobility comments: min cues, pt able to come to long sit then turn to EOB    Transfers Overall transfer level: Needs assistance Equipment used: Rolling walker (2 wheels) Transfers: Sit to/from Stand Sit to Stand: Contact guard assist           General transfer comment: min cues    Ambulation/Gait Ambulation/Gait assistance: Contact guard assist Gait Distance (Feet): 60 Feet Assistive device: Rolling walker (2 wheels) Gait Pattern/deviations: Step-to pattern, Decreased stance time - left, Antalgic, Trunk flexed Gait velocity: decreased     General Gait Details: slight trunk flexion with B UE support at RW to offload L LE when in stance phase, min cues for safety and RW management  Stairs            Wheelchair Mobility     Tilt Bed    Modified Rankin (Stroke Patients Only)       Balance Overall balance assessment: Needs assistance Sitting-balance support: Feet supported Sitting balance-Leahy Scale: Good     Standing balance support: Bilateral upper extremity supported, During functional activity, Reliant on assistive device for balance Standing balance-Leahy Scale: Poor                               Pertinent Vitals/Pain Pain Assessment Pain Assessment: 0-10 Pain Score: 5  Pain Location: L LE  and hip Pain Descriptors / Indicators: Aching, Constant, Discomfort, Dull, Grimacing, Operative site guarding Pain Intervention(s): Limited activity within patient's tolerance, Monitored during session, Premedicated before session, Ice applied, Repositioned    Home Living Family/patient expects to be discharged to:: Private residence Living Arrangements: Children Available Help at Discharge: Family;Friend(s) Type of  Home: House Home Access: Stairs to enter Entrance Stairs-Rails: Doctor, General Practice of Steps: 8 Alternate Level Stairs-Number of Steps: 13 Home Layout: Two level;Bed/bath upstairs Home Equipment: Cane - single point;Shower Counsellor (2 wheels);Hospital bed (hospital bed on first floor)      Prior Function Prior Level of Function : Independent/Modified Independent;Driving             Mobility Comments: mod I with use of SPC for all ADLs, self care tasks and IADLs       Extremity/Trunk Assessment        Lower Extremity Assessment Lower Extremity Assessment: LLE deficits/detail LLE Deficits / Details: ankle DF/PF 5/5 LLE Sensation: WNL    Cervical / Trunk Assessment Cervical / Trunk Assessment: Normal  Communication   Communication Communication: No apparent difficulties    Cognition Arousal: Alert Behavior During Therapy: WFL for tasks assessed/performed   PT - Cognitive impairments: No apparent impairments                         Following commands: Intact       Cueing       General Comments      Exercises Total Joint Exercises Ankle Circles/Pumps: AROM, Both, 10 reps   Assessment/Plan    PT Assessment Patient needs continued PT services  PT Problem List Decreased strength;Decreased range of motion;Decreased balance;Decreased activity tolerance;Decreased mobility;Decreased coordination;Pain       PT Treatment Interventions DME instruction;Gait training;Stair training;Functional mobility training;Therapeutic activities;Therapeutic exercise;Balance training;Neuromuscular re-education;Patient/family education;Modalities    PT Goals (Current goals can be found in the Care Plan section)  Acute Rehab PT Goals Patient Stated Goal: to be able to walk more erect and no pain PT Goal Formulation: With patient Time For Goal Achievement: 09/08/24 Potential to Achieve Goals: Good    Frequency 7X/week     Co-evaluation                AM-PAC PT 6 Clicks Mobility  Outcome Measure Help needed turning from your back to your side while in a flat bed without using bedrails?: None Help needed moving from lying on your back to sitting on the side of a flat bed without using bedrails?: A Little Help needed moving to and from a bed to a chair (including a wheelchair)?: A Little Help needed standing up from a chair using your arms (e.g., wheelchair or bedside chair)?: A Little Help needed to walk in hospital room?: A Little Help needed climbing 3-5 steps with a railing? : A Lot 6 Click Score: 18    End of Session Equipment Utilized During Treatment: Gait belt Activity Tolerance: Patient tolerated treatment well;No increased pain Patient left: in chair;with call bell/phone within reach Nurse Communication: Mobility status PT Visit Diagnosis: Unsteadiness on feet (R26.81);Other abnormalities of gait and mobility (R26.89);Muscle weakness (generalized) (M62.81);Difficulty in walking, not elsewhere classified (R26.2);Pain Pain - Right/Left: Left Pain - part of body: Leg;Hip    Time: 8468-8452 PT Time Calculation (min) (ACUTE ONLY): 16 min   Charges:   PT Evaluation $PT Eval Low Complexity: 1 Low   PT General Charges $$ ACUTE PT VISIT: 1 Visit  [place], PT Acute Rehab   [place] VEAR Drone 08/25/2024, 6:18 PM

## 2024-08-25 NOTE — Anesthesia Procedure Notes (Signed)
 Spinal  Patient location during procedure: OR Start time: 08/25/2024 10:36 AM End time: 08/25/2024 10:40 AM Reason for block: surgical anesthesia Staffing Performed: other anesthesia staff  Anesthesiologist: Mallory Manus, MD Performed by: Mallory Manus, MD Authorized by: Mallory Manus, MD   Preanesthetic Checklist Completed: patient identified, IV checked, site marked, risks and benefits discussed, surgical consent, monitors and equipment checked, pre-op evaluation and timeout performed Spinal Block Patient position: sitting Prep: DuraPrep Patient monitoring: heart rate, cardiac monitor, continuous pulse ox and blood pressure Approach: midline Location: L3-4 Injection technique: single-shot Needle Needle type: Sprotte  Needle gauge: 22 G Needle length: 9 cm Assessment Sensory level: T4 Events: CSF return Additional Notes Difficulty with initial placement.   Changed to 22g cutting at same level due to continuous osso

## 2024-08-26 ENCOUNTER — Encounter (HOSPITAL_COMMUNITY): Payer: Self-pay | Admitting: Orthopedic Surgery

## 2024-08-26 ENCOUNTER — Other Ambulatory Visit (HOSPITAL_COMMUNITY): Payer: Self-pay

## 2024-08-26 NOTE — Plan of Care (Signed)
  Problem: Elimination: Goal: Will not experience complications related to bowel motility Outcome: Progressing Goal: Will not experience complications related to urinary retention Outcome: Progressing   Problem: Pain Managment: Goal: General experience of comfort will improve and/or be controlled Outcome: Progressing   Problem: Safety: Goal: Ability to remain free from injury will improve Outcome: Progressing   Problem: Skin Integrity: Goal: Risk for impaired skin integrity will decrease Outcome: Progressing   Problem: Clinical Measurements: Goal: Postoperative complications will be avoided or minimized Outcome: Progressing   Problem: Pain Management: Goal: Pain level will decrease with appropriate interventions Outcome: Progressing

## 2024-08-26 NOTE — Progress Notes (Signed)
 Physical Therapy Treatment Patient Details Name: [name] [name] MRN: [id number] DOB: June 29, 1951 Today's Date: 08/26/2024   History of Present Illness 73 yo male presents to therapy s/p R THA, anterior approach on 08/25/2024 due to failure of conservative measures. Pt is currently R LE WBAT with anterior hip precautions-- no hyperextension of hip, no external rotation and no abduction. Pt PMH includes but is not limited to: anemia, arthritis, HLD, HTN, MI, sickle cell trait, cardiac surgeries and R THA, anterior approach on 05/12/2024.    PT Comments   The patient reports feeling increased discomfort on   left hip. Patient presents with unsteady gait and having more difficulty  with WB on the left. Patient    did not  ambulate as far as  first visit 12/8. The patient will be going to his home which has 7 steps to enter. Patient unable to tolerate attempts on steps at this time. Will  attempt steps in PM  and determine if safe to DC home   this day.   If plan is discharge home, recommend the following: A little help with walking and/or transfers;A little help with bathing/dressing/bathroom;Assistance with cooking/housework;Assist for transportation;Help with stairs or ramp for entrance   Can travel by private vehicle        Equipment Recommendations  None recommended by PT    Recommendations for Other Services       Precautions / Restrictions Precautions Precautions: Fall;Anterior Hip Precaution/Restrictions Comments: no ABD, no hyperextension, no external rotation on  Left Restrictions LLE Weight Bearing Per Provider Order: Weight bearing as tolerated     Mobility  Bed Mobility Overal bed mobility: Needs Assistance Bed Mobility: Supine to Sit     Supine to sit: Contact guard, HOB elevated     General bed mobility comments: pt able to come to long sit then turn to EOB, cues for precautions    Transfers Overall transfer level: Needs assistance Equipment used: Rolling walker (2  wheels) Transfers: Sit to/from Stand Sit to Stand: Min assist           General transfer comment: steady assist, bed raised, cues for hand placement to stand    Ambulation/Gait Ambulation/Gait assistance: Min assist Gait Distance (Feet): 40 Feet Assistive device: Rolling walker (2 wheels) Gait Pattern/deviations: Step-to pattern, Trunk flexed, Decreased step length - left Gait velocity: decreased     General Gait Details: slight trunk flexion with B UE support at RW to offload L LE, cues to stay inside RW, patient mobilized slowly, required min support for safety   Stairs             Wheelchair Mobility     Tilt Bed    Modified Rankin (Stroke Patients Only)       Balance Overall balance assessment: Needs assistance Sitting-balance support: Feet supported Sitting balance-Leahy Scale: Good     Standing balance support: Bilateral upper extremity supported, During functional activity, Reliant on assistive device for balance Standing balance-Leahy Scale: Poor                              Communication Communication Communication: No apparent difficulties  Cognition Arousal: Alert Behavior During Therapy: WFL for tasks assessed/performed   PT - Cognitive impairments: No apparent impairments                         Following commands: Intact      Cueing  Exercises      General Comments        Pertinent Vitals/Pain Pain Assessment Pain Location: L LE and hip Pain Descriptors / Indicators: Aching, Constant, Discomfort, Dull, Grimacing, Operative site guarding Pain Intervention(s): Monitored during session, Premedicated before session, Limited activity within patient's tolerance, Ice applied    Home Living                          Prior Function            PT Goals (current goals can now be found in the care plan section) Progress towards PT goals: Progressing toward goals    Frequency    7X/week       PT Plan      Co-evaluation              AM-PAC PT 6 Clicks Mobility   Outcome Measure  Help needed turning from your back to your side while in a flat bed without using bedrails?: None Help needed moving from lying on your back to sitting on the side of a flat bed without using bedrails?: A Little Help needed moving to and from a bed to a chair (including a wheelchair)?: A Little Help needed standing up from a chair using your arms (e.g., wheelchair or bedside chair)?: A Little Help needed to walk in hospital room?: A Lot Help needed climbing 3-5 steps with a railing? : A Lot 6 Click Score: 17    End of Session Equipment Utilized During Treatment: Gait belt Activity Tolerance: Patient limited by fatigue;Patient limited by pain Patient left: in chair;with call bell/phone within reach;with chair alarm set Nurse Communication: Mobility status PT Visit Diagnosis: Unsteadiness on feet (R26.81);Other abnormalities of gait and mobility (R26.89);Muscle weakness (generalized) (M62.81);Difficulty in walking, not elsewhere classified (R26.2);Pain Pain - Right/Left: Left Pain - part of body: Leg;Hip     Time: 9067-9044 PT Time Calculation (min) (ACUTE ONLY): 23 min  Charges:    $Gait Training: 23-37 mins PT General Charges $$ ACUTE PT VISIT: 1 Visit                     [name] PT Acute Rehabilitation Services Office [phone]    Potters Darice Norris 08/26/2024, 12:55 PM

## 2024-08-26 NOTE — Progress Notes (Signed)
 PATIENT ID: [name] [name]  MRN: [id number]  DOB/AGE:  May 03, 1951 / 73 y.o.  1 Day Post-Op Procedure(s) (LRB): ARTHROPLASTY, HIP, TOTAL, ANTERIOR APPROACH (Left)    PROGRESS NOTE Subjective: Patient is alert, oriented, no Nausea, no Vomiting, yes passing gas, . Taking PO well. Denies SOB, Chest or Calf Pain. Using Incentive Spirometer, PAS in place. Ambulate WBAT with Pt walking 60 ft with therapy, Yesterday's total administered Morphine  Milligram Equivalents: 60), Patient reports pain as  mild to moderate .    Objective: Vital signs in last 24 hours: Vitals:   08/25/24 1640 08/25/24 1806 08/25/24 2236 08/26/24 0643  BP: 127/60 127/71 130/77 136/72  Pulse: 83 80 68 75  Resp: 17 16 16 16   Temp: 98.5 F (36.9 C) 98.9 F (37.2 C) 97.7 F (36.5 C) 98 F (36.7 C)  TempSrc: Oral Oral Oral   SpO2: 100% 100% 97% 95%  Weight:      Height:          Intake/Output from previous day: I/O last 3 completed shifts: In: 3527.2 [P.O.:840; I.V.:2237.2; IV Piggyback:450] Out: 4050 [Urine:3700; Blood:350]   Intake/Output this shift: No intake/output data recorded.   LABORATORY DATA: No results for input(s): WBC, HGB, HCT, PLT, NA, K, CL, CO2, BUN, CREATININE, GLUCOSE, GLUCAP, INR, CALCIUM  in the last 72 hours.  Invalid input(s): PT, 2  Examination: Neurologically intact Neurovascular intact Sensation intact distally Intact pulses distally Dorsiflexion/Plantar flexion intact Incision: dressing C/D/I and no drainage No cellulitis present Compartment soft} XR AP&Lat of hip shows well placed\fixed THA  Assessment:   1 Day Post-Op Procedure(s) (LRB): ARTHROPLASTY, HIP, TOTAL, ANTERIOR APPROACH (Left) ADDITIONAL DIAGNOSIS:  Expected Acute Blood Loss Anemia, anemia, arthritis, HLD, HTN, MI, sickle cell trait, cardiac surgeries and R THA, anterior approach on 05/12/2024   Patient's anticipated LOS is less than 2 midnights, meeting these requirements: -  Younger than 31 - Lives within 1 hour of care - Has a competent adult at home to recover with post-op recover    Plan: PT/OT WBAT, THA  DVT Prophylaxis: SCDx72 hrs, Xarelto  10 mg  DISCHARGE PLAN: Home  DISCHARGE NEEDS: Walker and 3-in-1 comode seat

## 2024-08-26 NOTE — Progress Notes (Signed)
 Physical Therapy Treatment Patient Details Name: [name] [name] MRN: [id number] DOB: [birth year] Today's Date: 08/26/2024   History of Present Illness 73 yo male presents to therapy s/p R THA, anterior approach on 08/25/2024 due to failure of conservative measures. Pt is currently R LE WBAT with anterior hip precautions-- no hyperextension of hip, no external rotation and no abduction. Pt PMH includes but is not limited to: anemia, arthritis, HLD, HTN, MI, sickle cell trait, cardiac surgeries and R THA, anterior approach on 05/12/2024.    PT Comments  The patient relates that he he is concerned that he cannot ambulate as well as he did POD 0. This PM, Left knee noted buckling at stance and required support at the knee.  Patient reports there is increased discomfort in the  Left hip when the leg tends to  roll out,. Reviewed anterior hip precautions ordered by MD.  Patient was to practice steps this visit but PT felt not safe with Left knee buckling.  Patient has 7 STE. Pt to continue ambulation and step  training next visit.    If plan is discharge home, recommend the following: A little help with walking and/or transfers;A little help with bathing/dressing/bathroom;Assistance with cooking/housework;Assist for transportation;Help with stairs or ramp for entrance   Can travel by private vehicle        Equipment Recommendations  None recommended by PT    Recommendations for Other Services       Precautions / Restrictions Precautions Precautions: Fall;Anterior Hip Precaution/Restrictions Comments: no ABD, no hyperextension, no external rotation on  Left Restrictions LLE Weight Bearing Per Provider Order: Weight bearing as tolerated     Mobility  Bed Mobility Overal bed mobility: Needs Assistance Bed Mobility: Supine to Sit, Sit to Supine     Supine to sit: Min assist Sit to supine: Mod assist   General bed mobility comments: patient demonsttrates increased difficulty controlling  the Left leg, required assistance to move leg to bed edge to the  right. Previously, patient able  to move to sitting with no extra help. Required assistance to place  LLE onto bed, pt. attempted with use of belt but required extra assistance due to reports of pain    Transfers Overall transfer level: Needs assistance Equipment used: Rolling walker (2 wheels) Transfers: Sit to/from Stand Sit to Stand: Min assist           General transfer comment: steady assist, bed raised, cues for hand placement to stand    Ambulation/Gait Ambulation/Gait assistance: Mod assist, Min assist Gait Distance (Feet): 40 Feet Assistive device: Rolling walker (2 wheels) Gait Pattern/deviations: Step-to pattern, Trunk flexed, Decreased step length - left Gait velocity: decreased     General Gait Details: cues to stay inside the Rw for safety, L knee noted to buuckle multiple times, PT supporting at the lleft knee.  patient reports that this ambulation is much different than yesterday   Stairs             Wheelchair Mobility     Tilt Bed    Modified Rankin (Stroke Patients Only)       Balance Overall balance assessment: Needs assistance Sitting-balance support: Feet supported Sitting balance-Leahy Scale: Good     Standing balance support: Bilateral upper extremity supported, During functional activity, Reliant on assistive device for balance Standing balance-Leahy Scale: Poor Standing balance comment: reliant on the UE's  Communication Communication Communication: No apparent difficulties  Cognition Arousal: Alert Behavior During Therapy: WFL for tasks assessed/performed   PT - Cognitive impairments: No apparent impairments                         Following commands: Intact      Cueing    Exercises Total Joint Exercises Heel Slides: AAROM, Left, 5 reps, Supine    General Comments        Pertinent Vitals/Pain Pain  Assessment Pain Score: 6  Pain Location: when left leg turns in,  3 at rest. Pain Descriptors / Indicators: Discomfort, Jabbing Pain Intervention(s): Monitored during session, Ice applied    Home Living                          Prior Function            PT Goals (current goals can now be found in the care plan section) Progress towards PT goals: Not progressing toward goals - comment (L knee/leg bucking at stance)    Frequency    7X/week      PT Plan      Co-evaluation              AM-PAC PT 6 Clicks Mobility   Outcome Measure  Help needed turning from your back to your side while in a flat bed without using bedrails?: None Help needed moving from lying on your back to sitting on the side of a flat bed without using bedrails?: A Little Help needed moving to and from a bed to a chair (including a wheelchair)?: A Lot Help needed standing up from a chair using your arms (e.g., wheelchair or bedside chair)?: A Lot Help needed to walk in hospital room?: A Lot Help needed climbing 3-5 steps with a railing? : A Lot 6 Click Score: 15    End of Session Equipment Utilized During Treatment: Gait belt Activity Tolerance: Patient limited by fatigue;Patient limited by pain Patient left: in bed;with bed alarm set;with call bell/phone within reach Nurse Communication: Mobility status (did not pass PT) PT Visit Diagnosis: Unsteadiness on feet (R26.81);Other abnormalities of gait and mobility (R26.89);Muscle weakness (generalized) (M62.81);Difficulty in walking, not elsewhere classified (R26.2);Pain Pain - Right/Left: Left Pain - part of body: Leg;Hip     Time: 8660-8590 PT Time Calculation (min) (ACUTE ONLY): 30 min  Charges:    $Gait Training: 8-22 mins $Therapeutic Exercise: 8-22 mins PT General Charges $$ ACUTE PT VISIT: 1 Visit                    [name] PT Acute Rehabilitation Services Office [phone]

## 2024-08-26 NOTE — Discharge Summary (Cosign Needed Addendum)
 Patient ID: [name] [name] MRN: [id number] DOB/AGE: 1951/05/25 73 y.o.  Admit date: 08/25/2024 Discharge date: 08/26/2024  Admission Diagnoses:  Principal Problem:   H/O total hip arthroplasty, left   Discharge Diagnoses:  Same  Past Medical History:  Diagnosis Date   Anemia    Arthritis    Carotid artery occlusion    Cataracts, bilateral    COPD (chronic obstructive pulmonary disease) (HCC)    Coronary artery disease    Hyperlipidemia    Hypertension    Myocardial infarction (HCC)    Seasonal allergies    Sickle cell trait     Surgeries: Procedure(s): ARTHROPLASTY, HIP, TOTAL, ANTERIOR APPROACH on 08/25/2024   Consultants:   Discharged Condition: Improved  Hospital Course: OWYN RAULSTON is an 73 y.o. male who was admitted 08/25/2024 for operative treatment ofH/O total hip arthroplasty, left. Patient has severe unremitting pain that affects sleep, daily activities, and work/hobbies. After pre-op clearance the patient was taken to the operating room on 08/25/2024 and underwent  Procedure(s): ARTHROPLASTY, HIP, TOTAL, ANTERIOR APPROACH.    Patient was given perioperative antibiotics:  Anti-infectives (From admission, onward)    Start     Dose/Rate Route Frequency Ordered Stop   08/25/24 1600  ceFAZolin  (ANCEF ) IVPB 2g/100 mL premix        2 g 200 mL/hr over 30 Minutes Intravenous Every 6 hours 08/25/24 1404 08/25/24 2226   08/25/24 0745  ceFAZolin  (ANCEF ) IVPB 2g/100 mL premix        2 g 200 mL/hr over 30 Minutes Intravenous On call to O.R. 08/25/24 9261 08/25/24 1037        Patient was given sequential compression devices, early ambulation, and chemoprophylaxis to prevent DVT.  Inpatient Morphine  Milligram Equivalents Per Day 12/8 - 12/9   Values displayed are in units of MME/Day    Order Start / End Date Yesterday Today    oxyCODONE  (Oxy IR/ROXICODONE ) immediate release tablet 5 mg 12/8 - 12/8 7.5 of Unknown --    oxyCODONE  (ROXICODONE ) 5 MG/5ML solution 5 mg  12/8 - 12/8 0 of Unknown --      Group total: 7.5 of Unknown     fentaNYL  (SUBLIMAZE ) injection 25-50 mcg 12/8 - 12/8 0 of 45-90 --    meperidine  (DEMEROL ) injection 6.25-12.5 mg 12/8 - 12/8 0 of 7.5-15 --    fentaNYL  (SUBLIMAZE ) injection 12/8 - 12/8 *30 of 30 --    HYDROmorphone  (DILAUDID ) injection 0.5-1 mg 12/8 - No end date 0 of 30-60 0 of 60-120    oxyCODONE  (Oxy IR/ROXICODONE ) immediate release tablet 5-10 mg 12/8 - No end date 22.5 of 22.5-45 15 of 45-90    oxyCODONE  (Oxy IR/ROXICODONE ) immediate release tablet 10-15 mg 12/8 - No end date 0 of 45-67.5 15 of 90-135    Daily Totals  * 60 of Unknown (at least 180-307.5) 30 of 195-345  *One-Step medication  Calculation Errors     Order Type Date Details   oxyCODONE  (Oxy IR/ROXICODONE ) immediate release tablet 5 mg Ordered Dose -- Insufficient frequency information   oxyCODONE  (ROXICODONE ) 5 MG/5ML solution 5 mg Ordered Dose -- Insufficient frequency information            Patient benefited maximally from hospital stay and there were no complications.    Recent vital signs: Patient Vitals for the past 24 hrs:  BP Temp Temp src Pulse Resp SpO2  08/26/24 0643 136/72 98 F (36.7 C) -- 75 16 95 %  08/25/24 2236 130/77 97.7 F (36.5 C)  Oral 68 16 97 %  08/25/24 1806 127/71 98.9 F (37.2 C) Oral 80 16 100 %  08/25/24 1640 127/60 98.5 F (36.9 C) Oral 83 17 100 %  08/25/24 1600 -- -- -- -- -- 98 %  08/25/24 1357 124/70 97.7 F (36.5 C) Oral 62 16 97 %  08/25/24 1315 (!) 128/90 -- -- (!) 56 13 99 %  08/25/24 1245 (!) 109/58 -- -- (!) 58 12 99 %  08/25/24 1218 107/63 97.6 F (36.4 C) Oral 60 16 96 %     Recent laboratory studies: No results for input(s): WBC, HGB, HCT, PLT, NA, K, CL, CO2, BUN, CREATININE, GLUCOSE, INR, CALCIUM  in the last 72 hours.  Invalid input(s): PT, 2   Discharge Medications:   Allergies as of 08/26/2024       Reactions   Shellfish Protein-containing Drug Products  Anaphylaxis, Shortness Of Breath, Other (See Comments)   My throat goes numb   Porcine (pork) Protein-containing Drug Products    Does not eat, not allergic        Medication List     STOP taking these medications    oxyCODONE  5 MG immediate release tablet Commonly known as: Oxy IR/ROXICODONE        TAKE these medications    albuterol  108 (90 Base) MCG/ACT inhaler Commonly known as: VENTOLIN  HFA Inhale 2 puffs into the lungs every 4 (four) hours as needed for shortness of breath or wheezing.   amLODipine  10 MG tablet Commonly known as: NORVASC  Take 1 tablet (10 mg total) by mouth daily.   aspirin  EC 81 MG tablet Take 81 mg by mouth daily.   atorvastatin  20 MG tablet Commonly known as: LIPITOR  Take 20 mg by mouth at bedtime.   benzonatate  200 MG capsule Commonly known as: TESSALON  Take 200 mg by mouth 3 (three) times daily as needed for cough.   chlorhexidine  0.12 % solution Commonly known as: PERIDEX  Use as directed 10 mLs in the mouth or throat 2 (two) times daily.   docusate sodium  100 MG capsule Commonly known as: Colace Take 1 capsule (100 mg total) by mouth 2 (two) times daily. What changed: Another medication with the same name was added. Make sure you understand how and when to take each.   docusate sodium  100 MG capsule Commonly known as: Colace Take 1 capsule (100 mg total) by mouth 2 (two) times daily. What changed: You were already taking a medication with the same name, and this prescription was added. Make sure you understand how and when to take each.   finasteride  5 MG tablet Commonly known as: PROSCAR  Take 5 mg by mouth daily.   furosemide  40 MG tablet Commonly known as: LASIX  Take 40 mg by mouth daily.   Fusion Plus Caps Take 1 capsule by mouth daily.   isosorbide  mononitrate 30 MG 24 hr tablet Commonly known as: IMDUR  Take 60 mg by mouth daily.   isosorbide  mononitrate 60 MG 24 hr tablet Commonly known as: IMDUR  Take 60 mg by  mouth daily.   metoprolol  succinate 50 MG 24 hr tablet Commonly known as: TOPROL -XL Take 50 mg by mouth daily. Take with or immediately following a meal.   nitroGLYCERIN  0.4 MG/SPRAY spray Commonly known as: NITROLINGUAL  Place 1 spray under the tongue every 5 (five) minutes as needed for chest pain.   OVER THE COUNTER MEDICATION Apply 1 Application topically in the morning and at bedtime. Horse Liniment Gel   oxyCODONE -acetaminophen  5-325 MG tablet Commonly known as: PERCOCET/ROXICET  Take 1 tablet by mouth every 4 (four) hours as needed for severe pain (pain score 7-10).   pantoprazole  40 MG tablet Commonly known as: PROTONIX  Take 1 tablet (40 mg total) by mouth daily at 6 (six) AM.   Potassium Chloride  ER 20 MEQ Tbcr Take 40 mEq by mouth daily.   pramipexole  0.125 MG tablet Commonly known as: MIRAPEX  Take 0.125 mg by mouth at bedtime.   ranolazine  500 MG 12 hr tablet Commonly known as: RANEXA  Take 500 mg by mouth 2 (two) times daily.   tamsulosin  0.4 MG Caps capsule Commonly known as: FLOMAX  Take 0.8 mg by mouth at bedtime.   tiotropium 18 MCG inhalation capsule Commonly known as: SPIRIVA  Place 18 mcg into inhaler and inhale daily.   tiZANidine  2 MG tablet Commonly known as: ZANAFLEX  Take 1 tablet (2 mg total) by mouth every 8 (eight) hours as needed for muscle spasms. What changed: Another medication with the same name was added. Make sure you understand how and when to take each.   tiZANidine  2 MG tablet Commonly known as: ZANAFLEX  Take 1 tablet (2 mg total) by mouth every 6 (six) hours as needed. What changed: You were already taking a medication with the same name, and this prescription was added. Make sure you understand how and when to take each.   Voltaren  Arthritis Pain 1 % Gel Generic drug: diclofenac  Sodium Apply 2 g topically 3 (three) times daily.   Xarelto  10 MG Tabs tablet Generic drug: rivaroxaban  Take 1 tablet (10 mg total) by mouth daily.                Durable Medical Equipment  (From admission, onward)           Start     Ordered   08/25/24 1405  DME Walker rolling  Once       Question:  Patient needs a walker to treat with the following condition  Answer:  Status post total hip replacement, left   08/25/24 1404   08/25/24 1405  DME 3 n 1  Once        08/25/24 1404              Discharge Care Instructions  (From admission, onward)           Start     Ordered   08/26/24 0000  Weight bearing as tolerated        08/26/24 0817            Diagnostic Studies: DG HIP UNILAT WITH PELVIS 1V LEFT Result Date: 08/25/2024 CLINICAL DATA:  Elective surgery EXAM: DG HIP (WITH OR WITHOUT PELVIS) 1V*L* COMPARISON:  Preoperative imaging FINDINGS: Two fluoroscopic spot views of the pelvis and left hip obtained in the operating room. Sequential images during hip arthroplasty. Fluoroscopy time 8 seconds. Dose 1.4 mGy. IMPRESSION: Intraoperative fluoroscopy during left hip arthroplasty. Electronically Signed   By: Andrea Gasman M.D.   On: 08/25/2024 12:16   DG C-Arm 1-60 Min-No Report Result Date: 08/25/2024 Fluoroscopy was utilized by the requesting physician.  No radiographic interpretation.   DG C-Arm 1-60 Min-No Report Result Date: 08/25/2024 Fluoroscopy was utilized by the requesting physician.  No radiographic interpretation.   VAS US  CAROTID Result Date: 08/07/2024 Carotid Arterial Duplex Study Patient Name:  [name] [name]  Date of Exam:   08/07/2024 Medical Rec #: [id number]        Accession #:    [id number] Date of Birth: 13-Sep-1951  Patient Gender: M Patient Age:   77 years Exam Location:  [address] Procedure:      VAS US  CAROTID Referring Phys: JOSHUA ROBINS --------------------------------------------------------------------------------  Indications:       Carotid occlusions left. Risk Factors:      Hypertension, hyperlipidemia, past history of smoking,                    coronary artery  disease. Comparison Study:  06/21/23                    Right Color duplex indicates minimal heterogeneous plaque,                    with no                    hemodynamically significant stenosis by duplex criteria in                    the                    extracranial cerebrovascular circulation.                     Left:                     Interval development of left common carotid and cervical ICA                    occlusion, indeterminate chronicity. Performing Technologist: Dena Pane  Examination Guidelines: A complete evaluation includes B-mode imaging, spectral Doppler, color Doppler, and power Doppler as needed of all accessible portions of each vessel. Bilateral testing is considered an integral part of a complete examination. Limited examinations for reoccurring indications may be performed as noted.  Right Carotid Findings: +----------+--------+--------+--------+------------------+------------------+           PSV cm/sEDV cm/sStenosisPlaque DescriptionComments           +----------+--------+--------+--------+------------------+------------------+ CCA Prox  94      28                                intimal thickening +----------+--------+--------+--------+------------------+------------------+ CCA Distal67      23                                intimal thickening +----------+--------+--------+--------+------------------+------------------+ ICA Prox  80      20      1-39%   heterogenous                         +----------+--------+--------+--------+------------------+------------------+ ICA Mid   66      17                                                   +----------+--------+--------+--------+------------------+------------------+ ICA Distal87      32                                                   +----------+--------+--------+--------+------------------+------------------+ ECA  103     18                                                    +----------+--------+--------+--------+------------------+------------------+ +----------+--------+-------+----------------+-------------------+           PSV cm/sEDV cmsDescribe        Arm Pressure (mmHG) +----------+--------+-------+----------------+-------------------+ [id number]      7      Multiphasic, [id number]                 +----------+--------+-------+----------------+-------------------+ +---------+--------+--+--------+--+---------+ VertebralPSV cm/s58EDV cm/s12Antegrade +---------+--------+--+--------+--+---------+  Left Carotid Findings: +----------+--------+--------+--------+------------------+--------+           PSV cm/sEDV cm/sStenosisPlaque DescriptionComments +----------+--------+--------+--------+------------------+--------+ CCA Prox                  Occluded                           +----------+--------+--------+--------+------------------+--------+ CCA Mid                   Occluded                           +----------+--------+--------+--------+------------------+--------+ CCA Distal                Occluded                           +----------+--------+--------+--------+------------------+--------+ ICA Prox  15      0                                          +----------+--------+--------+--------+------------------+--------+ ICA Mid   34      6                                          +----------+--------+--------+--------+------------------+--------+ ICA Distal31      3                                          +----------+--------+--------+--------+------------------+--------+ ECA                       Occludedcalcific                   +----------+--------+--------+--------+------------------+--------+ +----------+--------+--------+---------+-------------------+           PSV cm/sEDV cm/sDescribe Arm Pressure (mmHG) +----------+--------+--------+---------+-------------------+ Subclavian208     0        Turbulent152                 +----------+--------+--------+---------+-------------------+ +---------+--------+--+--------+--+---------+ VertebralPSV cm/s79EDV cm/s25Antegrade +---------+--------+--+--------+--+---------+   Summary: Right Carotid: Velocities in the right ICA are consistent with a 1-39% stenosis. Left Carotid: The CCA appears occluded. The ECA appears occluded. Low flow is               visualized in the left ICA. Vertebrals:  Bilateral vertebral arteries demonstrate antegrade flow. Subclavians: Left subclavian artery flow was disturbed. Normal flow hemodynamics  were seen in the right subclavian artery. *See table(s) above for measurements and observations.  Electronically signed by Fonda Rim on 08/07/2024 at 2:05:07 PM.    Final     Disposition: Discharge disposition: 01-Home or Self Care       Discharge Instructions     Call MD / Call 911   Complete by: As directed    If you experience chest pain or shortness of breath, CALL 911 and be transported to the hospital emergency room.  If you develope a fever above 101 F, pus (white drainage) or increased drainage or redness at the wound, or calf pain, call your surgeon's office.   Constipation Prevention   Complete by: As directed    Drink plenty of fluids.  Prune juice may be helpful.  You may use a stool softener, such as Colace (over the counter) 100 mg twice a day.  Use MiraLax  (over the counter) for constipation as needed.   Diet - low sodium heart healthy   Complete by: As directed    Driving restrictions   Complete by: As directed    No driving for 2 weeks   Increase activity slowly as tolerated   Complete by: As directed    Patient may shower   Complete by: As directed    You may shower without a dressing once there is no drainage.  Do not wash over the wound.  If drainage remains, cover wound with plastic wrap and then shower.   Post-operative opioid taper instructions:   Complete by: As  directed    POST-OPERATIVE OPIOID TAPER INSTRUCTIONS: It is important to wean off of your opioid medication as soon as possible. If you do not need pain medication after your surgery it is ok to stop day one. Opioids include: Codeine, Hydrocodone(Norco, Vicodin), Oxycodone (Percocet, oxycontin ) and hydromorphone  amongst others.  Long term and even short term use of opiods can cause: Increased pain response Dependence Constipation Depression Respiratory depression And more.  Withdrawal symptoms can include Flu like symptoms Nausea, vomiting And more Techniques to manage these symptoms Hydrate well Eat regular healthy meals Stay active Use relaxation techniques(deep breathing, meditating, yoga) Do Not substitute Alcohol to help with tapering If you have been on opioids for less than two weeks and do not have pain than it is ok to stop all together.  Plan to wean off of opioids This plan should start within one week post op of your joint replacement. Maintain the same interval or time between taking each dose and first decrease the dose.  Cut the total daily intake of opioids by one tablet each day Next start to increase the time between doses. The last dose that should be eliminated is the evening dose.      Weight bearing as tolerated   Complete by: As directed         Follow-up Information     Yvone Rush, MD Follow up in 2 week(s).   Specialty: Orthopedic Surgery Contact information: [address] [place] [address] [phone]                  Signed: Camellia Ellen 08/26/2024, 8:17 AM

## 2024-08-27 NOTE — Progress Notes (Signed)
 PATIENT ID: [name] [name]  MRN: [id number]  DOB/AGE:  [birth year] / 73 y.o.  2 Days Post-Op Procedure(s) (LRB): ARTHROPLASTY, HIP, TOTAL, ANTERIOR APPROACH (Left)    PROGRESS NOTE Subjective: Patient is alert, oriented, no Nausea, no Vomiting, yes passing gas, . Taking PO well. Denies SOB, Chest or Calf Pain. Using Incentive Spirometer, PAS in place. Ambulate WBAT with pt doing multiple sessions of pt, Yesterday's total administered Morphine  Milligram Equivalents: 75), Patient reports pain as  mild .    Objective: Vital signs in last 24 hours: Vitals:   08/26/24 2226 08/27/24 0704 08/27/24 0849 08/27/24 0959  BP: 135/67 125/65  131/69  Pulse: 63 67  71  Resp:  15  18  Temp: 97.7 F (36.5 C) 97.7 F (36.5 C)  98.7 F (37.1 C)  TempSrc: Oral   Oral  SpO2: 95% 96% 96% 99%  Weight:      Height:          Intake/Output from previous day: I/O last 3 completed shifts: In: 2707.2 [P.O.:1720; I.V.:987.2] Out: 5375 [Urine:5375]   Intake/Output this shift: Total I/O In: -  Out: 200 [Urine:200]   LABORATORY DATA: No results for input(s): WBC, HGB, HCT, PLT, NA, K, CL, CO2, BUN, CREATININE, GLUCOSE, GLUCAP, INR, CALCIUM  in the last 72 hours.  Invalid input(s): PT, 2  Examination: Neurologically intact Neurovascular intact Sensation intact distally Intact pulses distally Dorsiflexion/Plantar flexion intact Incision: dressing C/D/I and no drainage No cellulitis present Compartment soft} XR AP&Lat of hip shows well placed\fixed THA  Assessment:   2 Days Post-Op Procedure(s) (LRB): ARTHROPLASTY, HIP, TOTAL, ANTERIOR APPROACH (Left) ADDITIONAL DIAGNOSIS:  Expected Acute Blood Loss Anemia, arthritis, HLD, HTN, MI, sickle cell trait, cardiac surgeries and R THA, anterior approach on 05/12/2024  Anticipated LOS equal to or greater than 2 midnights due to - Age 78 and older with one or more of the following:  - Obesity  - Expected need for hospital  services (PT, OT, Nursing) required for safe  discharge  - Anticipated need for postoperative skilled nursing care or inpatient rehab  - Active co-morbidities: Chronic pain requiring opiods, Heart Attack, and Sickle cell trait  Plan: PT/OT WBAT, THA  DVT Prophylaxis: SCDx72 hrs, Xarelto  10 mg  DISCHARGE PLAN: Home, today

## 2024-08-27 NOTE — TOC Initial Note (Signed)
 Transition of Care [facility]) - Initial/Assessment Note    Patient Details  Name: [name] [name] MRN: [id number] Date of Birth: May 05, 1951  Transition of Care [facility]) CM/SW Contact:    NORMAN ASPEN, LCSW Phone Number: 08/27/2024, 11:06 AM  Clinical Narrative:                  Met with pt both yesterday and today.  Pt aware therapy follow up plan is for OPPT [facility] location), however, is now having increased concerns about home discharge.  Pt reports that his daughter is unable to assist due to other caregiver obligations and son, who lives in the home, is not reliable caregiver.  He initially noted concern about being able to get to OP appointments but, today, he is asking about option for SNF rehab. Have alerted Arvella Fireman, PA to pt's concerns and PA would prefer continuing to aim for home dc.   Notes may need to remain in hospital a few extra days to meet home dc goal.  Have made RN/ PT and patient aware.  Expected Discharge Plan: OP Rehab Barriers to Discharge: Continued Medical Work up   Patient Goals and CMS Choice Patient states their goals for this hospitalization and ongoing recovery are:: return home          Expected Discharge Plan and Services In-house Referral: Clinical Social Work     Living arrangements for the past 2 months: Single Family Home Expected Discharge Date: 08/27/24               DME Arranged: N/A DME Agency: NA                  Prior Living Arrangements/Services Living arrangements for the past 2 months: Single Family Home Lives with:: Adult Children Patient language and need for interpreter reviewed:: Yes        Need for Family Participation in Patient Care: Yes (Comment) Care giver support system in place?: No (comment)   Criminal Activity/Legal Involvement Pertinent to Current Situation/Hospitalization: No - Comment as needed  Activities of Daily Living   ADL Screening (condition at time of admission) Independently performs ADLs?:  Yes (appropriate for developmental age) Is the patient deaf or have difficulty hearing?: No Does the patient have difficulty seeing, even when wearing glasses/contacts?: No Does the patient have difficulty concentrating, remembering, or making decisions?: Yes  Permission Sought/Granted Permission sought to share information with : Family Supports Permission granted to share information with : Yes, Verbal Permission Granted  Share Information with NAME: daughter, Geni Kitty [phone]           Emotional Assessment Appearance:: Appears stated age Attitude/Demeanor/Rapport: Engaged Affect (typically observed): Accepting Orientation: : Oriented to Self, Oriented to Place, Oriented to Situation, Oriented to  Time Alcohol / Substance Use: Not Applicable Psych Involvement: No (comment)  Admission diagnosis:  H/O total hip arthroplasty, left [S03.357] Patient Active Problem List   Diagnosis Date Noted   H/O total hip arthroplasty, left 08/25/2024   Primary osteoarthritis of right hip 05/11/2024   NSTEMI (non-ST elevated myocardial infarction) (HCC) 11/16/2022   Essential hypertension 11/16/2022   Hyperlipidemia 11/16/2022   Tobacco abuse 11/16/2022   Hypokalemia 11/16/2022   Coronary artery disease involving native coronary artery of native heart with unstable angina pectoris (HCC) 11/16/2022   Coronary arteriosclerosis after percutaneous transluminal coronary angioplasty (PTCA) 11/16/2022   Acute coronary syndrome (HCC) 03/28/2019   PCP:  Roanna Ezekiel NOVAK, MD Pharmacy:   [phone] - [place], [place] -  [address] AT [facility] [address] [place] [address] Phone: [phone] Fax: [phone]     Social Drivers of Health (SDOH) Social History: SDOH Screenings   Food Insecurity: No Food Insecurity (08/25/2024)  Housing: Low Risk  (08/25/2024)  Transportation Needs: No Transportation Needs (08/25/2024)  Utilities: Not At Risk  (08/25/2024)  Social Connections: Unknown (08/25/2024)  Tobacco Use: Medium Risk (08/25/2024)   SDOH Interventions:     Readmission Risk Interventions     No data to display

## 2024-08-27 NOTE — Progress Notes (Signed)
 Physical Therapy Treatment Patient Details Name: [name] [name] MRN: [id number] DOB: 1951/01/01 Today's Date: 08/27/2024   History of Present Illness 73 yo male presents to therapy s/p R THA, anterior approach on 08/25/2024 due to failure of conservative measures. Pt is currently R LE WBAT with anterior hip precautions-- no hyperextension of hip, no external rotation and no abduction. Pt PMH includes but is not limited to: anemia, arthritis, HLD, HTN, MI, sickle cell trait, cardiac surgeries and R THA, anterior approach on 05/12/2024.    PT Comments  Patient practiced steps, has met PT goals for Dc.     If plan is discharge home, recommend the following: A little help with walking and/or transfers;A little help with bathing/dressing/bathroom;Assistance with cooking/housework;Assist for transportation;Help with stairs or ramp for entrance   Can travel by private vehicle        Equipment Recommendations  None recommended by PT    Recommendations for Other Services       Precautions / Restrictions Precautions Precautions: Fall;Anterior Hip Precaution/Restrictions Comments: no ABD, no hyperextension, no external rotation on  Left     Mobility  Bed Mobility Overal bed mobility: Needs Assistance Bed Mobility:     Supine to sit: Min assist     General bed mobility comments: in recliner    Transfers Overall transfer level: Needs assistance Equipment used: Rolling walker (2 wheels) Transfers: Sit to/from Stand Sit to Stand: Contact guard assist           General transfer comment: cues for safety    Ambulation/Gait Ambulation/Gait assistance: Contact guard assist Gait Distance (Feet): 50 Feet Assistive device: Rolling walker (2 wheels) Gait Pattern/deviations: Step-to pattern, Trunk flexed, Decreased step length - left Gait velocity: decreased     General Gait Details: cues to stay inside the Rw for safety,did not note left knee buckling,   Stairs Stairs:  Yes Stairs assistance: Min assist Stair Management: Step to pattern, One rail Left, With cane Number of Stairs: 2 (and 3) General stair comments: cues for sequence   Wheelchair Mobility     Tilt Bed    Modified Rankin (Stroke Patients Only)       Balance Overall balance assessment: Needs assistance Sitting-balance support: Feet supported Sitting balance-Leahy Scale: Good     Standing balance support: Bilateral upper extremity supported, During functional activity, Reliant on assistive device for balance Standing balance-Leahy Scale: Fair                              Hotel Manager: No apparent difficulties  Cognition Arousal: Alert Behavior During Therapy: WFL for tasks assessed/performed   PT - Cognitive impairments: No apparent impairments                         Following commands: Intact      Cueing    Exercises      General Comments        Pertinent Vitals/Pain Pain Assessment Pain Score: 4  Pain Location: left hip Pain Descriptors / Indicators: Discomfort, Jabbing Pain Intervention(s): Patient requesting pain meds-RN notified    Home Living                          Prior Function            PT Goals (current goals can now be found in the care plan section) Progress towards PT  goals: Progressing toward goals    Frequency    7X/week      PT Plan      Co-evaluation              AM-PAC PT 6 Clicks Mobility   Outcome Measure  Help needed turning from your back to your side while in a flat bed without using bedrails?: None Help needed moving from lying on your back to sitting on the side of a flat bed without using bedrails?: A Little Help needed moving to and from a bed to a chair (including a wheelchair)?: A Little Help needed standing up from a chair using your arms (e.g., wheelchair or bedside chair)?: A Little Help needed to walk in hospital room?: A Little Help  needed climbing 3-5 steps with a railing? : A Lot 6 Click Score: 18    End of Session Equipment Utilized During Treatment: Gait belt Activity Tolerance: Patient tolerated treatment well;Patient limited by pain Patient left: in chair;with call bell/phone within reach;with chair alarm set Nurse Communication: Mobility status PT Visit Diagnosis: Unsteadiness on feet (R26.81);Other abnormalities of gait and mobility (R26.89);Muscle weakness (generalized) (M62.81);Difficulty in walking, not elsewhere classified (R26.2);Pain Pain - Right/Left: Left Pain - part of body: Leg;Hip     Time: 1225-1237 PT Time Calculation (min) (ACUTE ONLY): 12 min  Charges:    $Gait Training: 8-22 mins PT General Charges $$ ACUTE PT VISIT: 1 Visit                     [name] [name] PT Acute Rehabilitation Services Office [phone]    [name] [name] 08/27/2024, 4:38 PM

## 2024-08-27 NOTE — Progress Notes (Signed)
 Physical Therapy Treatment Patient Details Name: [name] [name] MRN: [id number] DOB: 10/08/1950 Today's Date: 08/27/2024   History of Present Illness 73 yo male presents to therapy s/p R THA, anterior approach on 08/25/2024 due to failure of conservative measures. Pt is currently R LE WBAT with anterior hip precautions-- no hyperextension of hip, no external rotation and no abduction. Pt PMH includes but is not limited to: anemia, arthritis, HLD, HTN, MI, sickle cell trait, cardiac surgeries and R THA, anterior approach on 05/12/2024.    PT Comments   Patient ambulated much better with no Left knee buckling. Patient does report 7/10 pain. Patient will  get medication and PT will return for step training.     If plan is discharge home, recommend the following: A little help with walking and/or transfers;A little help with bathing/dressing/bathroom;Assistance with cooking/housework;Assist for transportation;Help with stairs or ramp for entrance   Can travel by private vehicle        Equipment Recommendations  None recommended by PT    Recommendations for Other Services       Precautions / Restrictions Precautions Precautions: Fall;Anterior Hip Precaution/Restrictions Comments: no ABD, no hyperextension, no external rotation on  Left     Mobility  Bed Mobility Overal bed mobility: Needs Assistance Bed Mobility: Supine to Sit     Supine to sit: Min assist     General bed mobility comments: use of belt on LLE, still required min support of the LLE to bed edge , patient movies slowly.    Transfers Overall transfer level: Needs assistance Equipment used: Rolling walker (2 wheels) Transfers: Sit to/from Stand Sit to Stand: Min assist           General transfer comment: steady assist, bed raised, cues for hand placement to stand    Ambulation/Gait Ambulation/Gait assistance: Min assist Gait Distance (Feet): 80 Feet Assistive device: Rolling walker (2 wheels) Gait  Pattern/deviations: Step-to pattern, Trunk flexed, Decreased step length - left Gait velocity: decreased     General Gait Details: cues to stay inside the Rw for safety,did not note left knee buckling,   Stairs             Wheelchair Mobility     Tilt Bed    Modified Rankin (Stroke Patients Only)       Balance Overall balance assessment: Needs assistance Sitting-balance support: Feet supported Sitting balance-Leahy Scale: Good     Standing balance support: Bilateral upper extremity supported, During functional activity, Reliant on assistive device for balance Standing balance-Leahy Scale: Poor                              Communication Communication Communication: No apparent difficulties  Cognition Arousal: Alert Behavior During Therapy: WFL for tasks assessed/performed   PT - Cognitive impairments: No apparent impairments                         Following commands: Intact      Cueing    Exercises      General Comments        Pertinent Vitals/Pain Pain Assessment Pain Score: 4  Pain Location: left hip Pain Descriptors / Indicators: Discomfort, Jabbing Pain Intervention(s): Patient requesting pain meds-RN notified, Monitored during session    Home Living                          Prior  Function            PT Goals (current goals can now be found in the care plan section) Progress towards PT goals: Progressing toward goals    Frequency    7X/week      PT Plan      Co-evaluation              AM-PAC PT 6 Clicks Mobility   Outcome Measure  Help needed turning from your back to your side while in a flat bed without using bedrails?: None Help needed moving from lying on your back to sitting on the side of a flat bed without using bedrails?: A Little Help needed moving to and from a bed to a chair (including a wheelchair)?: A Little Help needed standing up from a chair using your arms (e.g.,  wheelchair or bedside chair)?: A Little Help needed to walk in hospital room?: A Little Help needed climbing 3-5 steps with a railing? : A Lot 6 Click Score: 18    End of Session Equipment Utilized During Treatment: Gait belt Activity Tolerance: Patient tolerated treatment well;Patient limited by pain Patient left: in chair;with call bell/phone within reach;with chair alarm set Nurse Communication: Mobility status PT Visit Diagnosis: Unsteadiness on feet (R26.81);Other abnormalities of gait and mobility (R26.89);Muscle weakness (generalized) (M62.81);Difficulty in walking, not elsewhere classified (R26.2);Pain Pain - Right/Left: Left Pain - part of body: Leg;Hip     Time: 9089-9065 PT Time Calculation (min) (ACUTE ONLY): 24 min  Charges:    $Gait Training: 23-37 mins PT General Charges $$ ACUTE PT VISIT: 1 Visit                     [name] PT Acute Rehabilitation Services Office [phone]    Potters Darice Norris 08/27/2024, 4:32 PM

## 2024-09-02 NOTE — Op Note (Signed)
 PATIENT ID:      [name] [name]  MRN:     [id number] DOB/AGE:    1951/04/05 / 73 y.o.       OPERATIVE REPORT    DATE OF PROCEDURE:  08/25/2024     PREOPERATIVE DIAGNOSIS:  LEFT HIP DEGENERATIVE JOINT DISEASE                                                       Estimated body mass index is 25.63 kg/m as calculated from the following:   Height as of this encounter: 5' 11 (1.803 m).   Weight as of this encounter: 83.4 kg.     POSTOPERATIVE DIAGNOSIS:  LEFT HIP DEGENERATIVE JOINT DISEASE                                                           PROCEDURE:  1. left total hip arthroplasty using a 58 mm DePuy gription Cup, [employer],  +4liner, a +8.5 36 mm ceramic head,  and a #4  Actisstem, 2.interpretation of multiple intraoperative fluoroscopic images   SURGEON: [name]    ASSISTANT:   Signe Reining PA-C  (present throughout entire procedure and necessary for timely completion of the procedure)  ANESTHESIA: spinal  BLOOD LOSS: 300cc Tranexamic Acid : 1 gram IV DRAINS: None COMPLICATIONS: None    NDICATIONS FOR PROCEDURE:Patient with end-stage arthritis of the lefthip.  X-rays show bone-on-bone arthritic changes. Despite conservative measures with observation, anti-inflammatory medicine, narcotics, use of a cane, has severe unremitting pain and can ambulate only less than 1 block before resting.  Patient desires elective left total hip arthroplasty to decrease pain and increase function. The risks, benefits, and alternatives were discussed at length including but not limited to the risks of infection, bleeding, nerve injury, stiffness, blood clots, the need for revision surgery, cardiopulmonary complications, among others, and they were willing to proceed.Benefits have been discussed. Questions answered.     PROCEDURE IN DETAIL: The patient was identified by armband,  received preoperative IV antibiotics in the holding area at [facility], taken to the  operating room , appropriate anesthetic monitors  were attached and spinal anesthesia was induced.  The patient was placed onto the hot bed and all bony prominences were well-padded.The left hip was prepped and draped for an anterior approach to the hip.  An incision was made and the subcutaneous dissection was down to the level of the tensor fascia.  The fascia was opened and finger dissected.  The bleeders coming across the anterior portion of the hip were identified and cauterized. Retractors were put in place above and below the femoral neck.  The capsule was opened and tagged and a provisional neck cut was made.  The head was removed and sized on the back table.  The acetabulum was sequentially reamed to a level of 57mm and a 58 mm gription coated pinnacle cup was hammered into place with 45 of lateral opening and 30 of anteversion.fluoroscopy was used to ensure this position of the cup.  Attention was turned towards the femur where the leg was actually rotated, extended, and adduction  did.  The femur was sequentially broached until a size of 4broach gave a perfect fit and fill.at this point a  8.5 mm delta ceramic hip ball was placed and the hip reduced.  Fluoroscopic images were taken to assess the leg length, fit and fill of the stem, and cup position.  We were happy with the construct at this point.  The 4 broach was removed and a final Actis stem with standard offset  and a 8.5 mm ceramic hip ball was placed and reduced.  Final images were taken to make certain there were happy with the position at this point.   The capsule was closed with #1 Vicryl suture.  The tensor fascia was closed with 0 Vicryl suture.  The skin was then closed with combination of 0 and 2-0 Vicryl suture.  The top layer was with 3-0 Monocryl suture.  Benzoin and Steri-Strips were applied  and a sterile compressive dressing was applied and the patient taken to recovery room she noted be in satisfactory condition.  Past medical  Motion for the procedure was approximately 300 cc.  Of note Signe Reining was present the entire case and assisted by retraction of tissues, manipulation of the leg, and closing the minimize or time.    [name] 08/25/2024 Dictated 09/02/2024, 2:59 PM

## 2024-09-17 ENCOUNTER — Ambulatory Visit
Admission: RE | Admit: 2024-09-17 | Discharge: 2024-09-17 | Disposition: A | Discharge status: Home / Self Care | Source: Ambulatory Visit | Attending: Internal Medicine | Admitting: Internal Medicine

## 2024-09-17 DIAGNOSIS — Z122 Encounter for screening for malignant neoplasm of respiratory organs: Secondary | ICD-10-CM
